# Patient Record
Sex: Female | Born: 1945 | Race: White | Hispanic: No | Marital: Married | Smoking: Never smoker
Health system: Southern US, Community
[De-identification: ages and names within clinical notes are randomized; demographics above are authoritative.]

## PROBLEM LIST (undated history)

## (undated) DIAGNOSIS — I519 Heart disease, unspecified: Secondary | ICD-10-CM

## (undated) DIAGNOSIS — F419 Anxiety disorder, unspecified: Secondary | ICD-10-CM

## (undated) DIAGNOSIS — E079 Disorder of thyroid, unspecified: Secondary | ICD-10-CM

## (undated) DIAGNOSIS — R519 Headache, unspecified: Secondary | ICD-10-CM

## (undated) DIAGNOSIS — Z90710 Acquired absence of both cervix and uterus: Secondary | ICD-10-CM

## (undated) DIAGNOSIS — I517 Cardiomegaly: Secondary | ICD-10-CM

## (undated) DIAGNOSIS — E7800 Pure hypercholesterolemia, unspecified: Secondary | ICD-10-CM

## (undated) DIAGNOSIS — M1990 Unspecified osteoarthritis, unspecified site: Secondary | ICD-10-CM

## (undated) DIAGNOSIS — R51 Headache: Secondary | ICD-10-CM

## (undated) DIAGNOSIS — I10 Essential (primary) hypertension: Secondary | ICD-10-CM

## (undated) DIAGNOSIS — K219 Gastro-esophageal reflux disease without esophagitis: Secondary | ICD-10-CM

## (undated) DIAGNOSIS — E0590 Thyrotoxicosis, unspecified without thyrotoxic crisis or storm: Secondary | ICD-10-CM

## (undated) DIAGNOSIS — E785 Hyperlipidemia, unspecified: Secondary | ICD-10-CM

## (undated) DIAGNOSIS — G43909 Migraine, unspecified, not intractable, without status migrainosus: Secondary | ICD-10-CM

## (undated) HISTORY — DX: Disorder of thyroid, unspecified: E07.9

## (undated) HISTORY — DX: Hyperlipidemia, unspecified: E78.5

## (undated) HISTORY — DX: Essential (primary) hypertension: I10

## (undated) HISTORY — DX: Headache: R51

## (undated) HISTORY — DX: Gastro-esophageal reflux disease without esophagitis: K21.9

## (undated) HISTORY — DX: Migraine, unspecified, not intractable, without status migrainosus: G43.909

## (undated) HISTORY — DX: Heart disease, unspecified: I51.9

## (undated) HISTORY — DX: Cardiomegaly: I51.7

## (undated) HISTORY — DX: Pure hypercholesterolemia, unspecified: E78.00

## (undated) HISTORY — DX: Anxiety disorder, unspecified: F41.9

## (undated) HISTORY — DX: Unspecified osteoarthritis, unspecified site: M19.90

## (undated) HISTORY — DX: Headache, unspecified: R51.9

---
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
## 1977-11-23 HISTORY — PX: ABDOMINAL HYSTERECTOMY: SHX81

## 1977-11-23 HISTORY — PX: VAGINAL HYSTERECTOMY: SUR661

## 2005-03-10 ENCOUNTER — Ambulatory Visit: Payer: Self-pay | Admitting: Internal Medicine

## 2005-04-28 ENCOUNTER — Ambulatory Visit: Payer: Self-pay | Admitting: Internal Medicine

## 2006-02-15 ENCOUNTER — Ambulatory Visit: Payer: Self-pay | Admitting: Surgery

## 2006-06-01 ENCOUNTER — Ambulatory Visit: Payer: Self-pay | Admitting: Internal Medicine

## 2007-06-03 ENCOUNTER — Ambulatory Visit: Payer: Self-pay | Admitting: Internal Medicine

## 2007-08-25 ENCOUNTER — Ambulatory Visit: Payer: Self-pay | Admitting: Internal Medicine

## 2007-12-19 ENCOUNTER — Ambulatory Visit: Payer: Self-pay | Admitting: Surgery

## 2008-04-09 ENCOUNTER — Ambulatory Visit: Payer: Self-pay | Admitting: Pulmonary Disease

## 2008-04-09 DIAGNOSIS — R05 Cough: Secondary | ICD-10-CM

## 2008-04-09 DIAGNOSIS — R059 Cough, unspecified: Secondary | ICD-10-CM | POA: Insufficient documentation

## 2008-04-09 DIAGNOSIS — E785 Hyperlipidemia, unspecified: Secondary | ICD-10-CM | POA: Insufficient documentation

## 2008-06-04 ENCOUNTER — Ambulatory Visit: Payer: Self-pay | Admitting: Internal Medicine

## 2008-06-20 LAB — HM DEXA SCAN: HM Dexa Scan: NORMAL

## 2009-04-08 ENCOUNTER — Ambulatory Visit: Payer: Self-pay | Admitting: Surgery

## 2009-09-04 ENCOUNTER — Ambulatory Visit: Payer: Self-pay | Admitting: Internal Medicine

## 2009-11-04 ENCOUNTER — Ambulatory Visit: Payer: Self-pay | Admitting: Gastroenterology

## 2010-10-20 ENCOUNTER — Ambulatory Visit: Payer: Self-pay | Admitting: Internal Medicine

## 2010-10-29 ENCOUNTER — Ambulatory Visit: Payer: Self-pay | Admitting: Internal Medicine

## 2011-06-21 LAB — HM COLONOSCOPY: HM Colonoscopy: NORMAL

## 2011-11-23 ENCOUNTER — Ambulatory Visit: Payer: Self-pay | Admitting: Obstetrics and Gynecology

## 2012-01-26 ENCOUNTER — Ambulatory Visit: Payer: Self-pay | Admitting: Specialist

## 2012-06-20 DIAGNOSIS — Z79899 Other long term (current) drug therapy: Secondary | ICD-10-CM | POA: Diagnosis not present

## 2012-06-20 DIAGNOSIS — E785 Hyperlipidemia, unspecified: Secondary | ICD-10-CM | POA: Diagnosis not present

## 2012-06-20 DIAGNOSIS — E559 Vitamin D deficiency, unspecified: Secondary | ICD-10-CM | POA: Diagnosis not present

## 2012-06-20 DIAGNOSIS — E039 Hypothyroidism, unspecified: Secondary | ICD-10-CM | POA: Diagnosis not present

## 2012-06-27 DIAGNOSIS — E785 Hyperlipidemia, unspecified: Secondary | ICD-10-CM | POA: Diagnosis not present

## 2012-06-27 DIAGNOSIS — N390 Urinary tract infection, site not specified: Secondary | ICD-10-CM | POA: Diagnosis not present

## 2012-06-27 DIAGNOSIS — I10 Essential (primary) hypertension: Secondary | ICD-10-CM | POA: Diagnosis not present

## 2012-06-27 DIAGNOSIS — E039 Hypothyroidism, unspecified: Secondary | ICD-10-CM | POA: Diagnosis not present

## 2012-08-24 DIAGNOSIS — Z23 Encounter for immunization: Secondary | ICD-10-CM | POA: Diagnosis not present

## 2012-10-17 DIAGNOSIS — D239 Other benign neoplasm of skin, unspecified: Secondary | ICD-10-CM | POA: Diagnosis not present

## 2012-10-17 DIAGNOSIS — B079 Viral wart, unspecified: Secondary | ICD-10-CM | POA: Diagnosis not present

## 2012-10-17 DIAGNOSIS — L821 Other seborrheic keratosis: Secondary | ICD-10-CM | POA: Diagnosis not present

## 2012-12-06 DIAGNOSIS — J209 Acute bronchitis, unspecified: Secondary | ICD-10-CM | POA: Diagnosis not present

## 2012-12-26 DIAGNOSIS — I10 Essential (primary) hypertension: Secondary | ICD-10-CM | POA: Diagnosis not present

## 2012-12-26 DIAGNOSIS — E039 Hypothyroidism, unspecified: Secondary | ICD-10-CM | POA: Diagnosis not present

## 2012-12-26 DIAGNOSIS — E785 Hyperlipidemia, unspecified: Secondary | ICD-10-CM | POA: Diagnosis not present

## 2012-12-26 DIAGNOSIS — R197 Diarrhea, unspecified: Secondary | ICD-10-CM | POA: Diagnosis not present

## 2012-12-28 ENCOUNTER — Ambulatory Visit: Payer: Self-pay

## 2012-12-28 DIAGNOSIS — Z1231 Encounter for screening mammogram for malignant neoplasm of breast: Secondary | ICD-10-CM | POA: Diagnosis not present

## 2013-02-13 DIAGNOSIS — L290 Pruritus ani: Secondary | ICD-10-CM | POA: Diagnosis not present

## 2013-02-13 DIAGNOSIS — Z1211 Encounter for screening for malignant neoplasm of colon: Secondary | ICD-10-CM | POA: Diagnosis not present

## 2013-02-21 ENCOUNTER — Ambulatory Visit: Payer: Self-pay | Admitting: Gastroenterology

## 2013-02-21 DIAGNOSIS — R21 Rash and other nonspecific skin eruption: Secondary | ICD-10-CM | POA: Diagnosis not present

## 2013-02-21 DIAGNOSIS — G4730 Sleep apnea, unspecified: Secondary | ICD-10-CM | POA: Diagnosis not present

## 2013-02-21 DIAGNOSIS — E039 Hypothyroidism, unspecified: Secondary | ICD-10-CM | POA: Diagnosis not present

## 2013-02-21 DIAGNOSIS — D510 Vitamin B12 deficiency anemia due to intrinsic factor deficiency: Secondary | ICD-10-CM | POA: Diagnosis not present

## 2013-02-21 DIAGNOSIS — K5732 Diverticulitis of large intestine without perforation or abscess without bleeding: Secondary | ICD-10-CM | POA: Diagnosis not present

## 2013-02-21 DIAGNOSIS — E785 Hyperlipidemia, unspecified: Secondary | ICD-10-CM | POA: Diagnosis not present

## 2013-02-21 DIAGNOSIS — K5730 Diverticulosis of large intestine without perforation or abscess without bleeding: Secondary | ICD-10-CM | POA: Diagnosis not present

## 2013-02-21 DIAGNOSIS — Z1211 Encounter for screening for malignant neoplasm of colon: Secondary | ICD-10-CM | POA: Diagnosis not present

## 2013-02-21 DIAGNOSIS — Q438 Other specified congenital malformations of intestine: Secondary | ICD-10-CM | POA: Diagnosis not present

## 2013-02-21 DIAGNOSIS — Z79899 Other long term (current) drug therapy: Secondary | ICD-10-CM | POA: Diagnosis not present

## 2013-02-21 DIAGNOSIS — I10 Essential (primary) hypertension: Secondary | ICD-10-CM | POA: Diagnosis not present

## 2013-04-03 DIAGNOSIS — B379 Candidiasis, unspecified: Secondary | ICD-10-CM | POA: Diagnosis not present

## 2013-05-15 DIAGNOSIS — H43819 Vitreous degeneration, unspecified eye: Secondary | ICD-10-CM | POA: Diagnosis not present

## 2013-05-15 DIAGNOSIS — H2510 Age-related nuclear cataract, unspecified eye: Secondary | ICD-10-CM | POA: Diagnosis not present

## 2013-06-20 LAB — HM MAMMOGRAPHY: HM Mammogram: NORMAL

## 2013-07-03 DIAGNOSIS — E785 Hyperlipidemia, unspecified: Secondary | ICD-10-CM | POA: Diagnosis not present

## 2013-07-03 DIAGNOSIS — I10 Essential (primary) hypertension: Secondary | ICD-10-CM | POA: Diagnosis not present

## 2013-07-03 DIAGNOSIS — E039 Hypothyroidism, unspecified: Secondary | ICD-10-CM | POA: Diagnosis not present

## 2013-07-03 DIAGNOSIS — Z0000 Encounter for general adult medical examination without abnormal findings: Secondary | ICD-10-CM | POA: Diagnosis not present

## 2013-07-10 DIAGNOSIS — E039 Hypothyroidism, unspecified: Secondary | ICD-10-CM | POA: Diagnosis not present

## 2013-07-10 DIAGNOSIS — Z0000 Encounter for general adult medical examination without abnormal findings: Secondary | ICD-10-CM | POA: Diagnosis not present

## 2013-07-10 DIAGNOSIS — E785 Hyperlipidemia, unspecified: Secondary | ICD-10-CM | POA: Diagnosis not present

## 2013-07-10 DIAGNOSIS — Z23 Encounter for immunization: Secondary | ICD-10-CM | POA: Diagnosis not present

## 2013-07-17 DIAGNOSIS — E8941 Symptomatic postprocedural ovarian failure: Secondary | ICD-10-CM | POA: Diagnosis not present

## 2013-08-16 DIAGNOSIS — L84 Corns and callosities: Secondary | ICD-10-CM | POA: Diagnosis not present

## 2013-08-16 DIAGNOSIS — L259 Unspecified contact dermatitis, unspecified cause: Secondary | ICD-10-CM | POA: Diagnosis not present

## 2013-08-16 DIAGNOSIS — Z419 Encounter for procedure for purposes other than remedying health state, unspecified: Secondary | ICD-10-CM | POA: Diagnosis not present

## 2013-09-06 DIAGNOSIS — Z23 Encounter for immunization: Secondary | ICD-10-CM | POA: Diagnosis not present

## 2013-10-02 DIAGNOSIS — E039 Hypothyroidism, unspecified: Secondary | ICD-10-CM | POA: Diagnosis not present

## 2013-10-02 DIAGNOSIS — E7800 Pure hypercholesterolemia, unspecified: Secondary | ICD-10-CM | POA: Diagnosis not present

## 2013-10-09 DIAGNOSIS — R059 Cough, unspecified: Secondary | ICD-10-CM | POA: Diagnosis not present

## 2013-10-09 DIAGNOSIS — F329 Major depressive disorder, single episode, unspecified: Secondary | ICD-10-CM | POA: Diagnosis not present

## 2013-10-09 DIAGNOSIS — E039 Hypothyroidism, unspecified: Secondary | ICD-10-CM | POA: Diagnosis not present

## 2013-10-09 DIAGNOSIS — R05 Cough: Secondary | ICD-10-CM | POA: Diagnosis not present

## 2013-10-09 DIAGNOSIS — E785 Hyperlipidemia, unspecified: Secondary | ICD-10-CM | POA: Diagnosis not present

## 2013-12-04 DIAGNOSIS — E039 Hypothyroidism, unspecified: Secondary | ICD-10-CM | POA: Diagnosis not present

## 2013-12-25 DIAGNOSIS — L909 Atrophic disorder of skin, unspecified: Secondary | ICD-10-CM | POA: Diagnosis not present

## 2013-12-25 DIAGNOSIS — L84 Corns and callosities: Secondary | ICD-10-CM | POA: Diagnosis not present

## 2013-12-25 DIAGNOSIS — Z1283 Encounter for screening for malignant neoplasm of skin: Secondary | ICD-10-CM | POA: Diagnosis not present

## 2013-12-25 DIAGNOSIS — L919 Hypertrophic disorder of the skin, unspecified: Secondary | ICD-10-CM | POA: Diagnosis not present

## 2013-12-25 DIAGNOSIS — L819 Disorder of pigmentation, unspecified: Secondary | ICD-10-CM | POA: Diagnosis not present

## 2014-01-17 DIAGNOSIS — E039 Hypothyroidism, unspecified: Secondary | ICD-10-CM | POA: Diagnosis not present

## 2014-02-14 DIAGNOSIS — I10 Essential (primary) hypertension: Secondary | ICD-10-CM | POA: Diagnosis not present

## 2014-02-14 DIAGNOSIS — E213 Hyperparathyroidism, unspecified: Secondary | ICD-10-CM | POA: Diagnosis not present

## 2014-03-08 DIAGNOSIS — E039 Hypothyroidism, unspecified: Secondary | ICD-10-CM | POA: Diagnosis not present

## 2014-03-08 DIAGNOSIS — N390 Urinary tract infection, site not specified: Secondary | ICD-10-CM | POA: Diagnosis not present

## 2014-03-26 DIAGNOSIS — E039 Hypothyroidism, unspecified: Secondary | ICD-10-CM | POA: Diagnosis not present

## 2014-04-02 DIAGNOSIS — M549 Dorsalgia, unspecified: Secondary | ICD-10-CM | POA: Diagnosis not present

## 2014-04-05 DIAGNOSIS — K5900 Constipation, unspecified: Secondary | ICD-10-CM | POA: Diagnosis not present

## 2014-04-05 DIAGNOSIS — M5137 Other intervertebral disc degeneration, lumbosacral region: Secondary | ICD-10-CM | POA: Diagnosis not present

## 2014-04-05 DIAGNOSIS — M5430 Sciatica, unspecified side: Secondary | ICD-10-CM | POA: Diagnosis not present

## 2014-04-10 DIAGNOSIS — M542 Cervicalgia: Secondary | ICD-10-CM | POA: Diagnosis not present

## 2014-04-10 DIAGNOSIS — M546 Pain in thoracic spine: Secondary | ICD-10-CM | POA: Diagnosis not present

## 2014-04-10 DIAGNOSIS — M545 Low back pain, unspecified: Secondary | ICD-10-CM | POA: Diagnosis not present

## 2014-04-10 DIAGNOSIS — M999 Biomechanical lesion, unspecified: Secondary | ICD-10-CM | POA: Diagnosis not present

## 2014-04-10 DIAGNOSIS — M5137 Other intervertebral disc degeneration, lumbosacral region: Secondary | ICD-10-CM | POA: Diagnosis not present

## 2014-04-10 DIAGNOSIS — M9981 Other biomechanical lesions of cervical region: Secondary | ICD-10-CM | POA: Diagnosis not present

## 2014-04-11 DIAGNOSIS — M545 Low back pain, unspecified: Secondary | ICD-10-CM | POA: Diagnosis not present

## 2014-04-11 DIAGNOSIS — M546 Pain in thoracic spine: Secondary | ICD-10-CM | POA: Diagnosis not present

## 2014-04-11 DIAGNOSIS — M542 Cervicalgia: Secondary | ICD-10-CM | POA: Diagnosis not present

## 2014-04-11 DIAGNOSIS — M5137 Other intervertebral disc degeneration, lumbosacral region: Secondary | ICD-10-CM | POA: Diagnosis not present

## 2014-04-11 DIAGNOSIS — M999 Biomechanical lesion, unspecified: Secondary | ICD-10-CM | POA: Diagnosis not present

## 2014-04-11 DIAGNOSIS — M9981 Other biomechanical lesions of cervical region: Secondary | ICD-10-CM | POA: Diagnosis not present

## 2014-04-13 DIAGNOSIS — M9981 Other biomechanical lesions of cervical region: Secondary | ICD-10-CM | POA: Diagnosis not present

## 2014-04-13 DIAGNOSIS — M542 Cervicalgia: Secondary | ICD-10-CM | POA: Diagnosis not present

## 2014-04-13 DIAGNOSIS — M999 Biomechanical lesion, unspecified: Secondary | ICD-10-CM | POA: Diagnosis not present

## 2014-04-13 DIAGNOSIS — M545 Low back pain, unspecified: Secondary | ICD-10-CM | POA: Diagnosis not present

## 2014-04-13 DIAGNOSIS — M546 Pain in thoracic spine: Secondary | ICD-10-CM | POA: Diagnosis not present

## 2014-04-13 DIAGNOSIS — M5137 Other intervertebral disc degeneration, lumbosacral region: Secondary | ICD-10-CM | POA: Diagnosis not present

## 2014-04-18 DIAGNOSIS — M999 Biomechanical lesion, unspecified: Secondary | ICD-10-CM | POA: Diagnosis not present

## 2014-04-18 DIAGNOSIS — M545 Low back pain, unspecified: Secondary | ICD-10-CM | POA: Diagnosis not present

## 2014-04-18 DIAGNOSIS — M546 Pain in thoracic spine: Secondary | ICD-10-CM | POA: Diagnosis not present

## 2014-04-18 DIAGNOSIS — M5137 Other intervertebral disc degeneration, lumbosacral region: Secondary | ICD-10-CM | POA: Diagnosis not present

## 2014-04-18 DIAGNOSIS — M9981 Other biomechanical lesions of cervical region: Secondary | ICD-10-CM | POA: Diagnosis not present

## 2014-04-18 DIAGNOSIS — M542 Cervicalgia: Secondary | ICD-10-CM | POA: Diagnosis not present

## 2014-04-20 DIAGNOSIS — M9981 Other biomechanical lesions of cervical region: Secondary | ICD-10-CM | POA: Diagnosis not present

## 2014-04-20 DIAGNOSIS — M545 Low back pain, unspecified: Secondary | ICD-10-CM | POA: Diagnosis not present

## 2014-04-20 DIAGNOSIS — M999 Biomechanical lesion, unspecified: Secondary | ICD-10-CM | POA: Diagnosis not present

## 2014-04-20 DIAGNOSIS — M5137 Other intervertebral disc degeneration, lumbosacral region: Secondary | ICD-10-CM | POA: Diagnosis not present

## 2014-04-20 DIAGNOSIS — M546 Pain in thoracic spine: Secondary | ICD-10-CM | POA: Diagnosis not present

## 2014-04-20 DIAGNOSIS — M542 Cervicalgia: Secondary | ICD-10-CM | POA: Diagnosis not present

## 2014-04-23 DIAGNOSIS — M545 Low back pain, unspecified: Secondary | ICD-10-CM | POA: Diagnosis not present

## 2014-04-23 DIAGNOSIS — M9981 Other biomechanical lesions of cervical region: Secondary | ICD-10-CM | POA: Diagnosis not present

## 2014-04-23 DIAGNOSIS — M5137 Other intervertebral disc degeneration, lumbosacral region: Secondary | ICD-10-CM | POA: Diagnosis not present

## 2014-04-23 DIAGNOSIS — M546 Pain in thoracic spine: Secondary | ICD-10-CM | POA: Diagnosis not present

## 2014-04-23 DIAGNOSIS — M999 Biomechanical lesion, unspecified: Secondary | ICD-10-CM | POA: Diagnosis not present

## 2014-04-23 DIAGNOSIS — M542 Cervicalgia: Secondary | ICD-10-CM | POA: Diagnosis not present

## 2014-04-25 DIAGNOSIS — M999 Biomechanical lesion, unspecified: Secondary | ICD-10-CM | POA: Diagnosis not present

## 2014-04-25 DIAGNOSIS — M9981 Other biomechanical lesions of cervical region: Secondary | ICD-10-CM | POA: Diagnosis not present

## 2014-04-25 DIAGNOSIS — M5137 Other intervertebral disc degeneration, lumbosacral region: Secondary | ICD-10-CM | POA: Diagnosis not present

## 2014-04-25 DIAGNOSIS — M545 Low back pain, unspecified: Secondary | ICD-10-CM | POA: Diagnosis not present

## 2014-04-25 DIAGNOSIS — M546 Pain in thoracic spine: Secondary | ICD-10-CM | POA: Diagnosis not present

## 2014-04-25 DIAGNOSIS — M542 Cervicalgia: Secondary | ICD-10-CM | POA: Diagnosis not present

## 2014-04-27 DIAGNOSIS — M545 Low back pain, unspecified: Secondary | ICD-10-CM | POA: Diagnosis not present

## 2014-04-27 DIAGNOSIS — M999 Biomechanical lesion, unspecified: Secondary | ICD-10-CM | POA: Diagnosis not present

## 2014-04-27 DIAGNOSIS — M9981 Other biomechanical lesions of cervical region: Secondary | ICD-10-CM | POA: Diagnosis not present

## 2014-04-27 DIAGNOSIS — M5137 Other intervertebral disc degeneration, lumbosacral region: Secondary | ICD-10-CM | POA: Diagnosis not present

## 2014-04-27 DIAGNOSIS — M546 Pain in thoracic spine: Secondary | ICD-10-CM | POA: Diagnosis not present

## 2014-04-27 DIAGNOSIS — M542 Cervicalgia: Secondary | ICD-10-CM | POA: Diagnosis not present

## 2014-04-30 DIAGNOSIS — M9981 Other biomechanical lesions of cervical region: Secondary | ICD-10-CM | POA: Diagnosis not present

## 2014-04-30 DIAGNOSIS — M999 Biomechanical lesion, unspecified: Secondary | ICD-10-CM | POA: Diagnosis not present

## 2014-04-30 DIAGNOSIS — M5137 Other intervertebral disc degeneration, lumbosacral region: Secondary | ICD-10-CM | POA: Diagnosis not present

## 2014-04-30 DIAGNOSIS — M545 Low back pain, unspecified: Secondary | ICD-10-CM | POA: Diagnosis not present

## 2014-04-30 DIAGNOSIS — M542 Cervicalgia: Secondary | ICD-10-CM | POA: Diagnosis not present

## 2014-04-30 DIAGNOSIS — M546 Pain in thoracic spine: Secondary | ICD-10-CM | POA: Diagnosis not present

## 2014-05-02 DIAGNOSIS — M545 Low back pain, unspecified: Secondary | ICD-10-CM | POA: Diagnosis not present

## 2014-05-02 DIAGNOSIS — M999 Biomechanical lesion, unspecified: Secondary | ICD-10-CM | POA: Diagnosis not present

## 2014-05-02 DIAGNOSIS — M546 Pain in thoracic spine: Secondary | ICD-10-CM | POA: Diagnosis not present

## 2014-05-02 DIAGNOSIS — M9981 Other biomechanical lesions of cervical region: Secondary | ICD-10-CM | POA: Diagnosis not present

## 2014-05-02 DIAGNOSIS — M542 Cervicalgia: Secondary | ICD-10-CM | POA: Diagnosis not present

## 2014-05-02 DIAGNOSIS — M5137 Other intervertebral disc degeneration, lumbosacral region: Secondary | ICD-10-CM | POA: Diagnosis not present

## 2014-05-04 DIAGNOSIS — M9981 Other biomechanical lesions of cervical region: Secondary | ICD-10-CM | POA: Diagnosis not present

## 2014-05-04 DIAGNOSIS — M545 Low back pain, unspecified: Secondary | ICD-10-CM | POA: Diagnosis not present

## 2014-05-04 DIAGNOSIS — M999 Biomechanical lesion, unspecified: Secondary | ICD-10-CM | POA: Diagnosis not present

## 2014-05-04 DIAGNOSIS — M5137 Other intervertebral disc degeneration, lumbosacral region: Secondary | ICD-10-CM | POA: Diagnosis not present

## 2014-05-04 DIAGNOSIS — M542 Cervicalgia: Secondary | ICD-10-CM | POA: Diagnosis not present

## 2014-05-04 DIAGNOSIS — M546 Pain in thoracic spine: Secondary | ICD-10-CM | POA: Diagnosis not present

## 2014-05-07 DIAGNOSIS — M546 Pain in thoracic spine: Secondary | ICD-10-CM | POA: Diagnosis not present

## 2014-05-07 DIAGNOSIS — M999 Biomechanical lesion, unspecified: Secondary | ICD-10-CM | POA: Diagnosis not present

## 2014-05-07 DIAGNOSIS — M545 Low back pain, unspecified: Secondary | ICD-10-CM | POA: Diagnosis not present

## 2014-05-07 DIAGNOSIS — M5137 Other intervertebral disc degeneration, lumbosacral region: Secondary | ICD-10-CM | POA: Diagnosis not present

## 2014-05-07 DIAGNOSIS — M542 Cervicalgia: Secondary | ICD-10-CM | POA: Diagnosis not present

## 2014-05-07 DIAGNOSIS — M9981 Other biomechanical lesions of cervical region: Secondary | ICD-10-CM | POA: Diagnosis not present

## 2014-05-09 DIAGNOSIS — M999 Biomechanical lesion, unspecified: Secondary | ICD-10-CM | POA: Diagnosis not present

## 2014-05-09 DIAGNOSIS — M545 Low back pain, unspecified: Secondary | ICD-10-CM | POA: Diagnosis not present

## 2014-05-09 DIAGNOSIS — M5137 Other intervertebral disc degeneration, lumbosacral region: Secondary | ICD-10-CM | POA: Diagnosis not present

## 2014-05-09 DIAGNOSIS — M546 Pain in thoracic spine: Secondary | ICD-10-CM | POA: Diagnosis not present

## 2014-05-09 DIAGNOSIS — M542 Cervicalgia: Secondary | ICD-10-CM | POA: Diagnosis not present

## 2014-05-09 DIAGNOSIS — M9981 Other biomechanical lesions of cervical region: Secondary | ICD-10-CM | POA: Diagnosis not present

## 2014-05-11 DIAGNOSIS — M999 Biomechanical lesion, unspecified: Secondary | ICD-10-CM | POA: Diagnosis not present

## 2014-05-11 DIAGNOSIS — M9981 Other biomechanical lesions of cervical region: Secondary | ICD-10-CM | POA: Diagnosis not present

## 2014-05-11 DIAGNOSIS — M5137 Other intervertebral disc degeneration, lumbosacral region: Secondary | ICD-10-CM | POA: Diagnosis not present

## 2014-05-11 DIAGNOSIS — M545 Low back pain, unspecified: Secondary | ICD-10-CM | POA: Diagnosis not present

## 2014-05-11 DIAGNOSIS — M542 Cervicalgia: Secondary | ICD-10-CM | POA: Diagnosis not present

## 2014-05-11 DIAGNOSIS — M546 Pain in thoracic spine: Secondary | ICD-10-CM | POA: Diagnosis not present

## 2014-05-14 DIAGNOSIS — M9981 Other biomechanical lesions of cervical region: Secondary | ICD-10-CM | POA: Diagnosis not present

## 2014-05-14 DIAGNOSIS — M545 Low back pain, unspecified: Secondary | ICD-10-CM | POA: Diagnosis not present

## 2014-05-14 DIAGNOSIS — M542 Cervicalgia: Secondary | ICD-10-CM | POA: Diagnosis not present

## 2014-05-14 DIAGNOSIS — M5137 Other intervertebral disc degeneration, lumbosacral region: Secondary | ICD-10-CM | POA: Diagnosis not present

## 2014-05-14 DIAGNOSIS — M999 Biomechanical lesion, unspecified: Secondary | ICD-10-CM | POA: Diagnosis not present

## 2014-05-14 DIAGNOSIS — M546 Pain in thoracic spine: Secondary | ICD-10-CM | POA: Diagnosis not present

## 2014-05-15 DIAGNOSIS — H00029 Hordeolum internum unspecified eye, unspecified eyelid: Secondary | ICD-10-CM | POA: Diagnosis not present

## 2014-05-16 DIAGNOSIS — M999 Biomechanical lesion, unspecified: Secondary | ICD-10-CM | POA: Diagnosis not present

## 2014-05-16 DIAGNOSIS — M545 Low back pain, unspecified: Secondary | ICD-10-CM | POA: Diagnosis not present

## 2014-05-16 DIAGNOSIS — M542 Cervicalgia: Secondary | ICD-10-CM | POA: Diagnosis not present

## 2014-05-16 DIAGNOSIS — M546 Pain in thoracic spine: Secondary | ICD-10-CM | POA: Diagnosis not present

## 2014-05-16 DIAGNOSIS — M9981 Other biomechanical lesions of cervical region: Secondary | ICD-10-CM | POA: Diagnosis not present

## 2014-05-16 DIAGNOSIS — M5137 Other intervertebral disc degeneration, lumbosacral region: Secondary | ICD-10-CM | POA: Diagnosis not present

## 2014-05-21 DIAGNOSIS — N390 Urinary tract infection, site not specified: Secondary | ICD-10-CM | POA: Diagnosis not present

## 2014-05-21 DIAGNOSIS — IMO0001 Reserved for inherently not codable concepts without codable children: Secondary | ICD-10-CM | POA: Diagnosis not present

## 2014-05-21 DIAGNOSIS — E039 Hypothyroidism, unspecified: Secondary | ICD-10-CM | POA: Diagnosis not present

## 2014-05-25 DIAGNOSIS — M5137 Other intervertebral disc degeneration, lumbosacral region: Secondary | ICD-10-CM | POA: Diagnosis not present

## 2014-05-25 DIAGNOSIS — M999 Biomechanical lesion, unspecified: Secondary | ICD-10-CM | POA: Diagnosis not present

## 2014-05-25 DIAGNOSIS — M9981 Other biomechanical lesions of cervical region: Secondary | ICD-10-CM | POA: Diagnosis not present

## 2014-05-25 DIAGNOSIS — M545 Low back pain, unspecified: Secondary | ICD-10-CM | POA: Diagnosis not present

## 2014-05-25 DIAGNOSIS — M546 Pain in thoracic spine: Secondary | ICD-10-CM | POA: Diagnosis not present

## 2014-05-25 DIAGNOSIS — M542 Cervicalgia: Secondary | ICD-10-CM | POA: Diagnosis not present

## 2014-05-28 ENCOUNTER — Ambulatory Visit: Payer: Self-pay | Admitting: Internal Medicine

## 2014-06-20 ENCOUNTER — Encounter: Payer: Self-pay | Admitting: Internal Medicine

## 2014-06-20 ENCOUNTER — Encounter (INDEPENDENT_AMBULATORY_CARE_PROVIDER_SITE_OTHER): Payer: Self-pay

## 2014-06-20 ENCOUNTER — Ambulatory Visit (INDEPENDENT_AMBULATORY_CARE_PROVIDER_SITE_OTHER): Payer: Medicare Other | Admitting: Internal Medicine

## 2014-06-20 VITALS — BP 148/76 | HR 66 | Temp 98.0°F | Resp 16 | Ht 66.5 in | Wt 217.0 lb

## 2014-06-20 DIAGNOSIS — E559 Vitamin D deficiency, unspecified: Secondary | ICD-10-CM | POA: Diagnosis not present

## 2014-06-20 DIAGNOSIS — R5383 Other fatigue: Secondary | ICD-10-CM

## 2014-06-20 DIAGNOSIS — E538 Deficiency of other specified B group vitamins: Secondary | ICD-10-CM

## 2014-06-20 DIAGNOSIS — E890 Postprocedural hypothyroidism: Secondary | ICD-10-CM

## 2014-06-20 DIAGNOSIS — E785 Hyperlipidemia, unspecified: Secondary | ICD-10-CM

## 2014-06-20 DIAGNOSIS — G4733 Obstructive sleep apnea (adult) (pediatric): Secondary | ICD-10-CM

## 2014-06-20 DIAGNOSIS — R5381 Other malaise: Secondary | ICD-10-CM

## 2014-06-20 DIAGNOSIS — E669 Obesity, unspecified: Secondary | ICD-10-CM

## 2014-06-20 DIAGNOSIS — Z1239 Encounter for other screening for malignant neoplasm of breast: Secondary | ICD-10-CM

## 2014-06-20 DIAGNOSIS — Z79899 Other long term (current) drug therapy: Secondary | ICD-10-CM | POA: Diagnosis not present

## 2014-06-20 DIAGNOSIS — E039 Hypothyroidism, unspecified: Secondary | ICD-10-CM

## 2014-06-20 LAB — VITAMIN D 25 HYDROXY (VIT D DEFICIENCY, FRACTURES): VITD: 22.29 ng/mL — ABNORMAL LOW (ref 30.00–100.00)

## 2014-06-20 LAB — COMPREHENSIVE METABOLIC PANEL
ALT: 23 U/L (ref 0–35)
AST: 21 U/L (ref 0–37)
Albumin: 4.2 g/dL (ref 3.5–5.2)
Alkaline Phosphatase: 67 U/L (ref 39–117)
BUN: 16 mg/dL (ref 6–23)
CALCIUM: 9.3 mg/dL (ref 8.4–10.5)
CHLORIDE: 101 meq/L (ref 96–112)
CO2: 27 meq/L (ref 19–32)
Creatinine, Ser: 0.7 mg/dL (ref 0.4–1.2)
GFR: 96.62 mL/min (ref >60.00)
Glucose, Bld: 94 mg/dL (ref 70–99)
POTASSIUM: 4.2 meq/L (ref 3.5–5.1)
SODIUM: 139 meq/L (ref 135–145)
TOTAL PROTEIN: 7.1 g/dL (ref 6.0–8.3)
Total Bilirubin: 1.3 mg/dL — ABNORMAL HIGH (ref 0.2–1.2)

## 2014-06-20 LAB — VITAMIN B12: VITAMIN B 12: 152 pg/mL — AB (ref 211–911)

## 2014-06-20 LAB — MAGNESIUM: MAGNESIUM: 1.8 mg/dL (ref 1.5–2.5)

## 2014-06-20 LAB — TSH: TSH: 0.22 u[IU]/mL — ABNORMAL LOW (ref 0.35–4.50)

## 2014-06-20 MED ORDER — ZOSTER VACCINE LIVE 19400 UNT/0.65ML ~~LOC~~ SOLR: 0.6500 mL | Freq: Once | SUBCUTANEOUS | Status: DC

## 2014-06-20 NOTE — Progress Notes (Signed)
Pre-visit discussion using our clinic review tool. No additional management support is needed unless otherwise documented below in the visit note.  

## 2014-06-20 NOTE — Patient Instructions (Signed)
It was such a pleasure meeting your today!!  We will contact you with your labs results in the next 48 hours  We will schedule you annual wellness exam in 3 months  This is  One version of a  "Low GI"  Diet:  It will still lower your blood sugars and allow you to lose 4 to 8  lbs  per month if you follow it carefully.  Your goal with exercise is a minimum of 30 minutes of aerobic exercise 5 days per week (Walking does not count once it becomes easy!)    All of the foods can be found at grocery stores and in bulk at [employer].  The Atkins protein bars and shakes are available in more varieties at [employer], [employer] and [employer].     7 AM Breakfast:  Choose from the following:  Low carbohydrate Protein  Shakes (I recommend the EAS AdvantEdge "Carb Control" shakes  Or the low carb shakes by Atkins.    2.5 carbs   Arnold's "Sandwhich Thin"toasted  w/ peanut butter (no jelly: about 20 net carbs  "Bagel Thin" with cream cheese and salmon: about 20 carbs   a scrambled egg/bacon/cheese burrito made with Mission's "carb balance" whole wheat tortilla  (about 10 net carbs )   Avoid cereal and bananas, oatmeal and cream of wheat and grits. They are loaded with carbohydrates!   10 AM: high protein snack  Protein bar by Atkins (the snack size, under 200 cal, usually < 6 net carbs).    A stick of cheese:  Around 1 carb,  100 cal     Dannon Light n Fit [place] Yogurt  (80 cal, 8 carbs)  Other so called "protein bars" and Greek yogurts tend to be loaded with carbohydrates.  Remember, in food advertising, the word "energy" is synonymous for " carbohydrate."  Lunch:   A Sandwich using the bread choices listed, Can use any  Eggs,  lunchmeat, grilled meat or canned tuna), avocado, regular mayo/mustard  and cheese.  A Salad using blue cheese, ranch,  Goddess or vinagrette,  No croutons or "confetti" and no "candied nuts" but regular nuts OK.   No pretzels or chips.  Pickles and miniature sweet peppers are a  good low carb alternative that provide a "crunch"  The bread is the only source of carbohydrate in a sandwich and  can be decreased by trying some of these alternatives to traditional loaf bread  Joseph's makes a pita bread and a flat bread that are 50 cal and 4 net carbs available at [place] and [place].  This can be toasted to use with hummous as well  Toufayan makes a low carb flatbread that's 100 cal and 9 net carbs available at [employer] and [employer]J's makes 2 sizes of  Low carb whole wheat tortilla  (The large one is 210 cal and 6 net carbs) Avoid "Low fat dressings, as well as Barry Brunner and Wynne dressings They are loaded with sugar!   3 PM/ Mid day  Snack:  Consider  1 ounce of  almonds, walnuts, pistachios, pecans, peanuts,  Macadamia nuts or a nut medley.  Avoid "granola"; the dried cranberries and raisins are loaded with carbohydrates. Mixed nuts as long as there are no raisins,  cranberries or dried fruit.    Try the prosciutto/mozzarella cheese sticks by Fiorruci  In deli /backery section   High protein      6 PM  Dinner:  Meat/fowl/fish with a green salad, and either broccoli, cauliflower, green beans, spinach, brussel sprouts or  Lima beans. DO NOT BREAD THE PROTEIN!!      There is a low carb pasta by Dreamfield's that is acceptable and tastes great: only 5 digestible carbs/serving.( All grocery stores but BJs carry it )  Try Hurley Cisco Angelo's chicken piccata or chicken or eggplant parm over low carb pasta.(Lowes and BJs)   Marjory Lies Sanchez's "Carnitas" (pulled pork, no sauce,  0 carbs) or his beef pot roast to make a dinner burrito (at BJ's)  Pesto over low carb pasta (bj's sells a good quality pesto in the center refrigerated section of the deli   Try satueeing  Cheral Marker with mushroooms  Whole wheat pasta is still full of digestible carbs and  Not as low in glycemic index as Dreamfield's.   Brown rice is still rice,  So skip the rice and noodles if you eat [place] or  [place] (or at least limit to 1/2 cup)  9 PM snack :   Breyer's "low carb" fudgsicle or  ice cream bar (Carb Smart line), or  Weight Watcher's ice cream bar , or another "no sugar added" ice cream;  a serving of fresh berries/cherries with whipped cream   Cheese or DANNON'S LlGHT N FIT G[place] YOGURT  8 ounces of [place] unsweetened almond/cococunut milk    Avoid bananas, pineapple, grapes  and watermelon on a regular basis because they are high in sugar.  THINK OF THEM AS DESSERT  Remember that snack Substitutions should be less than 10 NET carbs per serving and meals < 20 carbs. Remember to subtract fiber grams to get the "net carbs."

## 2014-06-20 NOTE — Progress Notes (Signed)
Patient ID: Julie Perez, female   DOB: 04/15/1946, 68 y.o.   MRN: [id number]   Patient Active Problem List   Diagnosis Date Noted  . B12 deficiency 06/22/2014  . Unspecified vitamin D deficiency 06/22/2014  . Other malaise and fatigue 06/22/2014  . Obesity, unspecified 06/22/2014  . OSA (obstructive sleep apnea) 06/22/2014  . 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 HYPERLIPIDEMIA 04/09/2008  . COUGH 04/09/2008    Subjective:  CC:   Chief Complaint  Patient presents with  . Establish Care    HPI:   Julie K Piersonis a 68 y.o. female who presents  With the cc of   Fatigue: Feels tired and exhausted all of the time.  Denies dyspnea, orthopnea, chest pain with exertion. Multiple contributing factors.    Thyroid problems. For the past  6 months her dose has been changed repeatedly.  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  Has been using 150 mcg daily of generic Synthroid ( Abbott/Abbvie)   For the past 6 weeks.  Has had dose changed 3 times in the last 6 months.     Weight gain:  Has gained 30 to 40 lbs in one year. Joined  [employer] a year ago but didn't start going until yesterday .  Diet is inconsistent ,  Avoids fast food  And sodas.      Sciatica ,  Right sided.  Seeing Dr Dumayne for chiropractic manipulation which is helping.  Has used aleve and ibuprofen prn . No falls, foot drop or trouble rising from chairs.   Occasional insomnia:  worrying about children dtr dxd with melanoma,   Son getting a divorce   Lives in [place]  Dtr in [place].   No history of domestic violence.  Currently remarried after 30 yrs ,  Wishes she hadn't , prefers living alone.  Feels that her current husband very needy despite 12 years of companionship.   Depressive disorder: Has been taking citalopram 40 mg daily for years. No history of hospitalizations or suicidality  Has OSA diagnosed years ago , but has not used CPAP due to intolerance  for face mask .    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  Had surgery for CT on right wrist remotely.    Past Medical History  Diagnosis Date  . Frequent headaches     stress  related  . Hyperlipidemia   . Thyroid disease   . Migraines      No Known Allergies   Past Surgical History  Procedure Laterality Date  . Abdominal hysterectomy  1979    History   Social History  . Marital Status: Married    Spouse Name: N/A    Number of Children: N/A  . Years of Education: N/A   Occupational History  . Not on file.   Social History Main Topics  . Smoking status: Never Smoker   . Smokeless tobacco: Never Used  . Alcohol Use: Yes     Comment: occasionally  . Drug Use: No  . Sexual Activity: No   Other Topics Concern  . Not on file   Social History Narrative  . No narrative on file   Family History  Problem Relation Age of Onset  . Hypertension Mother   . Cancer Father 63    prostate cancer  . Cancer Sister 76    breast cancer  . Diabetes Brother       type 2  . Heart disease Sister     cabg x 4, tobacco abuse  . Diabetes Sister     type 2  . Aneurysm Sister         Review of Systems:   The rest of the review of systems was negative except those addressed in the HPI.      Objective:  BP 148/76  Pulse 66  Temp(Src) 98 F (36.7 C) (Oral)  Resp 16  Ht 5' 6.5" (1.689 m)  Wt 217 lb (98.431 kg)  BMI 34.50 kg/m2  SpO2 96%  General appearance: alert, cooperative and appears stated age Ears: normal TM's and external ear canals both ears Throat: lips, mucosa, and tongue normal; teeth and gums normal Neck: no adenopathy, no carotid bruit, supple, symmetrical, trachea midline and thyroid not enlarged, symmetric, no tenderness/mass/nodules Back: symmetric, no curvature. ROM normal. No CVA tenderness. Lungs: clear to auscultation bilaterally Heart: regular rate and rhythm, S1, S2 normal, no murmur, click, rub or  gallop Abdomen: soft, non-tender; bowel sounds normal; no masses,  no organomegaly Pulses: 2+ and symmetric Skin: Skin color, texture, turgor normal. No rashes or lesions Lymph nodes: Cervical, supraclavicular, and axillary nodes normal.  Assessment and Plan:  Hypothyroidism, postradioiodine therapy Thyroid function is overactive on current dose.  I will send  a lower dose of brand name Synthroid to patient's  pharmacy and would like patient to change immediately. We will need to recheck her thyroid function after a minimum of 6 weeks post medication change,    B12 deficiency Diagnosed today due to complaints of fatigue. Advised her  come in for b12 injections daily for 3 days,  Weekly for 4 weekly , them monthly.  She can learn how to do them herself, .   Unspecified vitamin D deficiency  I am calling in a megadose of Vit D to take once weekly for a total of 3 months,  Followed by OTC  Vit D3 supplement 1000 units daily.    Other malaise and fatigue Multifactorial with B12 deficiency,  Overactive thyroid , untreated OSA and obesity contributing.  All issues addressed,  may also consider chagning lexapro to wellbutrin at next visit.   Obesity, unspecified I have addressed  BMI and recommended wt loss of 10% of body weight over the next 6 months using a low glycemic index diet and regular exercise a minimum of 5 days per week.    HYPERLIPIDEMIA Managed with statin therapy.  LFTs normal,  Will have nonfasting labs done prior to next visit Lab Results  Component Value Date   ALT 23 06/20/2014   AST 21 06/20/2014   ALKPHOS 67 06/20/2014   BILITOT 1.3* 06/20/2014     No results found for this basename: CHOL, HDL, LDLCALC, LDLDIRECT, TRIG, CHOLHDL     OSA (obstructive sleep apnea) diagnosed with prior sleep study but treatment has been deferred d by patient.  Discussed the long term history of OSA , the risks of long term damage to heart and the signs and symptoms attributable to OSA.   Advised patient to consider  significant weight loss and/or use of CPAP.   A total of 60  minutes was spent with patient more than half of which was spent in counseling, reviewing records from other prviders and coordination of care.  Updated Medication List Outpatient Encounter Prescriptions as of 06/20/2014  Medication Sig  . atorvastatin (LIPITOR) 80 MG tablet Take 1 tablet by mouth Nightly.  . citalopram (CELEXA) 40 MG tablet  Take 1 tablet by mouth daily.  . cyanocobalamin (,VITAMIN B-12,) 1000 MCG/ML injection 1 ml daily x 3, then weekly x 4, then monthly  . ergocalciferol (DRISDOL) 50000 UNITS capsule Take 1 capsule (50,000 Units total) by mouth once a week.  . levothyroxine (SYNTHROID) 137 MCG tablet Take 1 tablet (137 mcg total) by mouth daily before breakfast.  . Syringe, Disposable, 1 ML MISC For B12 administration  . zoster vaccine live, PF, (ZOSTAVAX) 19400 UNT/0.65ML injection Inject 19,400 Units into the skin once.  . [DISCONTINUED] levothyroxine (SYNTHROID, LEVOTHROID) 150 MCG tablet Take 1 tablet by mouth daily before breakfast.     Orders Placed This Encounter  Procedures  . MM DIGITAL SCREENING BILATERAL  . HM MAMMOGRAPHY  . HM DEXA SCAN  . TSH  . Vit D  25 hydroxy (rtn osteoporosis monitoring)  . B12  . Magnesium  . Comp Met (CMET)  . TSH  . B12  . Folate RBC  . Lipid panel  . HM COLONOSCOPY    Return in about 3 months (around 09/20/2014).       

## 2014-06-22 DIAGNOSIS — R5383 Other fatigue: Secondary | ICD-10-CM

## 2014-06-22 DIAGNOSIS — E669 Obesity, unspecified: Secondary | ICD-10-CM | POA: Insufficient documentation

## 2014-06-22 DIAGNOSIS — G4733 Obstructive sleep apnea (adult) (pediatric): Secondary | ICD-10-CM | POA: Insufficient documentation

## 2014-06-22 DIAGNOSIS — R5381 Other malaise: Secondary | ICD-10-CM | POA: Insufficient documentation

## 2014-06-22 DIAGNOSIS — E538 Deficiency of other specified B group vitamins: Secondary | ICD-10-CM | POA: Insufficient documentation

## 2014-06-22 DIAGNOSIS — E559 Vitamin D deficiency, unspecified: Secondary | ICD-10-CM | POA: Insufficient documentation

## 2014-06-22 MED ORDER — LEVOTHYROXINE SODIUM 137 MCG PO TABS
137.0000 ug | ORAL_TABLET | Freq: Every day | ORAL | Status: DC
Start: 2014-06-22 — End: 2014-08-01

## 2014-06-22 MED ORDER — ERGOCALCIFEROL 1.25 MG (50000 UT) PO CAPS
50000.0000 [IU] | ORAL_CAPSULE | ORAL | Status: DC
Start: 2014-06-22 — End: 2014-09-19

## 2014-06-22 MED ORDER — SYRINGE (DISPOSABLE) 1 ML MISC: Status: DC

## 2014-06-22 MED ORDER — CYANOCOBALAMIN 1000 MCG/ML IJ SOLN: INTRAMUSCULAR | Status: DC

## 2014-06-22 NOTE — Assessment & Plan Note (Signed)
diagnosed with prior sleep study but treatment has been deferred d by patient.  Discussed the long term history of OSA , the risks of long term damage to heart and the signs and symptoms attributable to OSA.  Advised patient to consider  significant weight loss and/or use of CPAP.  

## 2014-06-22 NOTE — Assessment & Plan Note (Signed)
Multifactorial with B12 deficiency,  Overactive thyroid , untreated OSA and obesity contributing.  All issues addressed,  may also consider chagning lexapro to wellbutrin at next visit.

## 2014-06-22 NOTE — Assessment & Plan Note (Signed)
Diagnosed today due to complaints of fatigue. Advised her  come in for b12 injections daily for 3 days,  Weekly for 4 weekly , them monthly.  She can learn how to do them herself, .

## 2014-06-22 NOTE — Assessment & Plan Note (Signed)
I have addressed  BMI and recommended wt loss of 10% of body weight over the next 6 months using a low glycemic index diet and regular exercise a minimum of 5 days per week.   

## 2014-06-22 NOTE — Assessment & Plan Note (Signed)
I am calling in a megadose of Vit D to take once weekly for a total of 3 months,  Followed by OTC  Vit D3 supplement 1000 units daily.

## 2014-06-22 NOTE — Assessment & Plan Note (Signed)
Managed with statin therapy.  LFTs normal,  Will have nonfasting labs done prior to next visit Lab Results  Component Value Date   ALT 23 06/20/2014   AST 21 06/20/2014   ALKPHOS 67 06/20/2014   BILITOT 1.3* 06/20/2014     No results found for this basename: CHOL, HDL, LDLCALC, LDLDIRECT, TRIG, CHOLHDL

## 2014-06-22 NOTE — Assessment & Plan Note (Addendum)
Thyroid function is overactive on current dose.  I will send  a lower dose of brand name Synthroid to patient's  pharmacy and would like patient to change immediately. We will need to recheck her thyroid function after a minimum of 6 weeks post medication change,

## 2014-06-26 ENCOUNTER — Ambulatory Visit (INDEPENDENT_AMBULATORY_CARE_PROVIDER_SITE_OTHER): Payer: Medicare Other | Admitting: *Deleted

## 2014-06-26 DIAGNOSIS — E538 Deficiency of other specified B group vitamins: Secondary | ICD-10-CM | POA: Diagnosis not present

## 2014-06-26 MED ORDER — CYANOCOBALAMIN 1000 MCG/ML IJ SOLN
1000.0000 ug | Freq: Once | INTRAMUSCULAR | Status: AC
  Administered 2014-06-26: 1000 ug via INTRAMUSCULAR

## 2014-07-31 ENCOUNTER — Ambulatory Visit (INDEPENDENT_AMBULATORY_CARE_PROVIDER_SITE_OTHER): Payer: Medicare Other | Admitting: *Deleted

## 2014-07-31 ENCOUNTER — Other Ambulatory Visit (INDEPENDENT_AMBULATORY_CARE_PROVIDER_SITE_OTHER): Payer: Medicare Other

## 2014-07-31 DIAGNOSIS — E785 Hyperlipidemia, unspecified: Secondary | ICD-10-CM

## 2014-07-31 DIAGNOSIS — E039 Hypothyroidism, unspecified: Secondary | ICD-10-CM

## 2014-07-31 DIAGNOSIS — E890 Postprocedural hypothyroidism: Secondary | ICD-10-CM | POA: Diagnosis not present

## 2014-07-31 DIAGNOSIS — E538 Deficiency of other specified B group vitamins: Secondary | ICD-10-CM

## 2014-07-31 LAB — LDL CHOLESTEROL, DIRECT: Direct LDL: 102.5 mg/dL

## 2014-07-31 LAB — LIPID PANEL
Cholesterol: 163 mg/dL (ref 0–200)
HDL: 42.3 mg/dL (ref >39.00)
NONHDL: 120.7
TRIGLYCERIDES: 206 mg/dL — AB (ref 0.0–149.0)
Total CHOL/HDL Ratio: 4
VLDL: 41.2 mg/dL — ABNORMAL HIGH (ref 0.0–40.0)

## 2014-07-31 LAB — TSH: TSH: 0.09 u[IU]/mL — AB (ref 0.35–4.50)

## 2014-07-31 LAB — VITAMIN B12: Vitamin B-12: >1500 pg/mL — ABNORMAL HIGH (ref 211–911)

## 2014-07-31 MED ORDER — CYANOCOBALAMIN 1000 MCG/ML IJ SOLN
1000.0000 ug | Freq: Once | INTRAMUSCULAR | Status: AC
  Administered 2014-07-31: 1000 ug via INTRAMUSCULAR

## 2014-08-01 ENCOUNTER — Other Ambulatory Visit: Payer: Medicare Other

## 2014-08-01 LAB — FOLATE RBC: RBC Folate: 930 ng/mL (ref >280)

## 2014-08-01 NOTE — Assessment & Plan Note (Signed)
Thyroid function is even more overactive on lesser  dose.  I have stopped all thyroid supplementation,  repeat tsh and free t4 in 6 weeks

## 2014-08-01 NOTE — Addendum Note (Signed)
Addended by: Crecencio Mc on: 08/01/2014 07:16 AM   Modules accepted: Orders, Medications

## 2014-08-02 ENCOUNTER — Other Ambulatory Visit: Payer: Medicare Other

## 2014-08-30 ENCOUNTER — Ambulatory Visit (INDEPENDENT_AMBULATORY_CARE_PROVIDER_SITE_OTHER): Payer: Medicare Other | Admitting: *Deleted

## 2014-08-30 DIAGNOSIS — E538 Deficiency of other specified B group vitamins: Secondary | ICD-10-CM | POA: Diagnosis not present

## 2014-08-30 MED ORDER — CYANOCOBALAMIN 1000 MCG/ML IJ SOLN
1000.0000 ug | Freq: Once | INTRAMUSCULAR | Status: AC
  Administered 2014-08-30: 1000 ug via INTRAMUSCULAR

## 2014-09-03 ENCOUNTER — Other Ambulatory Visit: Payer: Medicare Other

## 2014-09-03 ENCOUNTER — Telehealth: Payer: Self-pay | Admitting: *Deleted

## 2014-09-03 NOTE — Telephone Encounter (Signed)
Spoke with pt, scheduled for 10.13.15 at 2:15pm

## 2014-09-03 NOTE — Telephone Encounter (Signed)
HAVE HER come in for a TSH and free T4 this week instead of waiting 2 more weeks

## 2014-09-03 NOTE — Telephone Encounter (Signed)
Pt called states since being taken off Synthroid 4 weeks ago she has had continual headaches, eye puffiness, and nausea.  Please advised

## 2014-09-04 ENCOUNTER — Other Ambulatory Visit (INDEPENDENT_AMBULATORY_CARE_PROVIDER_SITE_OTHER): Payer: Medicare Other

## 2014-09-04 DIAGNOSIS — E890 Postprocedural hypothyroidism: Secondary | ICD-10-CM | POA: Diagnosis not present

## 2014-09-05 LAB — T4 AND TSH: TSH: 70.63 u[IU]/mL — ABNORMAL HIGH (ref 0.450–4.500)

## 2014-09-06 ENCOUNTER — Telehealth: Payer: Self-pay | Admitting: Internal Medicine

## 2014-09-06 MED ORDER — LEVOTHYROXINE SODIUM 125 MCG PO TABS: 125.0000 ug | ORAL_TABLET | Freq: Every day | ORAL | Status: DC

## 2014-09-06 NOTE — Addendum Note (Signed)
Addended by: Crecencio Mc on: 09/06/2014 02:52 PM   Modules accepted: Orders

## 2014-09-06 NOTE — Telephone Encounter (Signed)
Pt aware.

## 2014-09-06 NOTE — Telephone Encounter (Signed)
See results note,  Needs to resume  Lower dose of synthroid, sent to pharmacy,  Please schedule lab visit 6 weeks for repeat tsh and free t4

## 2014-09-06 NOTE — Telephone Encounter (Signed)
Patient called concerning labs done on Tuesday Please advise. T4 and TSH

## 2014-09-12 ENCOUNTER — Ambulatory Visit (INDEPENDENT_AMBULATORY_CARE_PROVIDER_SITE_OTHER): Payer: Medicare Other | Admitting: Internal Medicine

## 2014-09-12 ENCOUNTER — Encounter: Payer: Self-pay | Admitting: Internal Medicine

## 2014-09-12 VITALS — BP 146/88 | HR 57 | Temp 98.3°F | Resp 16 | Ht 66.5 in | Wt 221.8 lb

## 2014-09-12 DIAGNOSIS — Z0000 Encounter for general adult medical examination without abnormal findings: Secondary | ICD-10-CM | POA: Diagnosis not present

## 2014-09-12 DIAGNOSIS — E538 Deficiency of other specified B group vitamins: Secondary | ICD-10-CM

## 2014-09-12 DIAGNOSIS — E890 Postprocedural hypothyroidism: Secondary | ICD-10-CM | POA: Diagnosis not present

## 2014-09-12 DIAGNOSIS — Z90710 Acquired absence of both cervix and uterus: Secondary | ICD-10-CM

## 2014-09-12 DIAGNOSIS — F411 Generalized anxiety disorder: Secondary | ICD-10-CM

## 2014-09-12 DIAGNOSIS — E032 Hypothyroidism due to medicaments and other exogenous substances: Secondary | ICD-10-CM | POA: Diagnosis not present

## 2014-09-12 DIAGNOSIS — E669 Obesity, unspecified: Secondary | ICD-10-CM

## 2014-09-12 DIAGNOSIS — N949 Unspecified condition associated with female genital organs and menstrual cycle: Secondary | ICD-10-CM | POA: Diagnosis not present

## 2014-09-12 DIAGNOSIS — Z23 Encounter for immunization: Secondary | ICD-10-CM

## 2014-09-12 DIAGNOSIS — N952 Postmenopausal atrophic vaginitis: Secondary | ICD-10-CM

## 2014-09-12 DIAGNOSIS — G4733 Obstructive sleep apnea (adult) (pediatric): Secondary | ICD-10-CM

## 2014-09-12 HISTORY — DX: Acquired absence of both cervix and uterus: Z90.710

## 2014-09-12 MED ORDER — ALPRAZOLAM 0.25 MG PO TABS: 0.2500 mg | ORAL_TABLET | Freq: Every evening | ORAL | Status: DC | PRN

## 2014-09-12 NOTE — Progress Notes (Signed)
Patient ID: Julie Perez, female   DOB: 02-14-1946, 68 y.o.   MRN: [id number]  Subjective:    The patient is here for annual Medicare wellness examination and management of other chronic and acute problems:    Last seen in July as new patient with multiple complaints.  Labs at that time indicated supratherapeutic TSH due to medication, B12 and vitamin D deficiency which were diagnosed and  Addressed.   Her thyroid dose was reduced and eventually stopped due to increasingly overactive levels .  She recently resumed thyroid medication after a 4 weeks suspension due to overt signs of very underactive thyroid including extreme fatigue.  Marland Kitchen  TSH was 70.  Next TSH is  due end of November.   Lipids:  She has  been taking statin for a year without side effects  Having insomnia 3 to4 nights per week.  Using celexa for GAD.   S/p Hysterectomy:  No cervix but has not had a pelvic exam and several years and has been having dyspareunia due to vaginal dryness.    She has had a recurrent blistering lesion perianal ,area with multiple evaluations by prior PCP and GYN with no etiology found.    Her last colonoscopy was in 2012 at [facility] by Loistine Simas follow up is unclear no polyps and no FH       The risk factors are reflected in the social history.  The roster of all physicians providing medical care to patient - is listed in the Snapshot section of the chart.  Activities of daily living:  The patient is 100% independent in all ADLs: dressing, toileting, feeding as well as independent mobility  Home safety : The patient has smoke detectors in the home. They wear seatbelts.  There are no firearms at home. There is no violence in the home.   There is no risks for hepatitis, STDs or HIV. There is no   history of blood transfusion. They have no travel history to infectious disease endemic areas of the world.  The patient has seen their dentist in the last six month. They have seen their eye doctor in  the last year. They admit to slight hearing difficulty with regard to whispered voices and some television programs.  They have deferred audiologic testing in the last year.  They do not  have excessive sun exposure. Discussed the need for sun protection: hats, long sleeves and use of sunscreen if there is significant sun exposure.   Diet: the importance of a healthy diet is discussed. They do have a healthy diet.  The benefits of regular aerobic exercise were discussed. She walks 4 times per week ,  20 minutes.   Depression screen: there are no signs or vegative symptoms of depression- irritability, change in appetite, anhedonia, sadness/tearfullness.  Cognitive assessment: the patient manages all their financial and personal affairs and is actively engaged. They could relate day,date,year and events; recalled 2/3 objects at 3 minutes; performed clock-face test normally.  The following portions of the patient's history were reviewed and updated as appropriate: allergies, current medications, past family history, past medical history,  past surgical history, past social history  and problem list.  Visual acuity was not assessed per patient preference since she has regular follow up with her ophthalmologist. Hearing and body mass index were assessed and reviewed.   During the course of the visit the patient was educated and counseled about appropriate screening and preventive services including : fall prevention , diabetes screening, nutrition  counseling, colorectal cancer screening, and recommended immunizations.   Health Maintenance  Topic Date Due  . Tetanus/tdap  06/06/1966  . Mammogram  06/21/2015  . Influenza Vaccine  06/24/2015  . Colonoscopy  06/20/2021  . Pneumococcal Polysaccharide Vaccine Age 55 And Over  Completed  . Zostavax  Completed    The following portions of the patient's history were reviewed and updated as appropriate: allergies, current medications, past family history,  past medical history, past social history, past surgical history and problem list.  Review of Systems A comprehensive review of systems was negative except for: Genitourinary: positive for sexual problems Endocrine: positive for excessive fatigue   Objective:   BP 146/88  Pulse 57  Temp(Src) 98.3 F (36.8 C) (Oral)  Resp 16  Ht 5' 6.5" (1.689 m)  Wt 221 lb 12 oz (100.585 kg)  BMI 35.26 kg/m2  SpO2 96%  General Appearance:    Alert, cooperative, no distress, appears stated age  Head:    Normocephalic, without obvious abnormality, atraumatic  Eyes:    PERRL, conjunctiva/corneas clear, EOM's intact, fundi    benign, both eyes.  Suborbital edema noted.   Ears:    Normal TM's and external ear canals, both ears  Nose:   Nares normal, septum midline, mucosa normal, no drainage    or sinus tenderness  Throat:   Lips, mucosa, and tongue normal; teeth and gums normal  Neck:   Supple, symmetrical, trachea midline, no adenopathy;    thyroid:  no enlargement/tenderness/nodules; no carotid   bruit or JVD  Back:     Symmetric, no curvature, ROM normal, no CVA tenderness  Lungs:     Clear to auscultation bilaterally, respirations unlabored  Chest Wall:    No tenderness or deformity   Heart:    Regular rate and rhythm, S1 and S2 normal, no murmur, rub   or gallop  Breast Exam:    No tenderness, masses, or nipple abnormality  Abdomen:     Soft, non-tender, bowel sounds active all four quadrants,    no masses, no organomegaly  Genitalia:    Normal female without lesion, discharge or tenderness. vaginal vault noted to be pale, no discharge.   Rectal:    Deferred  Extremities:   Extremities normal, atraumatic, no cyanosis or edema  Pulses:   2+ and symmetric all extremities  Skin:   Skin color, texture, turgor normal, no rashes or lesions  Lymph nodes:   Cervical, supraclavicular, and axillary nodes normal  Neurologic:   CNII-XII intact, normal strength, sensation and reflexes    throughout     Assessment and Plan:    Hypothyroidism, postradioiodine therapy Levothyroxine has been resumed after 4 weeks of suspension for increasingly suppressed TSH despite serial reductions in dose. Resumed supplementation at 125 mcg due to development of severe symptoms.  Will repeat  TSH and Free t4 in late November.  Lab Results  Component Value Date   TSH 70.630* 09/04/2014     B12 deficiency Diagnosed in July,  Now on monthly home injections   Postmenopausal atrophic vaginitis Discussed the cause of atrophic vaginitis  and potential treatments,  Both hormonal and nonhormonal.  She prefers to try OTC lubricants first.   Obesity I have addressed  BMI and recommended wt loss of 10% of body weight, or 22 lbs,  over the next 6 months using a low glycemic index diet and regular exercise a minimum of 5 days per week.    Encounter for Medicare annual wellness exam Annual  Medicare wellness  exam was done as well as a comprehensive physical exam and management of acute and chronic conditions .  During the course of the visit the patient was educated and counseled about appropriate screening and preventive services including : fall prevention , diabetes screening, nutrition counseling, colorectal cancer screening, and recommended immunizations.  Printed recommendations for health maintenance screenings was given.   Genital lesion, female Not presently currently,  Prior workup unclear,  Will screen for herpes with serologies  Generalized anxiety disorder Managed with citalopram.  Adding alprazolam for periodic insomnia with early morning wakening.     Updated Medication List Outpatient Encounter Prescriptions as of 09/12/2014  Medication Sig  . atorvastatin (LIPITOR) 80 MG tablet Take 1 tablet by mouth Nightly.  . citalopram (CELEXA) 40 MG tablet Take 1 tablet by mouth daily.  . cyanocobalamin (,VITAMIN B-12,) 1000 MCG/ML injection 1 ml daily x 3, then weekly x 4, then monthly  .  ergocalciferol (DRISDOL) 50000 UNITS capsule Take 1 capsule (50,000 Units total) by mouth once a week.  . levothyroxine (SYNTHROID, LEVOTHROID) 125 MCG tablet Take 1 tablet (125 mcg total) by mouth daily.  . Syringe, Disposable, 1 ML MISC For B12 administration  . zoster vaccine live, PF, (ZOSTAVAX) 02409 UNT/0.65ML injection Inject 19,400 Units into the skin once.  . ALPRAZolam (XANAX) 0.25 MG tablet Take 1 tablet (0.25 mg total) by mouth at bedtime as needed for sleep.

## 2014-09-12 NOTE — Patient Instructions (Addendum)
Return for thyroid check on or after Nobember 15th.  i WILL ALSO CHECK TO SEE IF YOU HAVE BEEN EXPOSED TO HERPES IN THE PAST   You can use the alprazolam as needed for early morning insomnia   Ok to combine with celexa   I want you to lose 22 lbs over !  Health Maintenance Adopting a healthy lifestyle and getting preventive care can go a long way to promote health and wellness. Talk with your health care provider about what schedule of regular examinations is right for you. This is a good chance for you to check in with your provider about disease prevention and staying healthy. In between checkups, there are plenty of things you can do on your own. Experts have done a lot of research about which lifestyle changes and preventive measures are most likely to keep you healthy. Ask your health care provider for more information. WEIGHT AND DIET  Eat a healthy diet  Be sure to include plenty of vegetables, fruits, low-fat dairy products, and lean protein.  Do not eat a lot of foods high in solid fats, added sugars, or salt.  Get regular exercise. This is one of the most important things you can do for your health.  Most adults should exercise for at least 150 minutes each week. The exercise should increase your heart rate and make you sweat (moderate-intensity exercise).  Most adults should also do strengthening exercises at least twice a week. This is in addition to the moderate-intensity exercise.  Maintain a healthy weight  Body mass index (BMI) is a measurement that can be used to identify possible weight problems. It estimates body fat based on height and weight. Your health care provider can help determine your BMI and help you achieve or maintain a healthy weight.  For females 38 years of age and older:   A BMI below 18.5 is considered underweight.  A BMI of 18.5 to 24.9 is normal.  A BMI of 25 to 29.9 is considered overweight.  A BMI of 30 and above is considered obese.   Watch levels of cholesterol and blood lipids  You should start having your blood tested for lipids and cholesterol at 67 years of age, then have this test every 5 years.  You may need to have your cholesterol levels checked more often if:  Your lipid or cholesterol levels are high.  You are older than 67 years of age.  You are at high risk for heart disease.  CANCER SCREENING   Lung Cancer  Lung cancer screening is recommended for adults 57-38 years old who are at high risk for lung cancer because of a history of smoking.  A yearly low-dose CT scan of the lungs is recommended for people who:  Currently smoke.  Have quit within the past 15 years.  Have at least a 30-pack-year history of smoking. A pack year is smoking an average of one pack of cigarettes a day for 1 year.  Yearly screening should continue until it has been 15 years since you quit.  Yearly screening should stop if you develop a health problem that would prevent you from having lung cancer treatment.  Breast Cancer  Practice breast self-awareness. This means understanding how your breasts normally appear and feel.  It also means doing regular breast self-exams. Let your health care provider know about any changes, no matter how small.  If you are in your 20s or 30s, you should have a clinical breast exam (CBE) by  a health care provider every 1-3 years as part of a regular health exam.  If you are 40 or older, have a CBE every year. Also consider having a breast X-ray (mammogram) every year.  If you have a family history of breast cancer, talk to your health care provider about genetic screening.  If you are at high risk for breast cancer, talk to your health care provider about having an MRI and a mammogram every year.  Breast cancer gene (BRCA) assessment is recommended for women who have family members with BRCA-related cancers. BRCA-related cancers  include:  Breast.  Ovarian.  Tubal.  Peritoneal cancers.  Results of the assessment will determine the need for genetic counseling and BRCA1 and BRCA2 testing. Cervical Cancer Routine pelvic examinations to screen for cervical cancer are no longer recommended for nonpregnant women who are considered low risk for cancer of the pelvic organs (ovaries, uterus, and vagina) and who do not have symptoms. A pelvic examination may be necessary if you have symptoms including those associated with pelvic infections. Ask your health care provider if a screening pelvic exam is right for you.   The Pap test is the screening test for cervical cancer for women who are considered at risk.  If you had a hysterectomy for a problem that was not cancer or a condition that could lead to cancer, then you no longer need Pap tests.  If you are older than 65 years, and you have had normal Pap tests for the past 10 years, you no longer need to have Pap tests.  If you have had past treatment for cervical cancer or a condition that could lead to cancer, you need Pap tests and screening for cancer for at least 20 years after your treatment.  If you no longer get a Pap test, assess your risk factors if they change (such as having a new sexual partner). This can affect whether you should start being screened again.  Some women have medical problems that increase their chance of getting cervical cancer. If this is the case for you, your health care provider may recommend more frequent screening and Pap tests.  The human papillomavirus (HPV) test is another test that may be used for cervical cancer screening. The HPV test looks for the virus that can cause cell changes in the cervix. The cells collected during the Pap test can be tested for HPV.  The HPV test can be used to screen women 30 years of age and older. Getting tested for HPV can extend the interval between normal Pap tests from three to five years.  An HPV  test also should be used to screen women of any age who have unclear Pap test results.  After 67 years of age, women should have HPV testing as often as Pap tests.  Colorectal Cancer  This type of cancer can be detected and often prevented.  Routine colorectal cancer screening usually begins at 67 years of age and continues through 67 years of age.  Your health care provider may recommend screening at an earlier age if you have risk factors for colon cancer.  Your health care provider may also recommend using home test kits to check for hidden blood in the stool.  A small camera at the end of a tube can be used to examine your colon directly (sigmoidoscopy or colonoscopy). This is done to check for the earliest forms of colorectal cancer.  Routine screening usually begins at age 50.  Direct examination   of the colon should be repeated every 5-10 years through 67 years of age. However, you may need to be screened more often if early forms of precancerous polyps or small growths are found. Skin Cancer  Check your skin from head to toe regularly.  Tell your health care provider about any new moles or changes in moles, especially if there is a change in a mole's shape or color.  Also tell your health care provider if you have a mole that is larger than the size of a pencil eraser.  Always use sunscreen. Apply sunscreen liberally and repeatedly throughout the day.  Protect yourself by wearing long sleeves, pants, a wide-brimmed hat, and sunglasses whenever you are outside. HEART DISEASE, DIABETES, AND HIGH BLOOD PRESSURE   Have your blood pressure checked at least every 1-2 years. High blood pressure causes heart disease and increases the risk of stroke.  If you are between 55 years and 79 years old, ask your health care provider if you should take aspirin to prevent strokes.  Have regular diabetes screenings. This involves taking a blood sample to check your fasting blood sugar  level.  If you are at a normal weight and have a low risk for diabetes, have this test once every three years after 67 years of age.  If you are overweight and have a high risk for diabetes, consider being tested at a younger age or more often. PREVENTING INFECTION  Hepatitis B  If you have a higher risk for hepatitis B, you should be screened for this virus. You are considered at high risk for hepatitis B if:  You were born in a country where hepatitis B is common. Ask your health care provider which countries are considered high risk.  Your parents were born in a high-risk country, and you have not been immunized against hepatitis B (hepatitis B vaccine).  You have HIV or AIDS.  You use needles to inject street drugs.  You live with someone who has hepatitis B.  You have had sex with someone who has hepatitis B.  You get hemodialysis treatment.  You take certain medicines for conditions, including cancer, organ transplantation, and autoimmune conditions. Hepatitis C  Blood testing is recommended for:  Everyone born from 1945 through 1965.  Anyone with known risk factors for hepatitis C. Sexually transmitted infections (STIs)  You should be screened for sexually transmitted infections (STIs) including gonorrhea and chlamydia if:  You are sexually active and are younger than 67 years of age.  You are older than 67 years of age and your health care provider tells you that you are at risk for this type of infection.  Your sexual activity has changed since you were last screened and you are at an increased risk for chlamydia or gonorrhea. Ask your health care provider if you are at risk.  If you do not have HIV, but are at risk, it may be recommended that you take a prescription medicine daily to prevent HIV infection. This is called pre-exposure prophylaxis (PrEP). You are considered at risk if:  You are sexually active and do not regularly use condoms or know the HIV status  of your partner(s).  You take drugs by injection.  You are sexually active with a partner who has HIV. Talk with your health care provider about whether you are at high risk of being infected with HIV. If you choose to begin PrEP, you should first be tested for HIV. You should then be tested every   3 months for as long as you are taking PrEP.  PREGNANCY   If you are premenopausal and you may become pregnant, ask your health care provider about preconception counseling.  If you may become pregnant, take 400 to 800 micrograms (mcg) of folic acid every day.  If you want to prevent pregnancy, talk to your health care provider about birth control (contraception). OSTEOPOROSIS AND MENOPAUSE   Osteoporosis is a disease in which the bones lose minerals and strength with aging. This can result in serious bone fractures. Your risk for osteoporosis can be identified using a bone density scan.  If you are 70 years of age or older, or if you are at risk for osteoporosis and fractures, ask your health care provider if you should be screened.  Ask your health care provider whether you should take a calcium or vitamin D supplement to lower your risk for osteoporosis.  Menopause may have certain physical symptoms and risks.  Hormone replacement therapy may reduce some of these symptoms and risks. Talk to your health care provider about whether hormone replacement therapy is right for you.  HOME CARE INSTRUCTIONS   Schedule regular health, dental, and eye exams.  Stay current with your immunizations.   Do not use any tobacco products including cigarettes, chewing tobacco, or electronic cigarettes.  If you are pregnant, do not drink alcohol.  If you are breastfeeding, limit how much and how often you drink alcohol.  Limit alcohol intake to no more than 1 drink per day for nonpregnant women. One drink equals 12 ounces of beer, 5 ounces of wine, or 1 ounces of hard liquor.  Do not use street  drugs.  Do not share needles.  Ask your health care provider for help if you need support or information about quitting drugs.  Tell your health care provider if you often feel depressed.  Tell your health care provider if you have ever been abused or do not feel safe at home. Document Released: 05/25/2011 Document Revised: 03/26/2014 Document Reviewed: 10/11/2013 [facility] Patient Information 2015 [place], [place]. This information is not intended to replace advice given to you by your health care provider. Make sure you discuss any questions you have with your health care provider.  the next 6 months through diet and exercise

## 2014-09-15 ENCOUNTER — Encounter: Payer: Self-pay | Admitting: Internal Medicine

## 2014-09-15 DIAGNOSIS — Z8619 Personal history of other infectious and parasitic diseases: Secondary | ICD-10-CM | POA: Insufficient documentation

## 2014-09-15 DIAGNOSIS — N952 Postmenopausal atrophic vaginitis: Secondary | ICD-10-CM | POA: Insufficient documentation

## 2014-09-15 DIAGNOSIS — F411 Generalized anxiety disorder: Secondary | ICD-10-CM | POA: Insufficient documentation

## 2014-09-15 DIAGNOSIS — Z0000 Encounter for general adult medical examination without abnormal findings: Secondary | ICD-10-CM | POA: Insufficient documentation

## 2014-09-15 NOTE — Assessment & Plan Note (Signed)
Levothyroxine has been resumed after 4 weeks of suspension for increasingly suppressed TSH despite serial reductions in dose. Resumed supplementation at 125 mcg due to development of severe symptoms.  Will repeat  TSH and Free t4 in late November.  Lab Results  Component Value Date   TSH 70.630* 09/04/2014

## 2014-09-15 NOTE — Assessment & Plan Note (Signed)
Discussed the cause of atrophic vaginitis  and potential treatments,  Both hormonal and nonhormonal.  She prefers to try OTC lubricants first.

## 2014-09-15 NOTE — Assessment & Plan Note (Signed)
Diagnosed in July,  Now on monthly home injections

## 2014-09-15 NOTE — Assessment & Plan Note (Signed)
Managed with citalopram.  Adding alprazolam for periodic insomnia with early morning wakening.

## 2014-09-15 NOTE — Assessment & Plan Note (Signed)
Not presently currently,  Prior workup unclear,  Will screen for herpes with serologies

## 2014-09-15 NOTE — Assessment & Plan Note (Signed)

## 2014-09-15 NOTE — Assessment & Plan Note (Signed)
I have addressed  BMI and recommended wt loss of 10% of body weight, or 22 lbs,  over the next 6 months using a low glycemic index diet and regular exercise a minimum of 5 days per week.

## 2014-09-19 ENCOUNTER — Other Ambulatory Visit: Payer: Self-pay | Admitting: Internal Medicine

## 2014-09-28 ENCOUNTER — Telehealth: Payer: Self-pay | Admitting: Internal Medicine

## 2014-09-28 MED ORDER — AMLODIPINE BESYLATE 2.5 MG PO TABS: 2.5000 mg | ORAL_TABLET | Freq: Every day | ORAL | Status: DC

## 2014-09-28 NOTE — Telephone Encounter (Signed)
Spoke with pt, attempted to schedule for Saturday clinic however schedule full.  Pt states she spoke with Dr Derrel Nip about this at her last appoint, she is wondering if an Rx can be called in.  Please advise

## 2014-09-28 NOTE — Telephone Encounter (Signed)
Ms. Girdner called saying she's not gotten a phone call back from a nurse here after having called on Wed. She still has a very bad headache that she's been dealing with for weeks now. She said some days it's worse than others but it is constant and not going away. She's wondering if Dr. Derrel Nip can prescribe something else for her or if she needs to be seen. Per Ms. Pavlovic, she's been back on Synthroid for weeks now and nothing has helped. Please call the patient. Pt cell ph# [phone] Thank you.

## 2014-09-28 NOTE — Telephone Encounter (Signed)
Spoke with pt, she states she spoke with the "operator" who said she would send Triage and [name] a message.  I advised her of the Rx and directions.  Pt verbalized understanding

## 2014-09-28 NOTE — Telephone Encounter (Signed)
There is not evidence that she spoke to anyone on Wednesday.  nothing in the chart.  Please ask her who she spoke with .  If she is having a daily headache  ,  i can call in a medication to take daily at bedtime. It's called amlodipipine,  A calcium channel blocker,  Which will also treat her elevated blood pressure.  She needs to be given an appt next week if not better over the weekend.

## 2014-10-01 ENCOUNTER — Telehealth: Payer: Self-pay

## 2014-10-01 NOTE — Telephone Encounter (Signed)
Per 09/28/14 telephone note, advised appt this week if headaches not improved with amlodipine. No available appointments with Dr. Derrel Nip. Appt scheduled with Dr. Gilford Rile for tomorrow 1:30.  Spoke with patient, has had constant headache for 2-3 weeks. Has tried Ibuprofen 800 mg, Aleve, nothing is helping. She has noticed slight improvement since starting Amlodipine but unresolved. Denies any other symptoms

## 2014-10-01 NOTE — Telephone Encounter (Signed)
The patient called and stated she is still having headaches.  She had a medication called in for her blood pressure in hopes it would help the headaches, however, she stated she is not feeling better. She is hoping for advice on what steps to take next.

## 2014-10-02 ENCOUNTER — Telehealth: Payer: Self-pay | Admitting: Internal Medicine

## 2014-10-02 ENCOUNTER — Encounter: Payer: Self-pay | Admitting: Internal Medicine

## 2014-10-02 ENCOUNTER — Ambulatory Visit (INDEPENDENT_AMBULATORY_CARE_PROVIDER_SITE_OTHER): Payer: Medicare Other | Admitting: Internal Medicine

## 2014-10-02 ENCOUNTER — Ambulatory Visit: Payer: Self-pay | Admitting: Internal Medicine

## 2014-10-02 VITALS — BP 120/70 | HR 65 | Temp 98.1°F | Ht 66.5 in | Wt 218.5 lb

## 2014-10-02 DIAGNOSIS — R51 Headache: Secondary | ICD-10-CM | POA: Diagnosis not present

## 2014-10-02 DIAGNOSIS — E890 Postprocedural hypothyroidism: Secondary | ICD-10-CM

## 2014-10-02 DIAGNOSIS — G44201 Tension-type headache, unspecified, intractable: Secondary | ICD-10-CM | POA: Diagnosis not present

## 2014-10-02 DIAGNOSIS — R519 Headache, unspecified: Secondary | ICD-10-CM | POA: Insufficient documentation

## 2014-10-02 LAB — COMPREHENSIVE METABOLIC PANEL
ALBUMIN: 4 g/dL (ref 3.5–5.2)
ALT: 22 U/L (ref 0–35)
AST: 23 U/L (ref 0–37)
Alkaline Phosphatase: 66 U/L (ref 39–117)
BILIRUBIN TOTAL: 1.2 mg/dL (ref 0.2–1.2)
BUN: 17 mg/dL (ref 6–23)
CO2: 26 meq/L (ref 19–32)
Calcium: 9.4 mg/dL (ref 8.4–10.5)
Chloride: 103 mEq/L (ref 96–112)
Creatinine, Ser: 0.8 mg/dL (ref 0.4–1.2)
GFR: 75.97 mL/min (ref >60.00)
Glucose, Bld: 94 mg/dL (ref 70–99)
Potassium: 4.4 mEq/L (ref 3.5–5.1)
SODIUM: 138 meq/L (ref 135–145)
TOTAL PROTEIN: 7.9 g/dL (ref 6.0–8.3)

## 2014-10-02 LAB — TSH: TSH: 7.89 u[IU]/mL — ABNORMAL HIGH (ref 0.35–4.50)

## 2014-10-02 LAB — CBC WITH DIFFERENTIAL/PLATELET
BASOS PCT: 0.4 % (ref 0.0–3.0)
Basophils Absolute: 0 10*3/uL (ref 0.0–0.1)
EOS PCT: 0.8 % (ref 0.0–5.0)
Eosinophils Absolute: 0.1 10*3/uL (ref 0.0–0.7)
HCT: 41.9 % (ref 36.0–46.0)
Hemoglobin: 13.9 g/dL (ref 12.0–15.0)
Lymphocytes Relative: 23.7 % (ref 12.0–46.0)
Lymphs Abs: 2.3 10*3/uL (ref 0.7–4.0)
MCHC: 33.1 g/dL (ref 30.0–36.0)
MCV: 90.1 fl (ref 78.0–100.0)
MONO ABS: 0.7 10*3/uL (ref 0.1–1.0)
MONOS PCT: 7.5 % (ref 3.0–12.0)
NEUTROS PCT: 67.6 % (ref 43.0–77.0)
Neutro Abs: 6.7 10*3/uL (ref 1.4–7.7)
Platelets: 232 10*3/uL (ref 150.0–400.0)
RBC: 4.65 Mil/uL (ref 3.87–5.11)
RDW: 15.1 % (ref 11.5–15.5)
WBC: 9.9 10*3/uL (ref 4.0–10.5)

## 2014-10-02 MED ORDER — CYCLOBENZAPRINE HCL 5 MG PO TABS: 5.0000 mg | ORAL_TABLET | Freq: Three times a day (TID) | ORAL | Status: DC | PRN

## 2014-10-02 NOTE — Telephone Encounter (Signed)
CT head was normal. Please have pt start Flexeril as instructed and follow up in 1 week.

## 2014-10-02 NOTE — Patient Instructions (Signed)
Labs today.  We will set up at CT of your head.  Start Flexeril 5mg  up to three times daily for headache.  Follow up in 1 week.

## 2014-10-02 NOTE — Telephone Encounter (Signed)
Spoke patient to notify her of Dr. Thomes Dinning comments. Patient verbalized understanding.

## 2014-10-02 NOTE — Progress Notes (Signed)
Pre visit review using our clinic review tool, if applicable. No additional management support is needed unless otherwise documented below in the visit note. 

## 2014-10-02 NOTE — Assessment & Plan Note (Signed)
Six week history of daily headache and nausea, with one episode of vomiting. Exam is normal. Description most consistent with tension type headache, however nausea is concerning. Will recheck TSH, CMP, CBC. Will get stat CT head without contrast. Start Flexeril 5mg  po tid prn. Follow up in 1 week.

## 2014-10-02 NOTE — Progress Notes (Signed)
Subjective:    Patient ID: Julie Perez, female    DOB: 04-Feb-1946, 68 y.o.   MRN: [id number]  HPI 68YO female presents for acute visit.  Headache - Developed headache about 6 weeks ago. Described as 4/10. Dull and aching. Located in back of head. Worse with movement. Feels nauseous. Last night had some diarrhea. Has some blurred vision, but this is chronic for her given cataracts. Had migraines in the distant past, but nothing like this. Has tried Aleve, Advil with no improvement. No trauma to head. Notes increased stressors. Daughter diagnosed with melanoma. Son going through divorce.    Review of Systems  Constitutional: Negative for fever, chills, appetite change, fatigue and unexpected weight change.  Eyes: Negative for visual disturbance.  Respiratory: Negative for shortness of breath.   Cardiovascular: Negative for chest pain and leg swelling.  Gastrointestinal: Positive for nausea, vomiting and diarrhea (only last night). Negative for abdominal pain.  Skin: Negative for color change and rash.  Neurological: Positive for headaches. Negative for dizziness, tremors, seizures, syncope, facial asymmetry, speech difficulty, weakness, light-headedness and numbness.  Hematological: Negative for adenopathy. Does not bruise/bleed easily.  Psychiatric/Behavioral: Negative for dysphoric mood. The patient is nervous/anxious.        Objective:    BP 120/70 mmHg  Pulse 65  Temp(Src) 98.1 F (36.7 C) (Oral)  Ht 5' 6.5" (1.689 m)  Wt 218 lb 8 oz (99.111 kg)  BMI 34.74 kg/m2  SpO2 96% Physical Exam  Constitutional: She is oriented to person, place, and time. She appears well-developed and well-nourished. No distress.  HENT:  Head: Normocephalic and atraumatic.  Right Ear: External ear normal.  Left Ear: External ear normal.  Nose: Nose normal.  Mouth/Throat: Oropharynx is clear and moist. No oropharyngeal exudate.  Eyes: Conjunctivae are normal. Pupils are equal, round, and  reactive to light. Right eye exhibits no discharge. Left eye exhibits no discharge. No scleral icterus.  Neck: Normal range of motion. Neck supple. No tracheal deviation present. No thyromegaly present.  Cardiovascular: Normal rate, regular rhythm, normal heart sounds and intact distal pulses.  Exam reveals no gallop and no friction rub.   No murmur heard. Pulmonary/Chest: Effort normal and breath sounds normal. No accessory muscle usage. No tachypnea. No respiratory distress. She has no decreased breath sounds. She has no wheezes. She has no rhonchi. She has no rales. She exhibits no tenderness.  Musculoskeletal: Normal range of motion. She exhibits no edema or tenderness.  Lymphadenopathy:    She has no cervical adenopathy.  Neurological: She is alert and oriented to person, place, and time. No cranial nerve deficit. She exhibits normal muscle tone. Coordination normal.  Skin: Skin is warm and dry. No rash noted. She is not diaphoretic. No erythema. No pallor.  Psychiatric: She has a normal mood and affect. Her behavior is normal. Judgment and thought content normal.          Assessment & Plan:   Problem List Items Addressed This Visit      Unprioritized   Cephalalgia - Primary    Six week history of daily headache and nausea, with one episode of vomiting. Exam is normal. Description most consistent with tension type headache, however nausea is concerning. Will recheck TSH, CMP, CBC. Will get stat CT head without contrast. Start Flexeril 13m po tid prn. Follow up in 1 week.    Relevant Medications      cyclobenzaprine (FLEXERIL) tablet   Other Relevant Orders      CBC  w/Diff      Comp Met (CMET)      CT Head Wo Contrast   Hypothyroidism, postradioiodine therapy   Relevant Orders      TSH       Return in about 1 week (around 10/09/2014).

## 2014-10-08 ENCOUNTER — Other Ambulatory Visit: Payer: Medicare Other

## 2014-10-09 ENCOUNTER — Encounter: Payer: Self-pay | Admitting: Internal Medicine

## 2014-10-09 ENCOUNTER — Ambulatory Visit (INDEPENDENT_AMBULATORY_CARE_PROVIDER_SITE_OTHER): Payer: Medicare Other | Admitting: Internal Medicine

## 2014-10-09 ENCOUNTER — Other Ambulatory Visit: Payer: Medicare Other

## 2014-10-09 ENCOUNTER — Telehealth: Payer: Self-pay | Admitting: *Deleted

## 2014-10-09 VITALS — BP 120/78 | HR 66 | Temp 98.2°F | Ht 66.5 in | Wt 219.2 lb

## 2014-10-09 DIAGNOSIS — G44201 Tension-type headache, unspecified, intractable: Secondary | ICD-10-CM | POA: Diagnosis not present

## 2014-10-09 DIAGNOSIS — E032 Hypothyroidism due to medicaments and other exogenous substances: Secondary | ICD-10-CM

## 2014-10-09 DIAGNOSIS — N949 Unspecified condition associated with female genital organs and menstrual cycle: Secondary | ICD-10-CM | POA: Diagnosis not present

## 2014-10-09 MED ORDER — BUTALBITAL-ASA-CAFFEINE 50-325-40 MG PO CAPS: 1.0000 | ORAL_CAPSULE | Freq: Two times a day (BID) | ORAL | Status: DC | PRN

## 2014-10-09 NOTE — Assessment & Plan Note (Signed)
Tension-type headache improved with Flexeril, however this is making her drowsy. Will start Fiorcet bid prn. If no improvement, plan for referral to [facility].

## 2014-10-09 NOTE — Progress Notes (Signed)
   Subjective:    Patient ID: Julie Perez, female    DOB: 1946-11-07, 68 y.o.   MRN: [id number]  HPI 68YO female presents for follow up.   Headache - Seen on 10/02/2014. CT head normal. Started on Flexeril. Nausea has resolved. Continues to have headaches almost every day. Some improvement with Flexeril.  Notes pain and muscle spasm in muscles of neck with occasional radiating pain up to head. No visual changes. No focal neurologic symptoms.  Review of Systems  Constitutional: Negative for fever, chills, appetite change, fatigue and unexpected weight change.  Eyes: Negative for visual disturbance.  Respiratory: Negative for shortness of breath.   Cardiovascular: Negative for chest pain and leg swelling.  Gastrointestinal: Negative for abdominal pain.  Musculoskeletal: Positive for myalgias, arthralgias and neck pain. Negative for neck stiffness.  Skin: Negative for color change and rash.  Neurological: Positive for headaches.  Hematological: Negative for adenopathy. Does not bruise/bleed easily.  Psychiatric/Behavioral: Negative for dysphoric mood. The patient is nervous/anxious.        Objective:    BP 120/78 mmHg  Pulse 66  Temp(Src) 98.2 F (36.8 C) (Oral)  Ht 5' 6.5" (1.689 m)  Wt 219 lb 4 oz (99.451 kg)  BMI 34.86 kg/m2  SpO2 97% Physical Exam  Constitutional: She is oriented to person, place, and time. She appears well-developed and well-nourished. No distress.  HENT:  Head: Normocephalic and atraumatic.  Right Ear: External ear normal.  Left Ear: External ear normal.  Nose: Nose normal.  Mouth/Throat: Oropharynx is clear and moist.  Eyes: Conjunctivae are normal. Pupils are equal, round, and reactive to light. Right eye exhibits no discharge. Left eye exhibits no discharge. No scleral icterus.  Neck: Normal range of motion. Neck supple. Muscular tenderness present. No spinous process tenderness present. No rigidity. No tracheal deviation, no edema, no erythema  and normal range of motion present. No thyroid mass and no thyromegaly present.    Cardiovascular: Normal rate, regular rhythm, normal heart sounds and intact distal pulses.  Exam reveals no gallop and no friction rub.   No murmur heard. Pulmonary/Chest: Effort normal and breath sounds normal. No accessory muscle usage. No tachypnea. No respiratory distress. She has no decreased breath sounds. She has no wheezes. She has no rhonchi. She has no rales. She exhibits no tenderness.  Musculoskeletal: Normal range of motion. She exhibits no edema or tenderness.  Lymphadenopathy:    She has no cervical adenopathy.  Neurological: She is alert and oriented to person, place, and time. No cranial nerve deficit. She exhibits normal muscle tone. Coordination normal.  Skin: Skin is warm and dry. No rash noted. She is not diaphoretic. No erythema. No pallor.  Psychiatric: She has a normal mood and affect. Her behavior is normal. Judgment and thought content normal.          Assessment & Plan:   Problem List Items Addressed This Visit      Unprioritized   Cephalalgia - Primary    Tension-type headache improved with Flexeril, however this is making her drowsy. Will start Fiorcet bid prn. If no improvement, plan for referral to [facility].    Relevant Medications      butalbital-aspirin-caffeine (FIORINAL) 50-325-40 MG per capsule    Other Visit Diagnoses    Iatrogenic hypothyroidism        Female genital lesion            Return in about 2 weeks (around 10/23/2014).

## 2014-10-09 NOTE — Telephone Encounter (Signed)
Spoke with the pharmacy tech.  She states pt paid out of pocket for the medication.  She further states they were able to put medication on a discount card to reduce the price.

## 2014-10-09 NOTE — Telephone Encounter (Signed)
Pt called states the Fiorinal is not covered by insurance.  Pt is requesting a medication that is covered.  Please advise

## 2014-10-09 NOTE — Telephone Encounter (Signed)
Can you ask the pharmacist the closest alternative version of Fiorcet that is covered?

## 2014-10-09 NOTE — Patient Instructions (Signed)
Start Butalbital-ASA-Caffeine 1 tablet up to twice daily to help with headaches.  Email or call with update.  If no improvement, we will make referral to headache center in [place].

## 2014-10-09 NOTE — Progress Notes (Signed)
Pre visit review using our clinic review tool, if applicable. No additional management support is needed unless otherwise documented below in the visit note. 

## 2014-10-10 LAB — T4 AND TSH
T4, Total: 8 ug/dL (ref 4.5–12.0)
TSH: 7.65 u[IU]/mL — ABNORMAL HIGH (ref 0.450–4.500)

## 2014-10-12 LAB — HSV(HERPES SMPLX)ABS-I+II(IGG+IGM)-BLD
HSV 1 Glycoprotein G Ab, IgG: <0.1 IV
HSV 2 Glycoprotein G Ab, IgG: 4.97 IV — ABNORMAL HIGH
Herpes Simplex Vrs I&II-IgM Ab (EIA): 2.29 INDEX — ABNORMAL HIGH

## 2014-10-13 ENCOUNTER — Other Ambulatory Visit: Payer: Self-pay | Admitting: Internal Medicine

## 2014-10-13 MED ORDER — LEVOTHYROXINE SODIUM 137 MCG PO TABS: 137.0000 ug | ORAL_TABLET | Freq: Every day | ORAL | Status: DC

## 2014-10-13 MED ORDER — ACYCLOVIR 400 MG PO TABS: 400.0000 mg | ORAL_TABLET | Freq: Every day | ORAL | Status: DC

## 2014-10-13 NOTE — Assessment & Plan Note (Signed)
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  acyclovir  rx called in for future use.

## 2014-10-13 NOTE — Addendum Note (Signed)
Addended by: Crecencio Mc on: 10/13/2014 03:22 PM   Modules accepted: Orders

## 2014-10-16 ENCOUNTER — Telehealth: Payer: Self-pay

## 2014-10-16 NOTE — Telephone Encounter (Signed)
The patient called and is hoping you can return her call on her cell-  [phone]

## 2014-10-16 NOTE — Telephone Encounter (Signed)
Return ed call to patient and she voiced understanding.

## 2014-10-23 ENCOUNTER — Encounter: Payer: Self-pay | Admitting: Internal Medicine

## 2014-11-06 ENCOUNTER — Ambulatory Visit: Payer: Self-pay | Admitting: Internal Medicine

## 2014-11-06 DIAGNOSIS — Z1231 Encounter for screening mammogram for malignant neoplasm of breast: Secondary | ICD-10-CM | POA: Diagnosis not present

## 2014-11-19 ENCOUNTER — Telehealth: Payer: Self-pay | Admitting: *Deleted

## 2014-11-19 ENCOUNTER — Encounter: Payer: Self-pay | Admitting: *Deleted

## 2014-11-19 ENCOUNTER — Other Ambulatory Visit (INDEPENDENT_AMBULATORY_CARE_PROVIDER_SITE_OTHER): Payer: Medicare Other

## 2014-11-19 DIAGNOSIS — E538 Deficiency of other specified B group vitamins: Secondary | ICD-10-CM

## 2014-11-19 DIAGNOSIS — Z79899 Other long term (current) drug therapy: Secondary | ICD-10-CM

## 2014-11-19 DIAGNOSIS — E890 Postprocedural hypothyroidism: Secondary | ICD-10-CM

## 2014-11-19 DIAGNOSIS — E785 Hyperlipidemia, unspecified: Secondary | ICD-10-CM

## 2014-11-19 DIAGNOSIS — E559 Vitamin D deficiency, unspecified: Secondary | ICD-10-CM

## 2014-11-19 LAB — CBC WITH DIFFERENTIAL/PLATELET
BASOS ABS: 0 10*3/uL (ref 0.0–0.1)
Basophils Relative: 0.4 % (ref 0.0–3.0)
Eosinophils Absolute: 0.1 10*3/uL (ref 0.0–0.7)
Eosinophils Relative: 1.9 % (ref 0.0–5.0)
HCT: 40 % (ref 36.0–46.0)
Hemoglobin: 13.1 g/dL (ref 12.0–15.0)
Lymphocytes Relative: 26.3 % (ref 12.0–46.0)
Lymphs Abs: 1.9 10*3/uL (ref 0.7–4.0)
MCHC: 32.8 g/dL (ref 30.0–36.0)
MCV: 91.4 fl (ref 78.0–100.0)
MONO ABS: 0.4 10*3/uL (ref 0.1–1.0)
Monocytes Relative: 5.7 % (ref 3.0–12.0)
NEUTROS PCT: 65.7 % (ref 43.0–77.0)
Neutro Abs: 4.7 10*3/uL (ref 1.4–7.7)
PLATELETS: 210 10*3/uL (ref 150.0–400.0)
RBC: 4.38 Mil/uL (ref 3.87–5.11)
RDW: 14.3 % (ref 11.5–15.5)
WBC: 7.2 10*3/uL (ref 4.0–10.5)

## 2014-11-19 LAB — VITAMIN D 25 HYDROXY (VIT D DEFICIENCY, FRACTURES): VITD: 26.77 ng/mL — AB (ref 30.00–100.00)

## 2014-11-19 LAB — COMPREHENSIVE METABOLIC PANEL
ALBUMIN: 3.9 g/dL (ref 3.5–5.2)
ALT: 20 U/L (ref 0–35)
AST: 18 U/L (ref 0–37)
Alkaline Phosphatase: 62 U/L (ref 39–117)
BUN: 19 mg/dL (ref 6–23)
CALCIUM: 8.8 mg/dL (ref 8.4–10.5)
CO2: 30 meq/L (ref 19–32)
Chloride: 105 mEq/L (ref 96–112)
Creatinine, Ser: 0.7 mg/dL (ref 0.4–1.2)
GFR: 90.07 mL/min (ref >60.00)
Glucose, Bld: 97 mg/dL (ref 70–99)
POTASSIUM: 4.4 meq/L (ref 3.5–5.1)
SODIUM: 141 meq/L (ref 135–145)
TOTAL PROTEIN: 6.9 g/dL (ref 6.0–8.3)
Total Bilirubin: 0.7 mg/dL (ref 0.2–1.2)

## 2014-11-19 LAB — VITAMIN B12: VITAMIN B 12: 188 pg/mL — AB (ref 211–911)

## 2014-11-19 LAB — LIPID PANEL
CHOL/HDL RATIO: 4
Cholesterol: 176 mg/dL (ref 0–200)
HDL: 50.1 mg/dL (ref >39.00)
LDL Cholesterol: 94 mg/dL (ref 0–99)
NONHDL: 125.9
Triglycerides: 162 mg/dL — ABNORMAL HIGH (ref 0.0–149.0)
VLDL: 32.4 mg/dL (ref 0.0–40.0)

## 2014-11-19 LAB — TSH: TSH: 1.09 u[IU]/mL (ref 0.35–4.50)

## 2014-11-19 NOTE — Telephone Encounter (Signed)
What labs and dx?  

## 2014-11-20 MED ORDER — ERGOCALCIFEROL 1.25 MG (50000 UT) PO CAPS: 50000.0000 [IU] | ORAL_CAPSULE | ORAL | Status: DC

## 2014-11-20 NOTE — Addendum Note (Signed)
Addended by: Crecencio Mc on: 11/20/2014 09:01 PM   Modules accepted: Orders

## 2014-11-21 ENCOUNTER — Encounter: Payer: Self-pay | Admitting: *Deleted

## 2014-11-26 ENCOUNTER — Other Ambulatory Visit: Payer: Self-pay | Admitting: *Deleted

## 2014-11-26 MED ORDER — VITAMIN D (ERGOCALCIFEROL) 1.25 MG (50000 UNIT) PO CAPS: ORAL_CAPSULE | ORAL | Status: DC

## 2014-11-26 NOTE — Telephone Encounter (Signed)
error 

## 2014-11-27 ENCOUNTER — Ambulatory Visit: Payer: Medicare Other

## 2014-11-28 ENCOUNTER — Ambulatory Visit (INDEPENDENT_AMBULATORY_CARE_PROVIDER_SITE_OTHER): Payer: Medicare Other | Admitting: Nurse Practitioner

## 2014-11-28 ENCOUNTER — Encounter: Payer: Self-pay | Admitting: Nurse Practitioner

## 2014-11-28 VITALS — BP 120/70 | HR 60 | Temp 98.4°F | Resp 12 | Ht 66.0 in | Wt 216.8 lb

## 2014-11-28 DIAGNOSIS — R05 Cough: Secondary | ICD-10-CM | POA: Diagnosis not present

## 2014-11-28 DIAGNOSIS — E538 Deficiency of other specified B group vitamins: Secondary | ICD-10-CM | POA: Diagnosis not present

## 2014-11-28 DIAGNOSIS — R059 Cough, unspecified: Secondary | ICD-10-CM

## 2014-11-28 MED ORDER — BENZONATATE 100 MG PO CAPS: 100.0000 mg | ORAL_CAPSULE | Freq: Two times a day (BID) | ORAL | Status: DC | PRN

## 2014-11-28 MED ORDER — GUAIFENESIN-CODEINE 100-10 MG/5ML PO SYRP: 5.0000 mL | ORAL_SOLUTION | Freq: Every evening | ORAL | Status: DC | PRN

## 2014-11-28 MED ORDER — CYANOCOBALAMIN 1000 MCG/ML IJ SOLN
1000.0000 ug | Freq: Once | INTRAMUSCULAR | Status: AC
  Administered 2014-11-28: 1000 ug via INTRAMUSCULAR

## 2014-11-28 NOTE — Patient Instructions (Signed)
You have had your B12 shot today, if you have not already please make an appointment for next week (Per Dr. Lupita Dawn instructions).   Your cough may be coming from reflux, allergies, or post nasal drip (PND).  PND and allergies can be treated with benadryl,  Allegra, Zyrtec and claritin. Benadryl is the most effective for drying you up but it is also the most sedating,  So try taking it at night and use one of the others in the daytime  If your cough is not improving with these measures please give [place] a call.

## 2014-11-28 NOTE — Progress Notes (Signed)
   Subjective:    Patient ID: Julie Perez, female    DOB: November 24, 1945, 69 y.o.   MRN: [id number]  HPI  Julie Perez is a 69 yo female with a CC of cough and leaking bladder. Onset 14 days without fever.   1) Dry hacking cough, headache, worse at night time and waking up  Mucinex- not helpful  Green tea lemon/honey- not helpful  No cough syrups  Advil- twice a week  Denies heart burn/gastric reflux OSA- does not wear CPAP   Review of Systems  HENT: Positive for sneezing. Negative for congestion, ear discharge, ear pain, postnasal drip, rhinorrhea, sinus pressure and sore throat.   Respiratory: Positive for cough. Negative for choking, chest tightness, shortness of breath and wheezing.   Cardiovascular: Negative for chest pain, palpitations and leg swelling.  Gastrointestinal: Negative for nausea, vomiting, abdominal pain and diarrhea.  Skin: Negative for rash.  Neurological: Positive for headaches. Negative for dizziness.      Objective:   Physical Exam  Constitutional: She is oriented to person, place, and time. She appears well-developed and well-nourished. No distress.  HENT:  Head: Normocephalic and atraumatic.  Mouth/Throat: Oropharynx is clear and moist.  Eyes: Conjunctivae and EOM are normal. Pupils are equal, round, and reactive to light.  Neck: Normal range of motion. Neck supple.  Cardiovascular: Normal rate and regular rhythm.   Pulmonary/Chest: Effort normal and breath sounds normal. No respiratory distress. She has no wheezes. She has no rales. She exhibits no tenderness.  Lymphadenopathy:    She has no cervical adenopathy.  Neurological: She is alert and oriented to person, place, and time.  Skin: Skin is warm and dry. No rash noted. She is not diaphoretic.  Psychiatric: She has a normal mood and affect. Her behavior is normal. Judgment and thought content normal.    BP 120/70 mmHg  Pulse 60  Temp(Src) 98.4 F (36.9 C) (Oral)  Resp 12  Ht 5\' 6"  (1.676 m)   Wt 216 lb 12.8 oz (98.34 kg)  BMI 35.01 kg/m2  SpO2 96%    Assessment & Plan:

## 2014-11-28 NOTE — Progress Notes (Signed)
Pre visit review using our clinic review tool, if applicable. No additional management support is needed unless otherwise documented below in the visit note. 

## 2014-11-29 DIAGNOSIS — R053 Chronic cough: Secondary | ICD-10-CM | POA: Insufficient documentation

## 2014-11-29 DIAGNOSIS — R05 Cough: Secondary | ICD-10-CM | POA: Insufficient documentation

## 2014-11-29 NOTE — Assessment & Plan Note (Signed)
Worsening. Rx for Cheratussin AC and [employer]. Pt instructed to stay hydrated, and to call us if worse, not improving, or fever of 101 or greater.

## 2014-11-29 NOTE — Assessment & Plan Note (Addendum)
Pt was due for Injection (B12) on 11/27/13, but came in today so she waited to get it during the visit today. It was given after visit by LPN. Instructed by Dr. Derrel Nip to come back each month for three months. This was re-iterated to pt.

## 2014-12-05 ENCOUNTER — Other Ambulatory Visit: Payer: Self-pay | Admitting: *Deleted

## 2014-12-05 ENCOUNTER — Ambulatory Visit (INDEPENDENT_AMBULATORY_CARE_PROVIDER_SITE_OTHER): Payer: Medicare Other | Admitting: *Deleted

## 2014-12-05 DIAGNOSIS — E538 Deficiency of other specified B group vitamins: Secondary | ICD-10-CM

## 2014-12-05 MED ORDER — LEVOTHYROXINE SODIUM 137 MCG PO TABS: 137.0000 ug | ORAL_TABLET | Freq: Every day | ORAL | Status: DC

## 2014-12-05 MED ORDER — CYANOCOBALAMIN 1000 MCG/ML IJ SOLN
1000.0000 ug | Freq: Once | INTRAMUSCULAR | Status: AC
  Administered 2014-12-05: 1000 ug via INTRAMUSCULAR

## 2014-12-12 ENCOUNTER — Ambulatory Visit (INDEPENDENT_AMBULATORY_CARE_PROVIDER_SITE_OTHER): Payer: Medicare Other | Admitting: *Deleted

## 2014-12-12 DIAGNOSIS — E538 Deficiency of other specified B group vitamins: Secondary | ICD-10-CM | POA: Diagnosis not present

## 2014-12-12 MED ORDER — CYANOCOBALAMIN 1000 MCG/ML IJ SOLN
1000.0000 ug | Freq: Once | INTRAMUSCULAR | Status: AC
  Administered 2014-12-12: 1000 ug via INTRAMUSCULAR

## 2014-12-18 ENCOUNTER — Encounter: Payer: Self-pay | Admitting: Internal Medicine

## 2014-12-26 ENCOUNTER — Telehealth: Payer: Self-pay | Admitting: *Deleted

## 2014-12-26 NOTE — Telephone Encounter (Signed)
Pt called requesting Guaifenesin-Codeine refill.  Last OV and refill 1.6.16.  Please advise refill

## 2014-12-27 MED ORDER — GUAIFENESIN-CODEINE 100-10 MG/5ML PO SYRP: 5.0000 mL | ORAL_SOLUTION | Freq: Every evening | ORAL | Status: DC | PRN

## 2014-12-27 NOTE — Telephone Encounter (Signed)
rx faxed

## 2014-12-27 NOTE — Telephone Encounter (Signed)
Ok to refill,  printed rx  

## 2014-12-28 ENCOUNTER — Other Ambulatory Visit: Payer: Self-pay

## 2014-12-28 MED ORDER — CITALOPRAM HYDROBROMIDE 40 MG PO TABS: 40.0000 mg | ORAL_TABLET | Freq: Every day | ORAL | Status: DC

## 2014-12-28 NOTE — Telephone Encounter (Signed)
Resent to [facility] 

## 2014-12-28 NOTE — Telephone Encounter (Signed)
OK to fill

## 2014-12-28 NOTE — Telephone Encounter (Signed)
The patient called and is hoping for a refill on citalopram tabs 40mg   Pharmacy - [facility],  Pt callback - [phone]

## 2014-12-28 NOTE — Telephone Encounter (Signed)
Ok to refill,  Authorized in epic 

## 2014-12-30 ENCOUNTER — Ambulatory Visit: Payer: Self-pay | Admitting: Internal Medicine

## 2014-12-30 DIAGNOSIS — F329 Major depressive disorder, single episode, unspecified: Secondary | ICD-10-CM | POA: Diagnosis not present

## 2014-12-30 DIAGNOSIS — I10 Essential (primary) hypertension: Secondary | ICD-10-CM | POA: Diagnosis not present

## 2014-12-30 DIAGNOSIS — E780 Pure hypercholesterolemia: Secondary | ICD-10-CM | POA: Diagnosis not present

## 2014-12-30 DIAGNOSIS — J329 Chronic sinusitis, unspecified: Secondary | ICD-10-CM | POA: Diagnosis not present

## 2014-12-30 DIAGNOSIS — E039 Hypothyroidism, unspecified: Secondary | ICD-10-CM | POA: Diagnosis not present

## 2014-12-30 DIAGNOSIS — Z79899 Other long term (current) drug therapy: Secondary | ICD-10-CM | POA: Diagnosis not present

## 2015-01-02 ENCOUNTER — Ambulatory Visit (INDEPENDENT_AMBULATORY_CARE_PROVIDER_SITE_OTHER): Payer: Medicare Other | Admitting: Nurse Practitioner

## 2015-01-02 ENCOUNTER — Encounter: Payer: Self-pay | Admitting: Nurse Practitioner

## 2015-01-02 VITALS — BP 118/70 | HR 50 | Temp 98.1°F | Resp 12 | Ht 66.5 in | Wt 216.8 lb

## 2015-01-02 DIAGNOSIS — R05 Cough: Secondary | ICD-10-CM

## 2015-01-02 DIAGNOSIS — R059 Cough, unspecified: Secondary | ICD-10-CM

## 2015-01-02 NOTE — Progress Notes (Signed)
Pre visit review using our clinic review tool, if applicable. No additional management support is needed unless otherwise documented below in the visit note. 

## 2015-01-02 NOTE — Progress Notes (Signed)
   Subjective:    Patient ID: Julie Perez, female    DOB: 24-Feb-1946, 69 y.o.   MRN: [id number]  HPI  Ms. Julie Perez is a 68 female with a CC of cough x 3 weeks.  1) Seen by urgent care 3 days ago. Has tried Prednisone, Tessalon, and currently on Cefdinir and Cheratussin.   Going to [place] Friday wants to be over the cough Coughing and sore throat still  Started Sunday 3 days into Cefdinir and taking twice daily  Pt is requesting refill on Cheratussin  Advil- Helps   Review of Systems  HENT: Positive for congestion, postnasal drip and sinus pressure. Negative for sneezing.   Respiratory: Positive for cough. Negative for chest tightness and wheezing.   Cardiovascular: Negative for chest pain, palpitations and leg swelling.  Gastrointestinal: Negative for nausea, vomiting and diarrhea.  Skin: Negative for rash.  Neurological: Negative for dizziness, weakness and headaches.  Hematological: Does not bruise/bleed easily.      Objective:   Physical Exam  Constitutional: She is oriented to person, place, and time. She appears well-developed and well-nourished. No distress.  BP 118/70 mmHg  Pulse 50  Temp(Src) 98.1 F (36.7 C) (Oral)  Resp 12  Ht 5' 6.5" (1.689 m)  Wt 216 lb 12.8 oz (98.34 kg)  BMI 34.47 kg/m2  SpO2 97%   HENT:  Head: Normocephalic and atraumatic.  Right Ear: External ear normal.  Left Ear: External ear normal.  Cardiovascular: Normal rate, regular rhythm, normal heart sounds and intact distal pulses.  Exam reveals no gallop and no friction rub.   No murmur heard. Pulmonary/Chest: Effort normal and breath sounds normal. No respiratory distress. She has no wheezes. She has no rales. She exhibits no tenderness.  Neurological: She is alert and oriented to person, place, and time. No cranial nerve deficit. She exhibits normal muscle tone. Coordination normal.  Skin: Skin is warm and dry. No rash noted. She is not diaphoretic.  Psychiatric: She has a normal mood  and affect. Her behavior is normal. Judgment and thought content normal.       Assessment & Plan:

## 2015-01-02 NOTE — Patient Instructions (Addendum)
Sudafed PE congestion  Benadryl to dry up Post Nasal Drip  Keep taking the Cefdinir   If you return and still don't feel better then we will do a Chest X-ray

## 2015-01-03 NOTE — Assessment & Plan Note (Signed)
Pt was not improved, saw Urgent care and is somewhat improving currently. She is requesting more Cheratussin and I asked her how she was taking it. I stated it is 5 ml at bedtime and last script was 12/27/14 by Dr. Derrel Nip. She reports taking the medicine cup off of another syrup full (30 ml). She was instructed that it is improper use and only a teaspoon full is required at night. Pt verbalized understanding. Continue current treatment.

## 2015-01-15 ENCOUNTER — Ambulatory Visit: Payer: Medicare Other

## 2015-01-17 ENCOUNTER — Ambulatory Visit (INDEPENDENT_AMBULATORY_CARE_PROVIDER_SITE_OTHER): Payer: Medicare Other

## 2015-01-17 DIAGNOSIS — E538 Deficiency of other specified B group vitamins: Secondary | ICD-10-CM | POA: Diagnosis not present

## 2015-01-17 MED ORDER — CYANOCOBALAMIN 1000 MCG/ML IJ SOLN
1000.0000 ug | Freq: Once | INTRAMUSCULAR | Status: AC
  Administered 2015-01-17: 1000 ug via INTRAMUSCULAR

## 2015-01-22 ENCOUNTER — Other Ambulatory Visit: Payer: Self-pay | Admitting: Internal Medicine

## 2015-02-14 ENCOUNTER — Ambulatory Visit (INDEPENDENT_AMBULATORY_CARE_PROVIDER_SITE_OTHER): Payer: Medicare Other | Admitting: *Deleted

## 2015-02-14 DIAGNOSIS — E538 Deficiency of other specified B group vitamins: Secondary | ICD-10-CM | POA: Diagnosis not present

## 2015-02-14 MED ORDER — CYANOCOBALAMIN 1000 MCG/ML IJ SOLN
1000.0000 ug | Freq: Once | INTRAMUSCULAR | Status: AC
  Administered 2015-02-14: 1000 ug via INTRAMUSCULAR

## 2015-02-14 NOTE — Progress Notes (Signed)
Patient presented in clinic today for regular monthly injectin of B 12 . Injection given in right detoid and patient tolerated well.

## 2015-03-18 ENCOUNTER — Ambulatory Visit (INDEPENDENT_AMBULATORY_CARE_PROVIDER_SITE_OTHER): Payer: Medicare Other | Admitting: Internal Medicine

## 2015-03-18 ENCOUNTER — Ambulatory Visit (INDEPENDENT_AMBULATORY_CARE_PROVIDER_SITE_OTHER)
Admission: RE | Admit: 2015-03-18 | Discharge: 2015-03-18 | Disposition: A | Discharge status: Home / Self Care | Payer: Medicare Other | Source: Ambulatory Visit | Attending: Internal Medicine | Admitting: Internal Medicine

## 2015-03-18 ENCOUNTER — Encounter: Payer: Self-pay | Admitting: Internal Medicine

## 2015-03-18 VITALS — BP 118/72 | HR 58 | Temp 98.5°F | Resp 14 | Ht 67.0 in | Wt 213.0 lb

## 2015-03-18 DIAGNOSIS — E669 Obesity, unspecified: Secondary | ICD-10-CM

## 2015-03-18 DIAGNOSIS — E785 Hyperlipidemia, unspecified: Secondary | ICD-10-CM

## 2015-03-18 DIAGNOSIS — R059 Cough, unspecified: Secondary | ICD-10-CM

## 2015-03-18 DIAGNOSIS — G4733 Obstructive sleep apnea (adult) (pediatric): Secondary | ICD-10-CM

## 2015-03-18 DIAGNOSIS — R05 Cough: Secondary | ICD-10-CM | POA: Diagnosis not present

## 2015-03-18 DIAGNOSIS — Z79899 Other long term (current) drug therapy: Secondary | ICD-10-CM

## 2015-03-18 DIAGNOSIS — E538 Deficiency of other specified B group vitamins: Secondary | ICD-10-CM

## 2015-03-18 DIAGNOSIS — E559 Vitamin D deficiency, unspecified: Secondary | ICD-10-CM

## 2015-03-18 DIAGNOSIS — E890 Postprocedural hypothyroidism: Secondary | ICD-10-CM | POA: Diagnosis not present

## 2015-03-18 DIAGNOSIS — I10 Essential (primary) hypertension: Secondary | ICD-10-CM

## 2015-03-18 LAB — COMPREHENSIVE METABOLIC PANEL
ALT: 14 U/L (ref 0–35)
AST: 15 U/L (ref 0–37)
Albumin: 4.3 g/dL (ref 3.5–5.2)
Alkaline Phosphatase: 64 U/L (ref 39–117)
BILIRUBIN TOTAL: 0.9 mg/dL (ref 0.2–1.2)
BUN: 17 mg/dL (ref 6–23)
CO2: 29 mEq/L (ref 19–32)
CREATININE: 0.64 mg/dL (ref 0.40–1.20)
Calcium: 9.3 mg/dL (ref 8.4–10.5)
Chloride: 103 mEq/L (ref 96–112)
GFR: 98.15 mL/min (ref >60.00)
Glucose, Bld: 96 mg/dL (ref 70–99)
POTASSIUM: 4.8 meq/L (ref 3.5–5.1)
SODIUM: 139 meq/L (ref 135–145)
TOTAL PROTEIN: 7 g/dL (ref 6.0–8.3)

## 2015-03-18 LAB — CBC WITH DIFFERENTIAL/PLATELET
Basophils Absolute: 0 10*3/uL (ref 0.0–0.1)
Basophils Relative: 0.3 % (ref 0.0–3.0)
EOS ABS: 0.1 10*3/uL (ref 0.0–0.7)
EOS PCT: 2.1 % (ref 0.0–5.0)
HEMATOCRIT: 40.6 % (ref 36.0–46.0)
HEMOGLOBIN: 13.8 g/dL (ref 12.0–15.0)
LYMPHS ABS: 2.2 10*3/uL (ref 0.7–4.0)
Lymphocytes Relative: 31.7 % (ref 12.0–46.0)
MCHC: 34 g/dL (ref 30.0–36.0)
MCV: 86.6 fl (ref 78.0–100.0)
Monocytes Absolute: 0.5 10*3/uL (ref 0.1–1.0)
Monocytes Relative: 7.8 % (ref 3.0–12.0)
NEUTROS PCT: 58.1 % (ref 43.0–77.0)
Neutro Abs: 3.9 10*3/uL (ref 1.4–7.7)
Platelets: 230 10*3/uL (ref 150.0–400.0)
RBC: 4.69 Mil/uL (ref 3.87–5.11)
RDW: 14.1 % (ref 11.5–15.5)
WBC: 6.8 10*3/uL (ref 4.0–10.5)

## 2015-03-18 LAB — LIPID PANEL
Cholesterol: 171 mg/dL (ref 0–200)
HDL: 48.5 mg/dL (ref >39.00)
LDL Cholesterol: 91 mg/dL (ref 0–99)
NonHDL: 122.5
Total CHOL/HDL Ratio: 4
Triglycerides: 157 mg/dL — ABNORMAL HIGH (ref 0.0–149.0)
VLDL: 31.4 mg/dL (ref 0.0–40.0)

## 2015-03-18 LAB — TSH: TSH: 0.09 u[IU]/mL — AB (ref 0.35–4.50)

## 2015-03-18 LAB — VITAMIN D 25 HYDROXY (VIT D DEFICIENCY, FRACTURES): VITD: 30.58 ng/mL (ref 30.00–100.00)

## 2015-03-18 LAB — VITAMIN B12: Vitamin B-12: >1500 pg/mL — ABNORMAL HIGH (ref 211–911)

## 2015-03-18 MED ORDER — ATORVASTATIN CALCIUM 80 MG PO TABS: 80.0000 mg | ORAL_TABLET | Freq: Every day | ORAL | Status: DC

## 2015-03-18 MED ORDER — BENZONATATE 200 MG PO CAPS: 200.0000 mg | ORAL_CAPSULE | Freq: Three times a day (TID) | ORAL | Status: DC | PRN

## 2015-03-18 MED ORDER — ALPRAZOLAM 0.25 MG PO TABS: 0.2500 mg | ORAL_TABLET | Freq: Every evening | ORAL | Status: DC | PRN

## 2015-03-18 MED ORDER — CYANOCOBALAMIN 1000 MCG/ML IJ SOLN
1000.0000 ug | Freq: Once | INTRAMUSCULAR | Status: AC
  Administered 2015-03-18: 1000 ug via INTRAMUSCULAR

## 2015-03-18 MED ORDER — AMLODIPINE BESYLATE 2.5 MG PO TABS: ORAL_TABLET | ORAL | Status: DC

## 2015-03-18 MED ORDER — CITALOPRAM HYDROBROMIDE 40 MG PO TABS: 40.0000 mg | ORAL_TABLET | Freq: Every day | ORAL | Status: DC

## 2015-03-18 NOTE — Progress Notes (Signed)
Pre-visit discussion using our clinic review tool. No additional management support is needed unless otherwise documented below in the visit note.  

## 2015-03-18 NOTE — Patient Instructions (Addendum)
Chest xray today   Starting omeprazole OTC 20 mg ,  Take 2 hours after lunch   Add allegra 180 mg one tablet daily with omeprazole  I want you to get your BMI < 30 this year,  This means a goal weight of 190 lbs.   .This is Dr. Lupita Dawn version of a  "Low GI"  Weight loss Diet.  It is appropriate for all patients with normal renal function , gluten tolerance, and advised for patients who have prediabetes or diabetes:   All of the foods can be found at grocery stores and in bulk at [employer].  The Atkins protein bars and shakes are available in more varieties at Target, WalMart and [employer].     7 AM Breakfast:  Choose from the following:  < 5 carbs  Weekdays: Low carbohydrate Protein  Shakes (EAS AdvantEdge "Carb  Control" shakes, Atkins,  Muscle Milk or Premier Protein shakes)     Weekends:  a scrambled egg/bacon/cheese burrito made with Mission's "carb  balance" whole wheat tortilla  (about 10 net carbs )  Eggs,  bacon /sausage , Joseph's pita /lavash bread or  (5 carbs)  A slice of fritatta ( egg based baked dish, no  crust:  google it) (< 10 carbs)   Avoid cereal and bananas, oatmeal and cream of wheat and grits. They are loaded with carbohydrates!   10 AM: high protein snack  (< 5 carbs)   Protein bar by Atkins  Or KIND  (the snack size, < 200 cal, usually < 6 carbs    A stick of cheese:  Around 1 carb,  100 cal      Other so called "protein bars" tend to be loaded with carbohydrates.  Remember, in food advertising, the word "energy" is synonymous for " carbohydrate."  Lunch:   A Sandwich using the bread choices listed, Can use any  Eggs,  lunchmeat, grilled meat or canned tuna).  Can add avocado, regular mayo/mustard  and cheese.  A Salad using blue cheese, ranch,  Goddess dressing  or vinagrette,  No croutons or "confetti" and no "candied nuts" but regular nuts OK.   2 HARD BOILED EGG WHITES AND A CUP OF one of these greek yogurts:    dannon lt n fit greek yogurt          chobani 100 greek yogurt,    Oikos triple zero greek yogurt       No pretzels or chips.  Pickles and miniature sweet peppers are a good low  carb alternative that provide a "crunch"  The bread is the only source of carbohydrate in a sandwich and  can be decreased by trying some of these alternatives to traditional loaf bread:   Joseph's pita bread and Lavash (flat) bread :  50 cal and 4 net carbs  available at BJs and WalMart.  Taste better when toasted, use as pita chips  [employer] makes a variety of  flatbreads and  A PITA POCKET.    LOOK FOR  THE ONES THAT ARE 17 NET CARBS OR LESS    M[employer] makes 2 sizes of  Low carb whole wheat tortillas  (The large one is  210 cal and 6 net carbs)   Avoid "Low fat dressings, as well as Barry Brunner and [place] dressings    3 PM/ Mid day  Snack:  Consider  1 ounce of  almonds, walnuts, pistachios, pecans, peanuts,  Macadamia nuts or a nut medley that does  not contain raisins or cranberries.  No "granola"; the dried cranberries and raisins are loaded with carbohydrates. Mixed nuts as long as there are no raisins,  cranberries or dried fruit.    Try the prosciutto/mozzarella cheese sticks by [employer]  In deli /backery section   High protein   To avoid overindulging in snacks: Try drinking a glass of unsweeted almond/coconut milk  Or a cup of coffee with your Atkins chocolate bar to keep you from having 3!!!   Pork rinds!  Yes Pork Rinds are low carb potato chip substitute!   Toasted Joseph's flatbread with hummous dip (chickpeas)       6 PM  Dinner:     Meat/fowl/fish with a green salad, and either broccoli, cauliflower, green beans, spinach, brussel sprouts, bok choy or  Lima beans. Fried in canola oil /olive oil BUT DO NOT BREAD THE PROTEIN!!      There is a low carb pasta by Dreamfield's that is acceptable and tastes great: only 5 digestible carbs/serving.( All grocery stores but BJs carry it )  Prepared Meals:  Try Hurley Cisco Angelo's chicken piccata  or chicken or eggplant parm over low carb pasta.(Lowes and BJs)   Marjory Lies Sanchez's "Carnitas" (pulled pork, no sauce,  0 carbs) or his beef pot roast to make a dinner burrito (at [employer])  Barbecue with cole slaw is low carb BUT NO BUN!  SAME WITH HAMBURGERS     Whole wheat pasta is still full of digestible carbs and  Not as low in glycemic index as Dreamfield's.   Brown rice is still rice,  So skip the rice and noodles if you eat [place] or [place] (or at least limit to 1/2 cup)  9 PM snack :   Breyer's "low carb" fudgsicle or  ice cream bar (Carb Smart line), or  Weight  Watcher's ice cream bar , or another "no sugar added" ice cream;  a serving of fresh berries/cherries with whipped cream   Cheese or greek yogurt   8 ounces of [place] unsweetened almond/cococunut milk  Cheese and crackers (using WASA crackers,  They are low carb) or peanut butter on low carb crackers or pita bread     Avoid bananas, pineapple, grapes  and watermelon on a regular basis because they are high in sugar.  THINK OF THEM AS DESSERT and do not have daily   Remember that snack Substitutions should be less than 10 NET carbs per serving and meals should be < 20 net carbs. Remember that carbohydrates from fiber do not affect blood sugar, so you can  subtract fiber grams to get the "net carbs " of any particular food item.

## 2015-03-18 NOTE — Progress Notes (Addendum)
Patient ID: Julie Perez, female   DOB: 1946/05/06, 69 y.o.   MRN: [id number]  Patient Active Problem List   Diagnosis Date Noted  . Cough 11/29/2014  . Long-term use of high-risk medication 11/19/2014  . Cephalalgia 10/02/2014  . Postmenopausal atrophic vaginitis 09/15/2014  . Encounter for Medicare annual wellness exam 09/15/2014  . Genital lesion, female 09/15/2014  . Generalized anxiety disorder 09/15/2014  . S/P hysterectomy 09/12/2014  . B12 deficiency 06/22/2014  . Vitamin D deficiency 06/22/2014  . Obesity 06/22/2014  . OSA (obstructive sleep apnea) 06/22/2014  . 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 Hyperlipidemia LDL goal <130 04/09/2008    Subjective:  CC:   Chief Complaint  Patient presents with  . Follow-up    Fatigue,  . Hypothyroidism  . Cough    spells of coughing, dry non-productive    HPI:   Julie Perez is a 69 y.o. female who presents for follow up on multipel issues including obesity with untreated OSA, hyperlipidemia, treaged, hypthyroidism, and subacute development of cough.  Persistent cough which has been unresolved since December .  Patient has been treated twice before by other providers and states that although her cough as improved,  It never completely resolved despite treatment with antibiotics twice and prednisone.  She denies wheezing, chest pain,  Fevers,  And dyspnea except  exertion.   Taper Once.   Currently taking nothing,  No reflux medications .  NO history of travel, no sick contacts,  No history of smoking,  Environmental triggers, or known history of GERD.   OSA: still deferring treatment,  Wt loss has been achieved of only 3 lbs  Body mass index is 33.35 kg/(m^2).  Lipids/thyroid:  Taking meds as directed. Due for fasting labs today.    Past Medical History  Diagnosis Date  . Frequent headaches     stress  related  . Hyperlipidemia   . Thyroid disease   . Migraines     Past Surgical History   Procedure Laterality Date  . Abdominal hysterectomy  1979       The following portions of the patient's history were reviewed and updated as appropriate: Allergies, current medications, and problem list.    Review of Systems:   Patient denies headache, fevers, malaise, unintentional weight loss, skin rash, eye pain, sinus congestion and sinus pain, sore throat, dysphagia,  hemoptysis , cough, dyspnea, wheezing, chest pain, palpitations, orthopnea, edema, abdominal pain, nausea, melena, diarrhea, constipation, flank pain, dysuria, hematuria, urinary  Frequency, nocturia, numbness, tingling, seizures,  Focal weakness, Loss of consciousness,  Tremor, insomnia, depression, anxiety, and suicidal ideation.     History   Social History  . Marital Status: Married    Spouse Name: N/A  . Number of Children: N/A  . Years of Education: N/A   Occupational History  . Not on file.   Social History Main Topics  . Smoking status: Never Smoker   . Smokeless tobacco: Never Used  . Alcohol Use: 4.2 oz/week    7 Glasses of wine per week     Comment: occasionally  . Drug Use: No  . Sexual Activity: No   Other Topics Concern  . Not on file   Social History Narrative    Objective:  Filed Vitals:   03/18/15 0842  BP: 118/72  Pulse: 58  Temp: 98.5 F (36.9 C)  Resp: 14     General appearance: alert, cooperative and appears stated age Ears: normal TM's and external ear canals both  ears Throat: lips, mucosa, and tongue normal; teeth and gums normal Neck: no adenopathy, no carotid bruit, supple, symmetrical, trachea midline and thyroid not enlarged, symmetric, no tenderness/mass/nodules Back: symmetric, no curvature. ROM normal. No CVA tenderness. Lungs: clear to auscultation bilaterally Heart: regular rate and rhythm, S1, S2 normal, no murmur, click, rub or gallop Abdomen: soft, non-tender; bowel sounds normal; no masses,  no organomegaly Pulses: 2+ and symmetric Skin: Skin  color, texture, turgor normal. No rashes or lesions Lymph nodes: Cervical, supraclavicular, and axillary nodes normal.  Assessment and Plan:  Cough Now chronic.  Chest x ray showed low lung volumes,  Cardiomegaly without pulmonary edema, and no acute changes.  Will start   Suppressive therapy and empiric tx for GERD, PND due to allergic rhinitis.  Return one month    Obesity With complications including OSA .  I have addressed  BMI and recommended wt loss of 10% of body weight over the next 6 months using a low glycemic index diet and regular exercise a minimum of 5 days per week.     Hypothyroidism, postradioiodine therapy Thyroid function is overactive on current dose of levothyroxine 2137 mcg.   I have sent a lower dose of levothyroxine to her pharmacy and would like patient to change immediately.    Hyperlipidemia LDL goal <130 Well controlled on current statin therapy.   Liver enzymes are normal , no changes today.  Lab Results  Component Value Date   CHOL 171 03/18/2015   HDL 48.50 03/18/2015   LDLCALC 91 03/18/2015   LDLDIRECT 102.5 07/31/2014   TRIG 157.0* 03/18/2015   CHOLHDL 4 03/18/2015   Lab Results  Component Value Date   ALT 14 03/18/2015   AST 15 03/18/2015   ALKPHOS 64 03/18/2015   BILITOT 0.9 03/18/2015       OSA (obstructive sleep apnea) diagnosed with prior sleep study but treatment has been deferred d by patient.  Discussed the long term history of OSA , the risks of long term damage to heart and the signs and symptoms attributable to OSA.  Given her cardiomegaly on  Chest x ray I have again advised patient to consider  significant weight loss and/or use of CPAP.      A total of 40 minutes was spent with patient more than half of which was spent in counseling patient on the above mentioned issues , reviewing and explaining recent labs and imaging studies done, and coordination of care.  Updated Medication List Outpatient Encounter Prescriptions as  of 03/18/2015  Medication Sig  . Cholecalciferol (VITAMIN D3) 1000 UNITS CAPS Take 1 capsule by mouth daily.  Marland Kitchen Specialty Vitamins Products (MAGNESIUM, AMINO ACID CHELATE,) 133 MG tablet Take 1 tablet by mouth 2 (two) times daily.  . [DISCONTINUED] ALPRAZolam (XANAX) 0.25 MG tablet Take 1 tablet (0.25 mg total) by mouth at bedtime as needed for sleep.  . [DISCONTINUED] amLODipine (NORVASC) 2.5 MG tablet TAKE 1 TABLET (2.5 MG TOTAL) BY MOUTH AT BEDTIME.  . [DISCONTINUED] atorvastatin (LIPITOR) 80 MG tablet Take by mouth. Half tablet (40 mg) at night  . [DISCONTINUED] citalopram (CELEXA) 40 MG tablet Take 1 tablet (40 mg total) by mouth daily.  . [DISCONTINUED] levothyroxine (SYNTHROID, LEVOTHROID) 137 MCG tablet Take 1 tablet (137 mcg total) by mouth daily.  Marland Kitchen ALPRAZolam (XANAX) 0.25 MG tablet Take 1 tablet (0.25 mg total) by mouth at bedtime as needed for sleep.  Marland Kitchen amLODipine (NORVASC) 2.5 MG tablet TAKE 1 TABLET (2.5 MG TOTAL) BY MOUTH AT  BEDTIME.  Marland Kitchen atorvastatin (LIPITOR) 80 MG tablet Take 1 tablet (80 mg total) by mouth daily at 6 PM. Half tablet (40 mg) at night  . benzonatate (TESSALON) 200 MG capsule Take 1 capsule (200 mg total) by mouth 3 (three) times daily as needed for cough.  . citalopram (CELEXA) 40 MG tablet Take 1 tablet (40 mg total) by mouth daily.  Marland Kitchen levothyroxine (SYNTHROID, LEVOTHROID) 100 MCG tablet Take 1 tablet (100 mcg total) by mouth daily.  . [DISCONTINUED] acyclovir (ZOVIRAX) 400 MG tablet Take 1 tablet (400 mg total) by mouth 5 (five) times daily. (Patient not taking: Reported on 03/18/2015)  . [DISCONTINUED] butalbital-aspirin-caffeine (FIORINAL) 50-325-40 MG per capsule Take 1 capsule by mouth 2 (two) times daily as needed for headache. (Patient not taking: Reported on 03/18/2015)  . [DISCONTINUED] cyclobenzaprine (FLEXERIL) 5 MG tablet Take 1 tablet (5 mg total) by mouth 3 (three) times daily as needed (headache). (Patient not taking: Reported on 03/18/2015)  .  [DISCONTINUED] guaiFENesin-codeine (CHERATUSSIN AC) 100-10 MG/5ML syrup Take 5 mLs by mouth at bedtime as needed for cough. (Patient not taking: Reported on 03/18/2015)  . [EXPIRED] cyanocobalamin ((VITAMIN B-12)) injection 1,000 mcg      Orders Placed This Encounter  Procedures  . DG Chest 2 View  . CBC with Differential/Platelet  . Comprehensive metabolic panel  . TSH  . Vit D  25 hydroxy (rtn osteoporosis monitoring)  . Vitamin B12  . Lipid panel    Return in about 4 weeks (around 04/15/2015).

## 2015-03-19 ENCOUNTER — Encounter: Payer: Self-pay | Admitting: Internal Medicine

## 2015-03-19 ENCOUNTER — Telehealth: Payer: Self-pay | Admitting: Internal Medicine

## 2015-03-19 ENCOUNTER — Other Ambulatory Visit: Payer: Self-pay | Admitting: Internal Medicine

## 2015-03-19 DIAGNOSIS — E032 Hypothyroidism due to medicaments and other exogenous substances: Secondary | ICD-10-CM

## 2015-03-19 MED ORDER — LEVOTHYROXINE SODIUM 100 MCG PO TABS: 100.0000 ug | ORAL_TABLET | Freq: Every day | ORAL | Status: DC

## 2015-03-19 NOTE — Telephone Encounter (Signed)
Sent to Julie Perez via Deloris Ping but [name] please note: she needs lab appt for TSH in 6 weeks AND needs her previous 137 mcg thyroid  med stopped [facility] getting [facility]mail order:    Your chest x ray is clear.  There is no sign of pneumonia or COPD  so I do not feel you need antibiotics at this time, but you should take the allegra and omeprazole as discussed .    Your heart size was a little enlarged,  Which may be due to your sleep apnea , Please confirm that you are using your CPAP a minimum of 8 hours every night,   Your other labs were normal except for your thyroid. Thyroid function is again overactive on current dose of levothyroxine   I have sent a lower dose of levothyroxine (100 mcg ) to your local  pharmacy and would like you to  change immediately.  We will cancel the [facility] refills and repeat your TSH in 6 weeks,   Regards,  Dr. Derrel Nip

## 2015-03-19 NOTE — Assessment & Plan Note (Signed)
Well controlled on current statin therapy.   Liver enzymes are normal , no changes today.  Lab Results  Component Value Date   CHOL 171 03/18/2015   HDL 48.50 03/18/2015   LDLCALC 91 03/18/2015   LDLDIRECT 102.5 07/31/2014   TRIG 157.0* 03/18/2015   CHOLHDL 4 03/18/2015   Lab Results  Component Value Date   ALT 14 03/18/2015   AST 15 03/18/2015   ALKPHOS 64 03/18/2015   BILITOT 0.9 03/18/2015

## 2015-03-19 NOTE — Addendum Note (Signed)
Addended by: Crecencio Mc on: 03/19/2015 09:00 AM   Modules accepted: Level of Service

## 2015-03-19 NOTE — Telephone Encounter (Signed)
Will discuss at next appt,  One month

## 2015-03-19 NOTE — Assessment & Plan Note (Signed)
With complications including OSA .  I have addressed  BMI and recommended wt loss of 10% of body weight over the next 6 months using a low glycemic index diet and regular exercise a minimum of 5 days per week.

## 2015-03-19 NOTE — Telephone Encounter (Signed)
[facility] is having technical difficulty and I am Unable to reach pharmacy and was directed to call back in 2 -3 hours. Was able to contact patient whom stated she has another number where she can cancel order and a web site  Might be available. Patient stated she is not using CPAP at all she returned the machine and has not used for years. Patient is aware to Pick up new dose of Levothyroxine.

## 2015-03-19 NOTE — Assessment & Plan Note (Signed)
diagnosed with prior sleep study but treatment has been deferred d by patient.  Discussed the long term history of OSA , the risks of long term damage to heart and the signs and symptoms attributable to OSA.  Given her cardiomegaly on  Chest x ray I have again advised patient to consider  significant weight loss and/or use of CPAP.

## 2015-03-19 NOTE — Assessment & Plan Note (Addendum)
Now chronic.  Chest x ray showed low lung volumes,  Cardiomegaly without pulmonary edema, and no acute changes.  Will start   Suppressive therapy and empiric tx for GERD, PND due to allergic rhinitis.  Return one month

## 2015-03-19 NOTE — Assessment & Plan Note (Signed)
Thyroid function is overactive on current dose of levothyroxine 2137 mcg.   I have sent a lower dose of levothyroxine to her pharmacy and would like patient to change immediately.

## 2015-03-20 ENCOUNTER — Telehealth: Payer: Self-pay | Admitting: *Deleted

## 2015-03-20 NOTE — Telephone Encounter (Signed)
Pt called, stating [facility] told her the levothyroxine was on hold, she did not pick up last night. Called [facility], spoke to [place], states it is ready for pickup. Pt notified.

## 2015-03-26 ENCOUNTER — Other Ambulatory Visit: Payer: Self-pay | Admitting: Internal Medicine

## 2015-03-26 DIAGNOSIS — E890 Postprocedural hypothyroidism: Secondary | ICD-10-CM

## 2015-03-26 DIAGNOSIS — E785 Hyperlipidemia, unspecified: Secondary | ICD-10-CM

## 2015-03-26 MED ORDER — ATORVASTATIN CALCIUM 80 MG PO TABS: 80.0000 mg | ORAL_TABLET | Freq: Every day | ORAL | Status: DC

## 2015-03-26 NOTE — Assessment & Plan Note (Signed)
Current dose is 100 mcg daily,  tsh due mid June 2016.

## 2015-04-12 NOTE — Telephone Encounter (Signed)
Pt was notified via telephone, see other encounter

## 2015-04-17 ENCOUNTER — Ambulatory Visit (INDEPENDENT_AMBULATORY_CARE_PROVIDER_SITE_OTHER): Payer: Medicare Other | Admitting: *Deleted

## 2015-04-17 ENCOUNTER — Ambulatory Visit (INDEPENDENT_AMBULATORY_CARE_PROVIDER_SITE_OTHER): Payer: Medicare Other | Admitting: Internal Medicine

## 2015-04-17 VITALS — BP 126/68 | HR 57 | Temp 98.3°F | Resp 14 | Ht 67.0 in | Wt 218.5 lb

## 2015-04-17 DIAGNOSIS — I517 Cardiomegaly: Secondary | ICD-10-CM | POA: Diagnosis not present

## 2015-04-17 DIAGNOSIS — E538 Deficiency of other specified B group vitamins: Secondary | ICD-10-CM | POA: Diagnosis not present

## 2015-04-17 DIAGNOSIS — R059 Cough, unspecified: Secondary | ICD-10-CM

## 2015-04-17 DIAGNOSIS — I10 Essential (primary) hypertension: Secondary | ICD-10-CM | POA: Diagnosis not present

## 2015-04-17 DIAGNOSIS — R05 Cough: Secondary | ICD-10-CM | POA: Diagnosis not present

## 2015-04-17 MED ORDER — CYANOCOBALAMIN 1000 MCG/ML IJ SOLN
1000.0000 ug | Freq: Once | INTRAMUSCULAR | Status: AC
  Administered 2015-04-17: 1000 ug via INTRAMUSCULAR

## 2015-04-17 NOTE — Patient Instructions (Addendum)
I am glad your cough is better , but we may be able to completely resolve it if it is due tr GERD or PND,  So:  You can try  doubling the omeprazole to twice daily OR  change to 20 mg Famotidine twice daily  (both treat GERD)  Add benadryl (dipenhydramine)   25 mg at bedtime (Post nasal drip)  Continue the allegra  We do NOT have to evlaute the heart right now  retur n in 3 months

## 2015-04-17 NOTE — Progress Notes (Signed)
Pre-visit discussion using our clinic review tool. No additional management support is needed unless otherwise documented below in the visit note.  

## 2015-04-17 NOTE — Progress Notes (Signed)
Subjective:  Patient ID: Julie Perez, female    DOB: November 05, 1946  Age: 69 y.o. MRN: [id number]  CC: The primary encounter diagnosis was Cough. A diagnosis of Cardiomegaly was also pertinent to this visit.  HPI Julie Perez presents for follow up on cough.  She was last seen on April 26  Chest x ray showed low lung volumes, Cardiomegaly without pulmonary edema, and no acute changes.She was sent for an a x ray and prescribed suppressive therapy and empiric tx for GERD, PND due to allergic rhinitis. Return one month   Cough has improved but still occasionally has episodes at night while sitting up ,  Occurs 2 to 3 times per week.   Taking omeprazole,  And allegra       Outpatient Prescriptions Prior to Visit  Medication Sig Dispense Refill  . ALPRAZolam (XANAX) 0.25 MG tablet Take 1 tablet (0.25 mg total) by mouth at bedtime as needed for sleep. 30 tablet 2  . amLODipine (NORVASC) 2.5 MG tablet TAKE 1 TABLET (2.5 MG TOTAL) BY MOUTH AT BEDTIME. 90 tablet 1  . atorvastatin (LIPITOR) 80 MG tablet Take 1 tablet (80 mg total) by mouth daily at 6 PM. 90 tablet 1  . benzonatate (TESSALON) 200 MG capsule Take 1 capsule (200 mg total) by mouth 3 (three) times daily as needed for cough. 60 capsule 1  . Cholecalciferol (VITAMIN D3) 1000 UNITS CAPS Take 1 capsule by mouth daily.    . citalopram (CELEXA) 40 MG tablet Take 1 tablet (40 mg total) by mouth daily. 90 tablet 0  . levothyroxine (SYNTHROID, LEVOTHROID) 100 MCG tablet Take 1 tablet (100 mcg total) by mouth daily. 30 tablet 1  . Specialty Vitamins Products (MAGNESIUM, AMINO ACID CHELATE,) 133 MG tablet Take 1 tablet by mouth 2 (two) times daily.     No facility-administered medications prior to visit.    Review of Systems;  Patient denies headache, fevers, malaise, unintentional weight loss, skin rash, eye pain, sinus congestion and sinus pain, sore throat, dysphagia,  hemoptysis , cough, dyspnea, wheezing, chest pain,  palpitations, orthopnea, edema, abdominal pain, nausea, melena, diarrhea, constipation, flank pain, dysuria, hematuria, urinary  Frequency, nocturia, numbness, tingling, seizures,  Focal weakness, Loss of consciousness,  Tremor, insomnia, depression, anxiety, and suicidal ideation.      Objective:  BP 126/68 mmHg  Pulse 57  Temp(Src) 98.3 F (36.8 C) (Oral)  Resp 14  Ht 5\' 7"  (1.702 m)  Wt 218 lb 8 oz (99.111 kg)  BMI 34.21 kg/m2  SpO2 97%  BP Readings from Last 3 Encounters:  04/17/15 126/68  03/18/15 118/72  01/02/15 118/70    Wt Readings from Last 3 Encounters:  04/17/15 218 lb 8 oz (99.111 kg)  03/18/15 213 lb (96.616 kg)  01/02/15 216 lb 12.8 oz (98.34 kg)    General appearance: alert, cooperative and appears stated age Ears: normal TM's and external ear canals both ears Throat: lips, mucosa, and tongue normal; teeth and gums normal Neck: no adenopathy, no carotid bruit, supple, symmetrical, trachea midline and thyroid not enlarged, symmetric, no tenderness/mass/nodules Back: symmetric, no curvature. ROM normal. No CVA tenderness. Lungs: clear to auscultation bilaterally Heart: regular rate and rhythm, S1, S2 normal, no murmur, click, rub or gallop Abdomen: soft, non-tender; bowel sounds normal; no masses,  no organomegaly Pulses: 2+ and symmetric Skin: Skin color, texture, turgor normal. No rashes or lesions Lymph nodes: Cervical, supraclavicular, and axillary nodes normal.  No results found for: HGBA1C  Lab Results  Component Value Date   CREATININE 0.64 03/18/2015   CREATININE 0.7 11/19/2014   CREATININE 0.8 10/02/2014    Lab Results  Component Value Date   WBC 6.8 03/18/2015   HGB 13.8 03/18/2015   HCT 40.6 03/18/2015   PLT 230.0 03/18/2015   GLUCOSE 96 03/18/2015   CHOL 171 03/18/2015   TRIG 157.0* 03/18/2015   HDL 48.50 03/18/2015   LDLDIRECT 102.5 07/31/2014   LDLCALC 91 03/18/2015   ALT 14 03/18/2015   AST 15 03/18/2015   NA 139 03/18/2015    K 4.8 03/18/2015   CL 103 03/18/2015   CREATININE 0.64 03/18/2015   BUN 17 03/18/2015   CO2 29 03/18/2015   TSH 0.09* 03/18/2015    Dg Chest 2 View  03/18/2015   CLINICAL DATA:  Dry cough.  EXAM: CHEST  2 VIEW  COMPARISON:  None.  FINDINGS: Mediastinum hilar structures are normal. Low lung volumes with mild bibasilar atelectasis. Cardiomegaly with normal pulmonary vascularity. No pleural effusion or pneumothorax.  IMPRESSION: 1. Mild cardiomegaly.  No CHF. 2. No acute pulmonary disease.   Electronically Signed   By: Marcello Moores  Register   On: 03/18/2015 10:59    Assessment & Plan:   Problem List Items Addressed This Visit    Cough - Primary    Improved signifiantly but not resolved,  Increased PPI to bid, continue antihistamind and cough suppression.add benadryl for nighttime PND .        Cardiomegaly    On chest x ray with no signs of symptoms of pulmonary edema or systolic or diastolic dysfunction  No workup at this time.          I am having Ms. Tijerina maintain her Vitamin D3, magnesium (amino acid chelate), ALPRAZolam, amLODipine, citalopram, benzonatate, levothyroxine, atorvastatin, and Magnesium.   A total of 25 minutes of face to face time was spent with patient more than half of which was spent in counselling on the above mentioned issues.  Meds ordered this encounter  Medications  . Magnesium 250 MG TABS    Sig: Take 250 mg by mouth daily.    There are no discontinued medications.  Follow-up: Return in about 3 months (around 07/18/2015).   Crecencio Mc, MD

## 2015-04-20 ENCOUNTER — Encounter: Payer: Self-pay | Admitting: Internal Medicine

## 2015-04-20 DIAGNOSIS — I10 Essential (primary) hypertension: Secondary | ICD-10-CM | POA: Insufficient documentation

## 2015-04-20 DIAGNOSIS — I517 Cardiomegaly: Secondary | ICD-10-CM | POA: Insufficient documentation

## 2015-04-20 NOTE — Assessment & Plan Note (Signed)
On chest x ray with no signs of symptoms of pulmonary edema or systolic or diastolic dysfunction  No workup at this time.

## 2015-04-20 NOTE — Assessment & Plan Note (Signed)
Well controlled on current regimen. Renal function stable, no changes today.  Lab Results  Component Value Date   CREATININE 0.64 03/18/2015   Lab Results  Component Value Date   NA 139 03/18/2015   K 4.8 03/18/2015   CL 103 03/18/2015   CO2 29 03/18/2015

## 2015-04-20 NOTE — Assessment & Plan Note (Addendum)
Improved signifiantly but not resolved,  Increased PPI to bid, continue antihistamind and cough suppression.add benadryl for nighttime PND .

## 2015-05-15 ENCOUNTER — Ambulatory Visit: Payer: Medicare Other

## 2015-05-16 ENCOUNTER — Other Ambulatory Visit: Payer: Self-pay | Admitting: Internal Medicine

## 2015-05-23 ENCOUNTER — Encounter: Payer: Self-pay | Admitting: Internal Medicine

## 2015-05-23 ENCOUNTER — Ambulatory Visit (INDEPENDENT_AMBULATORY_CARE_PROVIDER_SITE_OTHER): Payer: Medicare Other | Admitting: Internal Medicine

## 2015-05-23 VITALS — BP 110/68 | HR 63 | Temp 98.3°F | Resp 12 | Ht 67.0 in | Wt 220.0 lb

## 2015-05-23 DIAGNOSIS — E032 Hypothyroidism due to medicaments and other exogenous substances: Secondary | ICD-10-CM

## 2015-05-23 DIAGNOSIS — E669 Obesity, unspecified: Secondary | ICD-10-CM

## 2015-05-23 DIAGNOSIS — E890 Postprocedural hypothyroidism: Secondary | ICD-10-CM

## 2015-05-23 DIAGNOSIS — R059 Cough, unspecified: Secondary | ICD-10-CM

## 2015-05-23 DIAGNOSIS — R5383 Other fatigue: Secondary | ICD-10-CM

## 2015-05-23 DIAGNOSIS — G4733 Obstructive sleep apnea (adult) (pediatric): Secondary | ICD-10-CM

## 2015-05-23 DIAGNOSIS — R05 Cough: Secondary | ICD-10-CM | POA: Diagnosis not present

## 2015-05-23 DIAGNOSIS — Z8619 Personal history of other infectious and parasitic diseases: Secondary | ICD-10-CM

## 2015-05-23 DIAGNOSIS — E538 Deficiency of other specified B group vitamins: Secondary | ICD-10-CM

## 2015-05-23 MED ORDER — BENZONATATE 200 MG PO CAPS
200.0000 mg | ORAL_CAPSULE | Freq: Three times a day (TID) | ORAL | Status: DC | PRN
Start: 2015-05-23 — End: 2016-05-20

## 2015-05-23 NOTE — Progress Notes (Signed)
Subjective:  Patient ID: Julie Perez, female    DOB: [birth year]  Age: 69 y.o. MRN: [id number]  CC: The primary encounter diagnosis was Cough. Diagnoses of Hypothyroidism due to medication, Obstructive sleep apnea, OSA (obstructive sleep apnea), Other fatigue, Hypothyroidism, postradioiodine therapy, H/O herpes genitalis, B12 deficiency, and Obesity were also pertinent to this visit.  HPI Julie Perez presents for follow up on  Persistent cough and profound fatigue.  Her cough has greatly improved butshe is requesting a refill on the cough suppressive medication.  She continues to reports that she sleeps well with no frequent wakenings, but feels exhausted despite sleeping 6 to 8 hours per night with the use of alprazolam .  Snores , has had diagnosis of untreated sleep apnea for years. (diagnosed by sleep study done at [facility] by Dr. Humphrey Rolls years ago) but thinks the fatigue is due to her thyroid or due to recent addition of amlodipine.)   Outpatient Prescriptions Prior to Visit  Medication Sig Dispense Refill  . ALPRAZolam (XANAX) 0.25 MG tablet Take 1 tablet (0.25 mg total) by mouth at bedtime as needed for sleep. 30 tablet 2  . amLODipine (NORVASC) 2.5 MG tablet TAKE 1 TABLET (2.5 MG TOTAL) BY MOUTH AT BEDTIME. 90 tablet 1  . atorvastatin (LIPITOR) 80 MG tablet Take 1 tablet (80 mg total) by mouth daily at 6 PM. 90 tablet 1  . Cholecalciferol (VITAMIN D3) 1000 UNITS CAPS Take 1 capsule by mouth daily.    . citalopram (CELEXA) 40 MG tablet Take 1 tablet (40 mg total) by mouth daily. 90 tablet 0  . Magnesium 250 MG TABS Take 250 mg by mouth daily.    . benzonatate (TESSALON) 200 MG capsule Take 1 capsule (200 mg total) by mouth 3 (three) times daily as needed for cough. 60 capsule 1  . levothyroxine (SYNTHROID, LEVOTHROID) 100 MCG tablet TAKE 1 TABLET (100 MCG TOTAL) BY MOUTH DAILY. 30 tablet 0  . Specialty Vitamins Products (MAGNESIUM, AMINO ACID CHELATE,) 133 MG tablet Take 1 tablet by  mouth 2 (two) times daily.     No facility-administered medications prior to visit.    Review of Systems;  Patient denies headache, fevers, malaise, unintentional weight loss, skin rash, eye pain, sinus congestion and sinus pain, sore throat, dysphagia,  hemoptysis , cough, dyspnea, wheezing, chest pain, palpitations, orthopnea, edema, abdominal pain, nausea, melena, diarrhea, constipation, flank pain, dysuria, hematuria, urinary  Frequency, nocturia, numbness, tingling, seizures,  Focal weakness, Loss of consciousness,  Tremor, insomnia, depression, anxiety, and suicidal ideation.      Objective:  BP 110/68 mmHg  Pulse 63  Temp(Src) 98.3 F (36.8 C) (Oral)  Resp 12  Ht 5\' 7"  (1.702 m)  Wt 220 lb (99.791 kg)  BMI 34.45 kg/m2  SpO2 98%  BP Readings from Last 3 Encounters:  05/23/15 110/68  04/17/15 126/68  03/18/15 118/72    Wt Readings from Last 3 Encounters:  05/23/15 220 lb (99.791 kg)  04/17/15 218 lb 8 oz (99.111 kg)  03/18/15 213 lb (96.616 kg)    General appearance: alert, cooperative and appears stated age Ears: normal TM's and external ear canals both ears Throat: lips, mucosa, and tongue normal; teeth and gums normal Neck: no adenopathy, no carotid bruit, supple, symmetrical, trachea midline and thyroid not enlarged, symmetric, no tenderness/mass/nodules Back: symmetric, no curvature. ROM normal. No CVA tenderness. Lungs: clear to auscultation bilaterally Heart: regular rate and rhythm, S1, S2 normal, no murmur, click, rub or gallop Abdomen: soft, non-tender;  bowel sounds normal; no masses,  no organomegaly Pulses: 2+ and symmetric Skin: Skin color, texture, turgor normal. No rashes or lesions Lymph nodes: Cervical, supraclavicular, and axillary nodes normal.  No results found for: HGBA1C  Lab Results  Component Value Date   CREATININE 0.64 03/18/2015   CREATININE 0.7 11/19/2014   CREATININE 0.8 10/02/2014    Lab Results  Component Value Date   WBC  6.8 03/18/2015   HGB 13.8 03/18/2015   HCT 40.6 03/18/2015   PLT 230.0 03/18/2015   GLUCOSE 96 03/18/2015   CHOL 171 03/18/2015   TRIG 157.0* 03/18/2015   HDL 48.50 03/18/2015   LDLDIRECT 102.5 07/31/2014   LDLCALC 91 03/18/2015   ALT 14 03/18/2015   AST 15 03/18/2015   NA 139 03/18/2015   K 4.8 03/18/2015   CL 103 03/18/2015   CREATININE 0.64 03/18/2015   BUN 17 03/18/2015   CO2 29 03/18/2015   TSH 11.480* 05/23/2015    Dg Chest 2 View  03/18/2015   CLINICAL DATA:  Dry cough.  EXAM: CHEST  2 VIEW  COMPARISON:  None.  FINDINGS: Mediastinum hilar structures are normal. Low lung volumes with mild bibasilar atelectasis. Cardiomegaly with normal pulmonary vascularity. No pleural effusion or pneumothorax.  IMPRESSION: 1. Mild cardiomegaly.  No CHF. 2. No acute pulmonary disease.   Electronically Signed   By: Marcello Moores  Register   On: 03/18/2015 10:59    Assessment & Plan:   Problem List Items Addressed This Visit      Unprioritized   Hypothyroidism, postradioiodine therapy    Thyroid function is underactive  on current dose of levothyroxine 113mcg.   Dose increased to 112 mcg.  Will aim for TSH < 1.0 given history of hyperthyroidism requiring ablation .  Lab Results  Component Value Date   TSH 11.480* 05/23/2015          Relevant Medications   levothyroxine (SYNTHROID, LEVOTHROID) 112 MCG tablet   B12 deficiency    Resolved with serial I M injections.  Continue monthly       Obesity    With complications including OSA .  I have addressed  BMI and recommended wt loss of 10% of body weight over the next 6 months using a low glycemic index diet and regular exercise a minimum of 5 days per week.          OSA (obstructive sleep apnea)    Again urged to consider treating, whic at this point will require a new study.  She remains uninterested      H/O herpes genitalis   Cough - Primary    Advised to continue the medications that are treating GERD and allergic rhinitis and  less reliance on benzonatate       RESOLVED: Obstructive sleep apnea   Fatigue    Patient reassured that amlodipine is unlikely to be the cause.  If symptoms persist after normalizing her  thyroid function  She will consider repeating her sleep study        Other Visit Diagnoses    Hypothyroidism due to medication        Relevant Medications    levothyroxine (SYNTHROID, LEVOTHROID) 112 MCG tablet       I have discontinued Ms. Amspacher's magnesium (amino acid chelate). I have also changed her levothyroxine. Additionally, I am having her start on omeprazole. Lastly, I am having her maintain her Vitamin D3, ALPRAZolam, amLODipine, citalopram, atorvastatin, Magnesium, Vitamin B-12, and benzonatate.  Meds ordered this encounter  Medications  .  Cyanocobalamin (VITAMIN B-12) 2500 MCG SUBL    Sig: Place 1 tablet under the tongue daily.  . benzonatate (TESSALON) 200 MG capsule    Sig: Take 1 capsule (200 mg total) by mouth 3 (three) times daily as needed for cough.    Dispense:  60 capsule    Refill:  2  . levothyroxine (SYNTHROID, LEVOTHROID) 112 MCG tablet    Sig: Take 1 tablet (112 mcg total) by mouth daily before breakfast. 6 days per week and 100 mcg one day per week    Dispense:  90 tablet    Refill:  1  . omeprazole (PRILOSEC) 40 MG capsule    Sig: Take 1 capsule (40 mg total) by mouth daily.    Dispense:  30 capsule    Refill:  3    Medications Discontinued During This Encounter  Medication Reason  . Specialty Vitamins Products (MAGNESIUM, AMINO ACID CHELATE,) 133 MG tablet Error  . benzonatate (TESSALON) 200 MG capsule Reorder  . levothyroxine (SYNTHROID, LEVOTHROID) 100 MCG tablet Reorder   A total of 25 minutes of face to face time was spent with patient more than half of which was spent in counselling about the above mentioned conditions  and coordination of care  Follow-up: No Follow-up on file.   Crecencio Mc, MD

## 2015-05-23 NOTE — Patient Instructions (Signed)
You can suspend the amlodipine for now  We will adjust your thyroid medication if necessary to keep it as active as possible  I want you to lose 10% of your current body weight (21 lbs) over the next 6 months with exercise and a low glycemic index diet

## 2015-05-24 ENCOUNTER — Encounter: Payer: Self-pay | Admitting: Internal Medicine

## 2015-05-24 LAB — T4 AND TSH
T4 TOTAL: 5.7 ug/dL (ref 4.5–12.0)
TSH: 11.48 u[IU]/mL — AB (ref 0.450–4.500)

## 2015-05-24 MED ORDER — LEVOTHYROXINE SODIUM 112 MCG PO TABS: 112.0000 ug | ORAL_TABLET | Freq: Every day | ORAL | Status: DC

## 2015-05-26 DIAGNOSIS — R5383 Other fatigue: Secondary | ICD-10-CM | POA: Insufficient documentation

## 2015-05-26 DIAGNOSIS — G4733 Obstructive sleep apnea (adult) (pediatric): Secondary | ICD-10-CM | POA: Insufficient documentation

## 2015-05-26 MED ORDER — OMEPRAZOLE 40 MG PO CPDR: 40.0000 mg | DELAYED_RELEASE_CAPSULE | Freq: Every day | ORAL | Status: DC

## 2015-05-26 NOTE — Assessment & Plan Note (Signed)
With complications including OSA .  I have addressed  BMI and recommended wt loss of 10% of body weight over the next 6 months using a low glycemic index diet and regular exercise a minimum of 5 days per week.

## 2015-05-26 NOTE — Assessment & Plan Note (Signed)
Advised to continue the medications that are treating GERD and allergic rhinitis and less reliance on benzonatate

## 2015-05-26 NOTE — Assessment & Plan Note (Signed)
Resolved with serial I M injections.  Continue monthly

## 2015-05-26 NOTE — Assessment & Plan Note (Signed)
Patient reassured that amlodipine is unlikely to be the cause.  If symptoms persist after normalizing her  thyroid function  She will consider repeating her sleep study

## 2015-05-26 NOTE — Assessment & Plan Note (Signed)
Again urged to consider treating, whic at this point will require a new study.  She remains uninterested

## 2015-05-26 NOTE — Assessment & Plan Note (Addendum)
Thyroid function is underactive  on current dose of levothyroxine 122mcg.   Dose increased to 112 mcg.  Will aim for TSH < 1.0 given history of hyperthyroidism requiring ablation .  Lab Results  Component Value Date   TSH 11.480* 05/23/2015

## 2015-06-05 ENCOUNTER — Telehealth: Payer: Self-pay

## 2015-06-05 DIAGNOSIS — Z79899 Other long term (current) drug therapy: Secondary | ICD-10-CM | POA: Diagnosis not present

## 2015-06-05 NOTE — Telephone Encounter (Signed)
Notified patient there was an insufficient amount of urine obtained for [facility] report.  Patient has upcoming appointment late August.  Patient would rather come tomorrow morning.  Lab notified.

## 2015-06-06 ENCOUNTER — Other Ambulatory Visit: Payer: Medicare Other

## 2015-07-01 ENCOUNTER — Encounter: Payer: Self-pay | Admitting: Internal Medicine

## 2015-07-03 ENCOUNTER — Other Ambulatory Visit: Payer: Self-pay | Admitting: Internal Medicine

## 2015-07-08 DIAGNOSIS — M9902 Segmental and somatic dysfunction of thoracic region: Secondary | ICD-10-CM | POA: Diagnosis not present

## 2015-07-08 DIAGNOSIS — M545 Low back pain: Secondary | ICD-10-CM | POA: Diagnosis not present

## 2015-07-08 DIAGNOSIS — M5134 Other intervertebral disc degeneration, thoracic region: Secondary | ICD-10-CM | POA: Diagnosis not present

## 2015-07-08 DIAGNOSIS — M5030 Other cervical disc degeneration, unspecified cervical region: Secondary | ICD-10-CM | POA: Diagnosis not present

## 2015-07-08 DIAGNOSIS — M542 Cervicalgia: Secondary | ICD-10-CM | POA: Diagnosis not present

## 2015-07-08 DIAGNOSIS — M9903 Segmental and somatic dysfunction of lumbar region: Secondary | ICD-10-CM | POA: Diagnosis not present

## 2015-07-08 DIAGNOSIS — M546 Pain in thoracic spine: Secondary | ICD-10-CM | POA: Diagnosis not present

## 2015-07-08 DIAGNOSIS — M5136 Other intervertebral disc degeneration, lumbar region: Secondary | ICD-10-CM | POA: Diagnosis not present

## 2015-07-08 DIAGNOSIS — M9901 Segmental and somatic dysfunction of cervical region: Secondary | ICD-10-CM | POA: Diagnosis not present

## 2015-07-10 DIAGNOSIS — M545 Low back pain: Secondary | ICD-10-CM | POA: Diagnosis not present

## 2015-07-10 DIAGNOSIS — M9902 Segmental and somatic dysfunction of thoracic region: Secondary | ICD-10-CM | POA: Diagnosis not present

## 2015-07-10 DIAGNOSIS — M9903 Segmental and somatic dysfunction of lumbar region: Secondary | ICD-10-CM | POA: Diagnosis not present

## 2015-07-10 DIAGNOSIS — M9901 Segmental and somatic dysfunction of cervical region: Secondary | ICD-10-CM | POA: Diagnosis not present

## 2015-07-10 DIAGNOSIS — M5136 Other intervertebral disc degeneration, lumbar region: Secondary | ICD-10-CM | POA: Diagnosis not present

## 2015-07-10 DIAGNOSIS — M542 Cervicalgia: Secondary | ICD-10-CM | POA: Diagnosis not present

## 2015-07-10 DIAGNOSIS — M546 Pain in thoracic spine: Secondary | ICD-10-CM | POA: Diagnosis not present

## 2015-07-10 DIAGNOSIS — M5134 Other intervertebral disc degeneration, thoracic region: Secondary | ICD-10-CM | POA: Diagnosis not present

## 2015-07-10 DIAGNOSIS — M5030 Other cervical disc degeneration, unspecified cervical region: Secondary | ICD-10-CM | POA: Diagnosis not present

## 2015-07-12 DIAGNOSIS — M545 Low back pain: Secondary | ICD-10-CM | POA: Diagnosis not present

## 2015-07-12 DIAGNOSIS — M546 Pain in thoracic spine: Secondary | ICD-10-CM | POA: Diagnosis not present

## 2015-07-12 DIAGNOSIS — M542 Cervicalgia: Secondary | ICD-10-CM | POA: Diagnosis not present

## 2015-07-12 DIAGNOSIS — M9903 Segmental and somatic dysfunction of lumbar region: Secondary | ICD-10-CM | POA: Diagnosis not present

## 2015-07-12 DIAGNOSIS — M5136 Other intervertebral disc degeneration, lumbar region: Secondary | ICD-10-CM | POA: Diagnosis not present

## 2015-07-12 DIAGNOSIS — M9901 Segmental and somatic dysfunction of cervical region: Secondary | ICD-10-CM | POA: Diagnosis not present

## 2015-07-12 DIAGNOSIS — M5134 Other intervertebral disc degeneration, thoracic region: Secondary | ICD-10-CM | POA: Diagnosis not present

## 2015-07-12 DIAGNOSIS — M5030 Other cervical disc degeneration, unspecified cervical region: Secondary | ICD-10-CM | POA: Diagnosis not present

## 2015-07-12 DIAGNOSIS — M9902 Segmental and somatic dysfunction of thoracic region: Secondary | ICD-10-CM | POA: Diagnosis not present

## 2015-07-15 DIAGNOSIS — M545 Low back pain: Secondary | ICD-10-CM | POA: Diagnosis not present

## 2015-07-15 DIAGNOSIS — M5030 Other cervical disc degeneration, unspecified cervical region: Secondary | ICD-10-CM | POA: Diagnosis not present

## 2015-07-15 DIAGNOSIS — M9901 Segmental and somatic dysfunction of cervical region: Secondary | ICD-10-CM | POA: Diagnosis not present

## 2015-07-15 DIAGNOSIS — M9903 Segmental and somatic dysfunction of lumbar region: Secondary | ICD-10-CM | POA: Diagnosis not present

## 2015-07-15 DIAGNOSIS — M542 Cervicalgia: Secondary | ICD-10-CM | POA: Diagnosis not present

## 2015-07-15 DIAGNOSIS — M9902 Segmental and somatic dysfunction of thoracic region: Secondary | ICD-10-CM | POA: Diagnosis not present

## 2015-07-15 DIAGNOSIS — M546 Pain in thoracic spine: Secondary | ICD-10-CM | POA: Diagnosis not present

## 2015-07-15 DIAGNOSIS — M5134 Other intervertebral disc degeneration, thoracic region: Secondary | ICD-10-CM | POA: Diagnosis not present

## 2015-07-15 DIAGNOSIS — M5136 Other intervertebral disc degeneration, lumbar region: Secondary | ICD-10-CM | POA: Diagnosis not present

## 2015-07-17 ENCOUNTER — Encounter: Payer: Self-pay | Admitting: Internal Medicine

## 2015-07-17 ENCOUNTER — Ambulatory Visit (INDEPENDENT_AMBULATORY_CARE_PROVIDER_SITE_OTHER): Payer: Medicare Other | Admitting: Internal Medicine

## 2015-07-17 VITALS — BP 120/80 | HR 62 | Temp 98.3°F | Resp 12 | Ht 67.0 in | Wt 218.0 lb

## 2015-07-17 DIAGNOSIS — Z23 Encounter for immunization: Secondary | ICD-10-CM | POA: Diagnosis not present

## 2015-07-17 DIAGNOSIS — R059 Cough, unspecified: Secondary | ICD-10-CM

## 2015-07-17 DIAGNOSIS — M9902 Segmental and somatic dysfunction of thoracic region: Secondary | ICD-10-CM | POA: Diagnosis not present

## 2015-07-17 DIAGNOSIS — M5134 Other intervertebral disc degeneration, thoracic region: Secondary | ICD-10-CM | POA: Diagnosis not present

## 2015-07-17 DIAGNOSIS — M5136 Other intervertebral disc degeneration, lumbar region: Secondary | ICD-10-CM | POA: Diagnosis not present

## 2015-07-17 DIAGNOSIS — E785 Hyperlipidemia, unspecified: Secondary | ICD-10-CM

## 2015-07-17 DIAGNOSIS — R05 Cough: Secondary | ICD-10-CM

## 2015-07-17 DIAGNOSIS — E890 Postprocedural hypothyroidism: Secondary | ICD-10-CM

## 2015-07-17 DIAGNOSIS — M5030 Other cervical disc degeneration, unspecified cervical region: Secondary | ICD-10-CM | POA: Diagnosis not present

## 2015-07-17 DIAGNOSIS — M542 Cervicalgia: Secondary | ICD-10-CM | POA: Diagnosis not present

## 2015-07-17 DIAGNOSIS — Z79899 Other long term (current) drug therapy: Secondary | ICD-10-CM | POA: Diagnosis not present

## 2015-07-17 DIAGNOSIS — M9901 Segmental and somatic dysfunction of cervical region: Secondary | ICD-10-CM | POA: Diagnosis not present

## 2015-07-17 DIAGNOSIS — M546 Pain in thoracic spine: Secondary | ICD-10-CM | POA: Diagnosis not present

## 2015-07-17 DIAGNOSIS — M9903 Segmental and somatic dysfunction of lumbar region: Secondary | ICD-10-CM | POA: Diagnosis not present

## 2015-07-17 DIAGNOSIS — E669 Obesity, unspecified: Secondary | ICD-10-CM

## 2015-07-17 DIAGNOSIS — I10 Essential (primary) hypertension: Secondary | ICD-10-CM

## 2015-07-17 DIAGNOSIS — M545 Low back pain: Secondary | ICD-10-CM | POA: Diagnosis not present

## 2015-07-17 LAB — COMPREHENSIVE METABOLIC PANEL
ALT: 18 U/L (ref 0–35)
AST: 18 U/L (ref 0–37)
Albumin: 4.4 g/dL (ref 3.5–5.2)
Alkaline Phosphatase: 63 U/L (ref 39–117)
BUN: 21 mg/dL (ref 6–23)
CHLORIDE: 103 meq/L (ref 96–112)
CO2: 24 meq/L (ref 19–32)
Calcium: 9.1 mg/dL (ref 8.4–10.5)
Creatinine, Ser: 0.7 mg/dL (ref 0.40–1.20)
GFR: 88.42 mL/min (ref >60.00)
GLUCOSE: 86 mg/dL (ref 70–99)
POTASSIUM: 4 meq/L (ref 3.5–5.1)
Sodium: 140 mEq/L (ref 135–145)
Total Bilirubin: 1 mg/dL (ref 0.2–1.2)
Total Protein: 6.9 g/dL (ref 6.0–8.3)

## 2015-07-17 LAB — LDL CHOLESTEROL, DIRECT: Direct LDL: 103 mg/dL

## 2015-07-17 MED ORDER — ATORVASTATIN CALCIUM 40 MG PO TABS: 40.0000 mg | ORAL_TABLET | Freq: Every day | ORAL | Status: DC

## 2015-07-17 NOTE — Patient Instructions (Signed)
I'm glad you are feeling better !!  I will refill your thyroid medication once I confirm that we are at goal  Your  Body mass index is 34.14 kg/(m^2).   Your first goal is to lose 20 lbs (in 6 months) by your next visit

## 2015-07-17 NOTE — Progress Notes (Signed)
Subjective:  Patient ID: Julie Perez, female    DOB: 1946/07/15  Age: 69 y.o. MRN: [id number]  CC: The primary encounter diagnosis was Encounter for immunization. Diagnoses of Hyperlipidemia LDL goal <130, Long-term use of high-risk medication, Hypothyroidism, postradioiodine therapy, Essential hypertension, Cough, and Obesity were also pertinent to this visit.  HPI Julie Perez presents for follow up on cough and hypothyroidism,.  She states that her cough has resolved on her current regimen.She is sleeping well.  Her energy level is excellent  And she feels better than she has in years..  Outpatient Prescriptions Prior to Visit  Medication Sig Dispense Refill  . ALPRAZolam (XANAX) 0.25 MG tablet Take 1 tablet (0.25 mg total) by mouth at bedtime as needed for sleep. 30 tablet 2  . amLODipine (NORVASC) 2.5 MG tablet TAKE 1 TABLET (2.5 MG TOTAL) BY MOUTH AT BEDTIME. 90 tablet 1  . benzonatate (TESSALON) 200 MG capsule Take 1 capsule (200 mg total) by mouth 3 (three) times daily as needed for cough. 60 capsule 2  . Cholecalciferol (VITAMIN D3) 1000 UNITS CAPS Take 1 capsule by mouth daily.    . citalopram (CELEXA) 40 MG tablet TAKE 1 TABLET DAILY 90 tablet 1  . Cyanocobalamin (VITAMIN B-12) 2500 MCG SUBL Place 1 tablet under the tongue daily.    Marland Kitchen levothyroxine (SYNTHROID, LEVOTHROID) 112 MCG tablet Take 1 tablet (112 mcg total) by mouth daily before breakfast. 6 days per week and 100 mcg one day per week 90 tablet 1  . Magnesium 250 MG TABS Take 250 mg by mouth daily.    Marland Kitchen omeprazole (PRILOSEC) 40 MG capsule Take 1 capsule (40 mg total) by mouth daily. 30 capsule 3  . atorvastatin (LIPITOR) 80 MG tablet Take 1 tablet (80 mg total) by mouth daily at 6 PM. 90 tablet 1   No facility-administered medications prior to visit.    Review of Systems;  Patient denies headache, fevers, malaise, unintentional weight loss, skin rash, eye pain, sinus congestion and sinus pain, sore throat,  dysphagia,  hemoptysis , cough, dyspnea, wheezing, chest pain, palpitations, orthopnea, edema, abdominal pain, nausea, melena, diarrhea, constipation, flank pain, dysuria, hematuria, urinary  Frequency, nocturia, numbness, tingling, seizures,  Focal weakness, Loss of consciousness,  Tremor, insomnia, depression, anxiety, and suicidal ideation.      Objective:  BP 120/80 mmHg  Pulse 62  Temp(Src) 98.3 F (36.8 C) (Oral)  Resp 12  Ht 5\' 7"  (1.702 m)  Wt 218 lb (98.884 kg)  BMI 34.14 kg/m2  SpO2 97%  BP Readings from Last 3 Encounters:  07/17/15 120/80  05/23/15 110/68  04/17/15 126/68    Wt Readings from Last 3 Encounters:  07/17/15 218 lb (98.884 kg)  05/23/15 220 lb (99.791 kg)  04/17/15 218 lb 8 oz (99.111 kg)    General appearance: alert, cooperative and appears stated age Ears: normal TM's and external ear canals both ears Throat: lips, mucosa, and tongue normal; teeth and gums normal Neck: no adenopathy, no carotid bruit, supple, symmetrical, trachea midline and thyroid not enlarged, symmetric, no tenderness/mass/nodules Back: symmetric, no curvature. ROM normal. No CVA tenderness. Lungs: clear to auscultation bilaterally Heart: regular rate and rhythm, S1, S2 normal, no murmur, click, rub or gallop Abdomen: soft, non-tender; bowel sounds normal; no masses,  no organomegaly Pulses: 2+ and symmetric Skin: Skin color, texture, turgor normal. No rashes or lesions Lymph nodes: Cervical, supraclavicular, and axillary nodes normal.  No results found for: HGBA1C  Lab Results  Component Value  Date   CREATININE 0.70 07/17/2015   CREATININE 0.64 03/18/2015   CREATININE 0.7 11/19/2014    Lab Results  Component Value Date   WBC 6.8 03/18/2015   HGB 13.8 03/18/2015   HCT 40.6 03/18/2015   PLT 230.0 03/18/2015   GLUCOSE 86 07/17/2015   CHOL 171 03/18/2015   TRIG 157.0* 03/18/2015   HDL 48.50 03/18/2015   LDLDIRECT 103.0 07/17/2015   LDLCALC 91 03/18/2015   ALT 18  07/17/2015   AST 18 07/17/2015   NA 140 07/17/2015   K 4.0 07/17/2015   CL 103 07/17/2015   CREATININE 0.70 07/17/2015   BUN 21 07/17/2015   CO2 24 07/17/2015   TSH 2.99 07/17/2015    Dg Chest 2 View  03/18/2015   CLINICAL DATA:  Dry cough.  EXAM: CHEST  2 VIEW  COMPARISON:  None.  FINDINGS: Mediastinum hilar structures are normal. Low lung volumes with mild bibasilar atelectasis. Cardiomegaly with normal pulmonary vascularity. No pleural effusion or pneumothorax.  IMPRESSION: 1. Mild cardiomegaly.  No CHF. 2. No acute pulmonary disease.   Electronically Signed   By: Marcello Moores  Register   On: 03/18/2015 10:59    Assessment & Plan:   Problem List Items Addressed This Visit      Unprioritized   Hypothyroidism, postradioiodine therapy    Thyroid function is more active on current dose of levothyroxine 1101mcg. Would normally  TSH < 1.0 given history of hyperthyroidism requiring ablatiion, but she currenlty feels better than she has in years. No changes today   Lab Results  Component Value Date   TSH 2.99 07/17/2015            Relevant Orders   TSH (Completed)   RESOLVED: Obesity    I have addressed  BMI and recommended wt loss of 10% of body weigh over the next 6 months using a low glycemic index diet and regular exercise a minimum of 5 days per week.        Cough    Resolved with treatment of GERD.      Essential hypertension    Well controlled on current regimen. Renal function stable, no changes today.  Lab Results  Component Value Date   CREATININE 0.70 07/17/2015   Lab Results  Component Value Date   NA 140 07/17/2015   K 4.0 07/17/2015   CL 103 07/17/2015   CO2 24 07/17/2015         Relevant Medications   atorvastatin (LIPITOR) 40 MG tablet   Hyperlipidemia LDL goal <130   Relevant Medications   atorvastatin (LIPITOR) 40 MG tablet   Other Relevant Orders   LDL cholesterol, direct (Completed)   Long-term use of high-risk medication   Relevant Orders     Comprehensive metabolic panel (Completed)    Other Visit Diagnoses    Encounter for immunization    -  Primary       I have changed Ms. Barrell's atorvastatin. I am also having her maintain her Vitamin D3, ALPRAZolam, amLODipine, Magnesium, Vitamin B-12, benzonatate, levothyroxine, omeprazole, citalopram, and Fish Oil.  Meds ordered this encounter  Medications  . Omega-3 Fatty Acids (FISH OIL) 1200 MG CPDR    Sig: Take 1 capsule by mouth 3 (three) times daily.  Marland Kitchen atorvastatin (LIPITOR) 40 MG tablet    Sig: Take 1 tablet (40 mg total) by mouth daily at 6 PM.    Dispense:  90 tablet    Refill:  2    Medications Discontinued During This  Encounter  Medication Reason  . atorvastatin (LIPITOR) 80 MG tablet Reorder    Follow-up: No Follow-up on file.   Crecencio Mc, MD

## 2015-07-17 NOTE — Progress Notes (Signed)
Pre-visit discussion using our clinic review tool. No additional management support is needed unless otherwise documented below in the visit note.  

## 2015-07-18 LAB — TSH: TSH: 2.99 u[IU]/mL (ref 0.35–4.50)

## 2015-07-19 ENCOUNTER — Encounter: Payer: Self-pay | Admitting: Internal Medicine

## 2015-07-19 DIAGNOSIS — M545 Low back pain: Secondary | ICD-10-CM | POA: Diagnosis not present

## 2015-07-19 DIAGNOSIS — M5136 Other intervertebral disc degeneration, lumbar region: Secondary | ICD-10-CM | POA: Diagnosis not present

## 2015-07-19 DIAGNOSIS — M9903 Segmental and somatic dysfunction of lumbar region: Secondary | ICD-10-CM | POA: Diagnosis not present

## 2015-07-19 DIAGNOSIS — M9902 Segmental and somatic dysfunction of thoracic region: Secondary | ICD-10-CM | POA: Diagnosis not present

## 2015-07-19 DIAGNOSIS — M9901 Segmental and somatic dysfunction of cervical region: Secondary | ICD-10-CM | POA: Diagnosis not present

## 2015-07-19 DIAGNOSIS — M5134 Other intervertebral disc degeneration, thoracic region: Secondary | ICD-10-CM | POA: Diagnosis not present

## 2015-07-19 DIAGNOSIS — M5030 Other cervical disc degeneration, unspecified cervical region: Secondary | ICD-10-CM | POA: Diagnosis not present

## 2015-07-19 DIAGNOSIS — M542 Cervicalgia: Secondary | ICD-10-CM | POA: Diagnosis not present

## 2015-07-19 DIAGNOSIS — M546 Pain in thoracic spine: Secondary | ICD-10-CM | POA: Diagnosis not present

## 2015-07-19 NOTE — Assessment & Plan Note (Signed)
Resolved with treatment of GERD.

## 2015-07-19 NOTE — Assessment & Plan Note (Signed)
Well controlled on current regimen. Renal function stable, no changes today.  Lab Results  Component Value Date   CREATININE 0.70 07/17/2015   Lab Results  Component Value Date   NA 140 07/17/2015   K 4.0 07/17/2015   CL 103 07/17/2015   CO2 24 07/17/2015

## 2015-07-19 NOTE — Assessment & Plan Note (Signed)
Thyroid function is more active on current dose of levothyroxine 158mcg. Would normally  TSH < 1.0 given history of hyperthyroidism requiring ablatiion, but she currenlty feels better than she has in years. No changes today   Lab Results  Component Value Date   TSH 2.99 07/17/2015

## 2015-07-19 NOTE — Assessment & Plan Note (Signed)
I have addressed  BMI and recommended wt loss of 10% of body weigh over the next 6 months using a low glycemic index diet and regular exercise a minimum of 5 days per week.   

## 2015-07-22 DIAGNOSIS — M5136 Other intervertebral disc degeneration, lumbar region: Secondary | ICD-10-CM | POA: Diagnosis not present

## 2015-07-22 DIAGNOSIS — M9903 Segmental and somatic dysfunction of lumbar region: Secondary | ICD-10-CM | POA: Diagnosis not present

## 2015-07-22 DIAGNOSIS — M545 Low back pain: Secondary | ICD-10-CM | POA: Diagnosis not present

## 2015-07-22 DIAGNOSIS — M9901 Segmental and somatic dysfunction of cervical region: Secondary | ICD-10-CM | POA: Diagnosis not present

## 2015-07-22 DIAGNOSIS — M546 Pain in thoracic spine: Secondary | ICD-10-CM | POA: Diagnosis not present

## 2015-07-22 DIAGNOSIS — M5134 Other intervertebral disc degeneration, thoracic region: Secondary | ICD-10-CM | POA: Diagnosis not present

## 2015-07-22 DIAGNOSIS — M5030 Other cervical disc degeneration, unspecified cervical region: Secondary | ICD-10-CM | POA: Diagnosis not present

## 2015-07-22 DIAGNOSIS — M542 Cervicalgia: Secondary | ICD-10-CM | POA: Diagnosis not present

## 2015-07-22 DIAGNOSIS — M9902 Segmental and somatic dysfunction of thoracic region: Secondary | ICD-10-CM | POA: Diagnosis not present

## 2015-07-24 DIAGNOSIS — M9901 Segmental and somatic dysfunction of cervical region: Secondary | ICD-10-CM | POA: Diagnosis not present

## 2015-07-24 DIAGNOSIS — M545 Low back pain: Secondary | ICD-10-CM | POA: Diagnosis not present

## 2015-07-24 DIAGNOSIS — M9903 Segmental and somatic dysfunction of lumbar region: Secondary | ICD-10-CM | POA: Diagnosis not present

## 2015-07-24 DIAGNOSIS — M9902 Segmental and somatic dysfunction of thoracic region: Secondary | ICD-10-CM | POA: Diagnosis not present

## 2015-07-24 DIAGNOSIS — M5136 Other intervertebral disc degeneration, lumbar region: Secondary | ICD-10-CM | POA: Diagnosis not present

## 2015-07-24 DIAGNOSIS — M5030 Other cervical disc degeneration, unspecified cervical region: Secondary | ICD-10-CM | POA: Diagnosis not present

## 2015-07-24 DIAGNOSIS — M546 Pain in thoracic spine: Secondary | ICD-10-CM | POA: Diagnosis not present

## 2015-07-24 DIAGNOSIS — M542 Cervicalgia: Secondary | ICD-10-CM | POA: Diagnosis not present

## 2015-07-24 DIAGNOSIS — M5134 Other intervertebral disc degeneration, thoracic region: Secondary | ICD-10-CM | POA: Diagnosis not present

## 2015-07-26 DIAGNOSIS — M5136 Other intervertebral disc degeneration, lumbar region: Secondary | ICD-10-CM | POA: Diagnosis not present

## 2015-07-26 DIAGNOSIS — M9903 Segmental and somatic dysfunction of lumbar region: Secondary | ICD-10-CM | POA: Diagnosis not present

## 2015-07-26 DIAGNOSIS — M545 Low back pain: Secondary | ICD-10-CM | POA: Diagnosis not present

## 2015-07-26 DIAGNOSIS — M542 Cervicalgia: Secondary | ICD-10-CM | POA: Diagnosis not present

## 2015-07-26 DIAGNOSIS — M5030 Other cervical disc degeneration, unspecified cervical region: Secondary | ICD-10-CM | POA: Diagnosis not present

## 2015-07-26 DIAGNOSIS — M9902 Segmental and somatic dysfunction of thoracic region: Secondary | ICD-10-CM | POA: Diagnosis not present

## 2015-07-26 DIAGNOSIS — M9901 Segmental and somatic dysfunction of cervical region: Secondary | ICD-10-CM | POA: Diagnosis not present

## 2015-07-26 DIAGNOSIS — M5134 Other intervertebral disc degeneration, thoracic region: Secondary | ICD-10-CM | POA: Diagnosis not present

## 2015-07-26 DIAGNOSIS — M546 Pain in thoracic spine: Secondary | ICD-10-CM | POA: Diagnosis not present

## 2015-07-31 DIAGNOSIS — M5136 Other intervertebral disc degeneration, lumbar region: Secondary | ICD-10-CM | POA: Diagnosis not present

## 2015-07-31 DIAGNOSIS — M546 Pain in thoracic spine: Secondary | ICD-10-CM | POA: Diagnosis not present

## 2015-07-31 DIAGNOSIS — M9902 Segmental and somatic dysfunction of thoracic region: Secondary | ICD-10-CM | POA: Diagnosis not present

## 2015-07-31 DIAGNOSIS — M545 Low back pain: Secondary | ICD-10-CM | POA: Diagnosis not present

## 2015-07-31 DIAGNOSIS — M5134 Other intervertebral disc degeneration, thoracic region: Secondary | ICD-10-CM | POA: Diagnosis not present

## 2015-07-31 DIAGNOSIS — M9901 Segmental and somatic dysfunction of cervical region: Secondary | ICD-10-CM | POA: Diagnosis not present

## 2015-07-31 DIAGNOSIS — M9903 Segmental and somatic dysfunction of lumbar region: Secondary | ICD-10-CM | POA: Diagnosis not present

## 2015-07-31 DIAGNOSIS — M5030 Other cervical disc degeneration, unspecified cervical region: Secondary | ICD-10-CM | POA: Diagnosis not present

## 2015-07-31 DIAGNOSIS — M542 Cervicalgia: Secondary | ICD-10-CM | POA: Diagnosis not present

## 2015-08-02 DIAGNOSIS — M9903 Segmental and somatic dysfunction of lumbar region: Secondary | ICD-10-CM | POA: Diagnosis not present

## 2015-08-02 DIAGNOSIS — M546 Pain in thoracic spine: Secondary | ICD-10-CM | POA: Diagnosis not present

## 2015-08-02 DIAGNOSIS — M9902 Segmental and somatic dysfunction of thoracic region: Secondary | ICD-10-CM | POA: Diagnosis not present

## 2015-08-02 DIAGNOSIS — M5134 Other intervertebral disc degeneration, thoracic region: Secondary | ICD-10-CM | POA: Diagnosis not present

## 2015-08-02 DIAGNOSIS — M542 Cervicalgia: Secondary | ICD-10-CM | POA: Diagnosis not present

## 2015-08-02 DIAGNOSIS — M5136 Other intervertebral disc degeneration, lumbar region: Secondary | ICD-10-CM | POA: Diagnosis not present

## 2015-08-02 DIAGNOSIS — M9901 Segmental and somatic dysfunction of cervical region: Secondary | ICD-10-CM | POA: Diagnosis not present

## 2015-08-02 DIAGNOSIS — M5030 Other cervical disc degeneration, unspecified cervical region: Secondary | ICD-10-CM | POA: Diagnosis not present

## 2015-08-02 DIAGNOSIS — M545 Low back pain: Secondary | ICD-10-CM | POA: Diagnosis not present

## 2015-08-05 DIAGNOSIS — M542 Cervicalgia: Secondary | ICD-10-CM | POA: Diagnosis not present

## 2015-08-05 DIAGNOSIS — M5030 Other cervical disc degeneration, unspecified cervical region: Secondary | ICD-10-CM | POA: Diagnosis not present

## 2015-08-05 DIAGNOSIS — M5134 Other intervertebral disc degeneration, thoracic region: Secondary | ICD-10-CM | POA: Diagnosis not present

## 2015-08-05 DIAGNOSIS — M545 Low back pain: Secondary | ICD-10-CM | POA: Diagnosis not present

## 2015-08-05 DIAGNOSIS — M9902 Segmental and somatic dysfunction of thoracic region: Secondary | ICD-10-CM | POA: Diagnosis not present

## 2015-08-05 DIAGNOSIS — M546 Pain in thoracic spine: Secondary | ICD-10-CM | POA: Diagnosis not present

## 2015-08-05 DIAGNOSIS — M9901 Segmental and somatic dysfunction of cervical region: Secondary | ICD-10-CM | POA: Diagnosis not present

## 2015-08-05 DIAGNOSIS — M9903 Segmental and somatic dysfunction of lumbar region: Secondary | ICD-10-CM | POA: Diagnosis not present

## 2015-08-05 DIAGNOSIS — M5136 Other intervertebral disc degeneration, lumbar region: Secondary | ICD-10-CM | POA: Diagnosis not present

## 2015-08-09 DIAGNOSIS — M542 Cervicalgia: Secondary | ICD-10-CM | POA: Diagnosis not present

## 2015-08-09 DIAGNOSIS — M5030 Other cervical disc degeneration, unspecified cervical region: Secondary | ICD-10-CM | POA: Diagnosis not present

## 2015-08-09 DIAGNOSIS — M545 Low back pain: Secondary | ICD-10-CM | POA: Diagnosis not present

## 2015-08-09 DIAGNOSIS — M9903 Segmental and somatic dysfunction of lumbar region: Secondary | ICD-10-CM | POA: Diagnosis not present

## 2015-08-09 DIAGNOSIS — M9902 Segmental and somatic dysfunction of thoracic region: Secondary | ICD-10-CM | POA: Diagnosis not present

## 2015-08-09 DIAGNOSIS — M5136 Other intervertebral disc degeneration, lumbar region: Secondary | ICD-10-CM | POA: Diagnosis not present

## 2015-08-09 DIAGNOSIS — M9901 Segmental and somatic dysfunction of cervical region: Secondary | ICD-10-CM | POA: Diagnosis not present

## 2015-08-09 DIAGNOSIS — M546 Pain in thoracic spine: Secondary | ICD-10-CM | POA: Diagnosis not present

## 2015-08-09 DIAGNOSIS — M5134 Other intervertebral disc degeneration, thoracic region: Secondary | ICD-10-CM | POA: Diagnosis not present

## 2015-08-12 DIAGNOSIS — M546 Pain in thoracic spine: Secondary | ICD-10-CM | POA: Diagnosis not present

## 2015-08-12 DIAGNOSIS — M9902 Segmental and somatic dysfunction of thoracic region: Secondary | ICD-10-CM | POA: Diagnosis not present

## 2015-08-12 DIAGNOSIS — M545 Low back pain: Secondary | ICD-10-CM | POA: Diagnosis not present

## 2015-08-12 DIAGNOSIS — M5136 Other intervertebral disc degeneration, lumbar region: Secondary | ICD-10-CM | POA: Diagnosis not present

## 2015-08-12 DIAGNOSIS — M9901 Segmental and somatic dysfunction of cervical region: Secondary | ICD-10-CM | POA: Diagnosis not present

## 2015-08-12 DIAGNOSIS — M542 Cervicalgia: Secondary | ICD-10-CM | POA: Diagnosis not present

## 2015-08-12 DIAGNOSIS — M5030 Other cervical disc degeneration, unspecified cervical region: Secondary | ICD-10-CM | POA: Diagnosis not present

## 2015-08-12 DIAGNOSIS — M5134 Other intervertebral disc degeneration, thoracic region: Secondary | ICD-10-CM | POA: Diagnosis not present

## 2015-08-12 DIAGNOSIS — M9903 Segmental and somatic dysfunction of lumbar region: Secondary | ICD-10-CM | POA: Diagnosis not present

## 2015-08-16 DIAGNOSIS — M9903 Segmental and somatic dysfunction of lumbar region: Secondary | ICD-10-CM | POA: Diagnosis not present

## 2015-08-16 DIAGNOSIS — M9901 Segmental and somatic dysfunction of cervical region: Secondary | ICD-10-CM | POA: Diagnosis not present

## 2015-08-16 DIAGNOSIS — M5134 Other intervertebral disc degeneration, thoracic region: Secondary | ICD-10-CM | POA: Diagnosis not present

## 2015-08-16 DIAGNOSIS — M5030 Other cervical disc degeneration, unspecified cervical region: Secondary | ICD-10-CM | POA: Diagnosis not present

## 2015-08-16 DIAGNOSIS — M545 Low back pain: Secondary | ICD-10-CM | POA: Diagnosis not present

## 2015-08-16 DIAGNOSIS — M5136 Other intervertebral disc degeneration, lumbar region: Secondary | ICD-10-CM | POA: Diagnosis not present

## 2015-08-16 DIAGNOSIS — M9902 Segmental and somatic dysfunction of thoracic region: Secondary | ICD-10-CM | POA: Diagnosis not present

## 2015-08-16 DIAGNOSIS — M546 Pain in thoracic spine: Secondary | ICD-10-CM | POA: Diagnosis not present

## 2015-08-16 DIAGNOSIS — M542 Cervicalgia: Secondary | ICD-10-CM | POA: Diagnosis not present

## 2015-08-19 DIAGNOSIS — M9901 Segmental and somatic dysfunction of cervical region: Secondary | ICD-10-CM | POA: Diagnosis not present

## 2015-08-19 DIAGNOSIS — M5136 Other intervertebral disc degeneration, lumbar region: Secondary | ICD-10-CM | POA: Diagnosis not present

## 2015-08-19 DIAGNOSIS — M9902 Segmental and somatic dysfunction of thoracic region: Secondary | ICD-10-CM | POA: Diagnosis not present

## 2015-08-19 DIAGNOSIS — M546 Pain in thoracic spine: Secondary | ICD-10-CM | POA: Diagnosis not present

## 2015-08-19 DIAGNOSIS — M545 Low back pain: Secondary | ICD-10-CM | POA: Diagnosis not present

## 2015-08-19 DIAGNOSIS — M542 Cervicalgia: Secondary | ICD-10-CM | POA: Diagnosis not present

## 2015-08-19 DIAGNOSIS — M9903 Segmental and somatic dysfunction of lumbar region: Secondary | ICD-10-CM | POA: Diagnosis not present

## 2015-08-19 DIAGNOSIS — M5030 Other cervical disc degeneration, unspecified cervical region: Secondary | ICD-10-CM | POA: Diagnosis not present

## 2015-08-19 DIAGNOSIS — M5134 Other intervertebral disc degeneration, thoracic region: Secondary | ICD-10-CM | POA: Diagnosis not present

## 2015-08-26 DIAGNOSIS — M545 Low back pain: Secondary | ICD-10-CM | POA: Diagnosis not present

## 2015-08-26 DIAGNOSIS — M542 Cervicalgia: Secondary | ICD-10-CM | POA: Diagnosis not present

## 2015-08-26 DIAGNOSIS — M9902 Segmental and somatic dysfunction of thoracic region: Secondary | ICD-10-CM | POA: Diagnosis not present

## 2015-08-26 DIAGNOSIS — M5030 Other cervical disc degeneration, unspecified cervical region: Secondary | ICD-10-CM | POA: Diagnosis not present

## 2015-08-26 DIAGNOSIS — M9903 Segmental and somatic dysfunction of lumbar region: Secondary | ICD-10-CM | POA: Diagnosis not present

## 2015-08-26 DIAGNOSIS — M9901 Segmental and somatic dysfunction of cervical region: Secondary | ICD-10-CM | POA: Diagnosis not present

## 2015-08-26 DIAGNOSIS — M546 Pain in thoracic spine: Secondary | ICD-10-CM | POA: Diagnosis not present

## 2015-08-26 DIAGNOSIS — M5134 Other intervertebral disc degeneration, thoracic region: Secondary | ICD-10-CM | POA: Diagnosis not present

## 2015-08-26 DIAGNOSIS — M5136 Other intervertebral disc degeneration, lumbar region: Secondary | ICD-10-CM | POA: Diagnosis not present

## 2015-09-03 ENCOUNTER — Other Ambulatory Visit: Payer: Self-pay | Admitting: Internal Medicine

## 2015-09-04 DIAGNOSIS — M5134 Other intervertebral disc degeneration, thoracic region: Secondary | ICD-10-CM | POA: Diagnosis not present

## 2015-09-04 DIAGNOSIS — M5030 Other cervical disc degeneration, unspecified cervical region: Secondary | ICD-10-CM | POA: Diagnosis not present

## 2015-09-04 DIAGNOSIS — M545 Low back pain: Secondary | ICD-10-CM | POA: Diagnosis not present

## 2015-09-04 DIAGNOSIS — M9901 Segmental and somatic dysfunction of cervical region: Secondary | ICD-10-CM | POA: Diagnosis not present

## 2015-09-04 DIAGNOSIS — M542 Cervicalgia: Secondary | ICD-10-CM | POA: Diagnosis not present

## 2015-09-04 DIAGNOSIS — M5136 Other intervertebral disc degeneration, lumbar region: Secondary | ICD-10-CM | POA: Diagnosis not present

## 2015-09-04 DIAGNOSIS — M9903 Segmental and somatic dysfunction of lumbar region: Secondary | ICD-10-CM | POA: Diagnosis not present

## 2015-09-04 DIAGNOSIS — M546 Pain in thoracic spine: Secondary | ICD-10-CM | POA: Diagnosis not present

## 2015-09-04 DIAGNOSIS — M9902 Segmental and somatic dysfunction of thoracic region: Secondary | ICD-10-CM | POA: Diagnosis not present

## 2015-09-09 ENCOUNTER — Ambulatory Visit (INDEPENDENT_AMBULATORY_CARE_PROVIDER_SITE_OTHER): Payer: Medicare Other | Admitting: Family Medicine

## 2015-09-09 ENCOUNTER — Encounter: Payer: Self-pay | Admitting: Family Medicine

## 2015-09-09 VITALS — BP 120/60 | HR 66 | Temp 98.1°F | Ht 67.0 in | Wt 215.5 lb

## 2015-09-09 DIAGNOSIS — N3000 Acute cystitis without hematuria: Secondary | ICD-10-CM | POA: Diagnosis not present

## 2015-09-09 DIAGNOSIS — N390 Urinary tract infection, site not specified: Secondary | ICD-10-CM | POA: Insufficient documentation

## 2015-09-09 DIAGNOSIS — N3001 Acute cystitis with hematuria: Secondary | ICD-10-CM | POA: Diagnosis not present

## 2015-09-09 LAB — POCT URINALYSIS DIPSTICK
Bilirubin, UA: NEGATIVE
Glucose, UA: NEGATIVE
Ketones, UA: NEGATIVE
NITRITE UA: POSITIVE
PH UA: 5.5
PROTEIN UA: 100
Spec Grav, UA: 1.025
Urobilinogen, UA: 1

## 2015-09-09 MED ORDER — CIPROFLOXACIN HCL 500 MG PO TABS: 500.0000 mg | ORAL_TABLET | Freq: Two times a day (BID) | ORAL | Status: DC

## 2015-09-09 NOTE — Patient Instructions (Signed)
Take the antibiotic as prescribed.  Call if you worsen or fail to improve.  Take care  Dr.Olevia Westervelt

## 2015-09-09 NOTE — Progress Notes (Signed)
Subjective:  Patient ID: Julie Perez, female    DOB: 12-29-1945  Age: 69 y.o. MRN: [id number]  CC: UTI  HPI:  69 year old female presents to clinic today for an acute visit with complaints of possible UTI.  Patient reports she has had urinary urgency, frequency, and burning with urination for approximately 2 weeks. She reports associated foul-smelling urine. She denies any fever, new back pain, chills. She states that she is otherwise feeling well. She has noticed that she's been quite irritable recently. No exacerbating or relieving factors. No interventions tried.  Social Hx   Social History   Social History  . Marital Status: Married    Spouse Name: N/A  . Number of Children: N/A  . Years of Education: N/A   Social History Main Topics  . Smoking status: Never Smoker   . Smokeless tobacco: Never Used  . Alcohol Use: 4.2 oz/week    7 Glasses of wine per week     Comment: occasionally  . Drug Use: No  . Sexual Activity: No   Other Topics Concern  . None   Social History Narrative   Review of Systems  Constitutional: Negative for fever and chills.  Genitourinary: Positive for dysuria, urgency and frequency. Negative for flank pain.   Objective:  BP 120/60 mmHg  Pulse 66  Temp(Src) 98.1 F (36.7 C) (Oral)  Ht 5\' 7"  (1.702 m)  Wt 215 lb 8 oz (97.75 kg)  BMI 33.74 kg/m2  SpO2 96%  BP/Weight 09/09/2015 07/17/2015 03/28/3975  Systolic BP 734 193 790  Diastolic BP 60 80 68  Wt. (Lbs) 215.5 218 220  BMI 33.74 34.14 34.45   Physical Exam  Constitutional: She appears well-developed and well-nourished. No distress.  Cardiovascular: Normal rate and regular rhythm.   No murmur heard. Pulmonary/Chest: Effort normal and breath sounds normal. No respiratory distress. She has no wheezes. She has no rales.  Abdominal: Soft. She exhibits no distension. There is no tenderness. There is no rebound and no guarding.  Neurological: She is alert.  Psychiatric: She has a  normal mood and affect.  Vitals reviewed.  Results for orders placed or performed in visit on 09/09/15 (from the past 24 hour(s))  POCT Urinalysis Dipstick     Status: Abnormal   Collection Time: 09/09/15  9:38 AM  Result Value Ref Range   Color, UA yellow    Clarity, UA cloudy    Glucose, UA neg    Bilirubin, UA neg    Ketones, UA neg    Spec Grav, UA 1.025    Blood, UA Large    pH, UA 5.5    Protein, UA 100    Urobilinogen, UA 1.0    Nitrite, UA positive    Leukocytes, UA small (1+) (A) Negative   Assessment & Plan:   Problem List Items Addressed This Visit    UTI (urinary tract infection) - Primary    Urinalysis revealed positive nitrite, small leukocyte, and large blood. This is consistent with UTI. Obtaining culture and treating with Cipro. Rx given today.       Relevant Orders   POCT Urinalysis Dipstick (Completed)   Urine culture      Meds ordered this encounter  Medications  . ciprofloxacin (CIPRO) 500 MG tablet    Sig: Take 1 tablet (500 mg total) by mouth 2 (two) times daily.    Dispense:  14 tablet    Refill:  0    Follow-up: PRN  Thersa Salt, DO

## 2015-09-09 NOTE — Progress Notes (Signed)
Pre visit review using our clinic review tool, if applicable. No additional management support is needed unless otherwise documented below in the visit note. 

## 2015-09-09 NOTE — Assessment & Plan Note (Signed)
Urinalysis revealed positive nitrite, small leukocyte, and large blood. This is consistent with UTI. Obtaining culture and treating with Cipro. Rx given today.

## 2015-09-12 LAB — URINE CULTURE

## 2015-09-16 DIAGNOSIS — M9901 Segmental and somatic dysfunction of cervical region: Secondary | ICD-10-CM | POA: Diagnosis not present

## 2015-09-16 DIAGNOSIS — M5136 Other intervertebral disc degeneration, lumbar region: Secondary | ICD-10-CM | POA: Diagnosis not present

## 2015-09-16 DIAGNOSIS — M9903 Segmental and somatic dysfunction of lumbar region: Secondary | ICD-10-CM | POA: Diagnosis not present

## 2015-09-16 DIAGNOSIS — M5134 Other intervertebral disc degeneration, thoracic region: Secondary | ICD-10-CM | POA: Diagnosis not present

## 2015-09-16 DIAGNOSIS — M545 Low back pain: Secondary | ICD-10-CM | POA: Diagnosis not present

## 2015-09-16 DIAGNOSIS — M542 Cervicalgia: Secondary | ICD-10-CM | POA: Diagnosis not present

## 2015-09-16 DIAGNOSIS — M9902 Segmental and somatic dysfunction of thoracic region: Secondary | ICD-10-CM | POA: Diagnosis not present

## 2015-09-16 DIAGNOSIS — M5030 Other cervical disc degeneration, unspecified cervical region: Secondary | ICD-10-CM | POA: Diagnosis not present

## 2015-09-16 DIAGNOSIS — M546 Pain in thoracic spine: Secondary | ICD-10-CM | POA: Diagnosis not present

## 2015-09-30 DIAGNOSIS — M545 Low back pain: Secondary | ICD-10-CM | POA: Diagnosis not present

## 2015-09-30 DIAGNOSIS — M9901 Segmental and somatic dysfunction of cervical region: Secondary | ICD-10-CM | POA: Diagnosis not present

## 2015-09-30 DIAGNOSIS — M9903 Segmental and somatic dysfunction of lumbar region: Secondary | ICD-10-CM | POA: Diagnosis not present

## 2015-09-30 DIAGNOSIS — M542 Cervicalgia: Secondary | ICD-10-CM | POA: Diagnosis not present

## 2015-09-30 DIAGNOSIS — M5134 Other intervertebral disc degeneration, thoracic region: Secondary | ICD-10-CM | POA: Diagnosis not present

## 2015-09-30 DIAGNOSIS — M5136 Other intervertebral disc degeneration, lumbar region: Secondary | ICD-10-CM | POA: Diagnosis not present

## 2015-09-30 DIAGNOSIS — M546 Pain in thoracic spine: Secondary | ICD-10-CM | POA: Diagnosis not present

## 2015-09-30 DIAGNOSIS — M5030 Other cervical disc degeneration, unspecified cervical region: Secondary | ICD-10-CM | POA: Diagnosis not present

## 2015-09-30 DIAGNOSIS — M9902 Segmental and somatic dysfunction of thoracic region: Secondary | ICD-10-CM | POA: Diagnosis not present

## 2015-10-14 DIAGNOSIS — M546 Pain in thoracic spine: Secondary | ICD-10-CM | POA: Diagnosis not present

## 2015-10-14 DIAGNOSIS — M5136 Other intervertebral disc degeneration, lumbar region: Secondary | ICD-10-CM | POA: Diagnosis not present

## 2015-10-14 DIAGNOSIS — M9901 Segmental and somatic dysfunction of cervical region: Secondary | ICD-10-CM | POA: Diagnosis not present

## 2015-10-14 DIAGNOSIS — M542 Cervicalgia: Secondary | ICD-10-CM | POA: Diagnosis not present

## 2015-10-14 DIAGNOSIS — M5030 Other cervical disc degeneration, unspecified cervical region: Secondary | ICD-10-CM | POA: Diagnosis not present

## 2015-10-14 DIAGNOSIS — M5134 Other intervertebral disc degeneration, thoracic region: Secondary | ICD-10-CM | POA: Diagnosis not present

## 2015-10-14 DIAGNOSIS — M9903 Segmental and somatic dysfunction of lumbar region: Secondary | ICD-10-CM | POA: Diagnosis not present

## 2015-10-14 DIAGNOSIS — M9902 Segmental and somatic dysfunction of thoracic region: Secondary | ICD-10-CM | POA: Diagnosis not present

## 2015-10-14 DIAGNOSIS — M545 Low back pain: Secondary | ICD-10-CM | POA: Diagnosis not present

## 2015-11-04 DIAGNOSIS — M9903 Segmental and somatic dysfunction of lumbar region: Secondary | ICD-10-CM | POA: Diagnosis not present

## 2015-11-04 DIAGNOSIS — M546 Pain in thoracic spine: Secondary | ICD-10-CM | POA: Diagnosis not present

## 2015-11-04 DIAGNOSIS — M5030 Other cervical disc degeneration, unspecified cervical region: Secondary | ICD-10-CM | POA: Diagnosis not present

## 2015-11-04 DIAGNOSIS — M542 Cervicalgia: Secondary | ICD-10-CM | POA: Diagnosis not present

## 2015-11-04 DIAGNOSIS — M9902 Segmental and somatic dysfunction of thoracic region: Secondary | ICD-10-CM | POA: Diagnosis not present

## 2015-11-04 DIAGNOSIS — M545 Low back pain: Secondary | ICD-10-CM | POA: Diagnosis not present

## 2015-11-04 DIAGNOSIS — M5136 Other intervertebral disc degeneration, lumbar region: Secondary | ICD-10-CM | POA: Diagnosis not present

## 2015-11-04 DIAGNOSIS — M9901 Segmental and somatic dysfunction of cervical region: Secondary | ICD-10-CM | POA: Diagnosis not present

## 2015-11-04 DIAGNOSIS — M5134 Other intervertebral disc degeneration, thoracic region: Secondary | ICD-10-CM | POA: Diagnosis not present

## 2015-11-15 ENCOUNTER — Other Ambulatory Visit: Payer: Self-pay | Admitting: Internal Medicine

## 2015-11-22 DIAGNOSIS — M9903 Segmental and somatic dysfunction of lumbar region: Secondary | ICD-10-CM | POA: Diagnosis not present

## 2015-11-22 DIAGNOSIS — M5030 Other cervical disc degeneration, unspecified cervical region: Secondary | ICD-10-CM | POA: Diagnosis not present

## 2015-11-22 DIAGNOSIS — M542 Cervicalgia: Secondary | ICD-10-CM | POA: Diagnosis not present

## 2015-11-22 DIAGNOSIS — M9901 Segmental and somatic dysfunction of cervical region: Secondary | ICD-10-CM | POA: Diagnosis not present

## 2015-11-22 DIAGNOSIS — M5136 Other intervertebral disc degeneration, lumbar region: Secondary | ICD-10-CM | POA: Diagnosis not present

## 2015-11-22 DIAGNOSIS — M545 Low back pain: Secondary | ICD-10-CM | POA: Diagnosis not present

## 2015-11-22 DIAGNOSIS — M546 Pain in thoracic spine: Secondary | ICD-10-CM | POA: Diagnosis not present

## 2015-11-22 DIAGNOSIS — M9902 Segmental and somatic dysfunction of thoracic region: Secondary | ICD-10-CM | POA: Diagnosis not present

## 2015-11-22 DIAGNOSIS — M5134 Other intervertebral disc degeneration, thoracic region: Secondary | ICD-10-CM | POA: Diagnosis not present

## 2015-11-30 ENCOUNTER — Other Ambulatory Visit: Payer: Self-pay | Admitting: Internal Medicine

## 2015-12-23 DIAGNOSIS — H40033 Anatomical narrow angle, bilateral: Secondary | ICD-10-CM | POA: Diagnosis not present

## 2015-12-23 DIAGNOSIS — H43393 Other vitreous opacities, bilateral: Secondary | ICD-10-CM | POA: Diagnosis not present

## 2015-12-23 DIAGNOSIS — H25813 Combined forms of age-related cataract, bilateral: Secondary | ICD-10-CM | POA: Diagnosis not present

## 2015-12-28 ENCOUNTER — Other Ambulatory Visit: Payer: Self-pay | Admitting: Internal Medicine

## 2016-01-06 DIAGNOSIS — H40033 Anatomical narrow angle, bilateral: Secondary | ICD-10-CM | POA: Diagnosis not present

## 2016-01-06 DIAGNOSIS — H25813 Combined forms of age-related cataract, bilateral: Secondary | ICD-10-CM | POA: Diagnosis not present

## 2016-02-03 DIAGNOSIS — H25812 Combined forms of age-related cataract, left eye: Secondary | ICD-10-CM | POA: Diagnosis not present

## 2016-02-03 DIAGNOSIS — H2512 Age-related nuclear cataract, left eye: Secondary | ICD-10-CM | POA: Diagnosis not present

## 2016-02-24 DIAGNOSIS — H25811 Combined forms of age-related cataract, right eye: Secondary | ICD-10-CM | POA: Diagnosis not present

## 2016-02-24 DIAGNOSIS — Z961 Presence of intraocular lens: Secondary | ICD-10-CM | POA: Diagnosis not present

## 2016-02-24 DIAGNOSIS — H2511 Age-related nuclear cataract, right eye: Secondary | ICD-10-CM | POA: Diagnosis not present

## 2016-02-24 DIAGNOSIS — H2512 Age-related nuclear cataract, left eye: Secondary | ICD-10-CM | POA: Diagnosis not present

## 2016-03-02 DIAGNOSIS — M5134 Other intervertebral disc degeneration, thoracic region: Secondary | ICD-10-CM | POA: Diagnosis not present

## 2016-03-02 DIAGNOSIS — M5136 Other intervertebral disc degeneration, lumbar region: Secondary | ICD-10-CM | POA: Diagnosis not present

## 2016-03-02 DIAGNOSIS — M542 Cervicalgia: Secondary | ICD-10-CM | POA: Diagnosis not present

## 2016-03-02 DIAGNOSIS — M546 Pain in thoracic spine: Secondary | ICD-10-CM | POA: Diagnosis not present

## 2016-03-02 DIAGNOSIS — M5030 Other cervical disc degeneration, unspecified cervical region: Secondary | ICD-10-CM | POA: Diagnosis not present

## 2016-03-02 DIAGNOSIS — M545 Low back pain: Secondary | ICD-10-CM | POA: Diagnosis not present

## 2016-03-02 DIAGNOSIS — M9902 Segmental and somatic dysfunction of thoracic region: Secondary | ICD-10-CM | POA: Diagnosis not present

## 2016-03-02 DIAGNOSIS — M9903 Segmental and somatic dysfunction of lumbar region: Secondary | ICD-10-CM | POA: Diagnosis not present

## 2016-03-02 DIAGNOSIS — M9901 Segmental and somatic dysfunction of cervical region: Secondary | ICD-10-CM | POA: Diagnosis not present

## 2016-03-09 DIAGNOSIS — M5136 Other intervertebral disc degeneration, lumbar region: Secondary | ICD-10-CM | POA: Diagnosis not present

## 2016-03-09 DIAGNOSIS — M545 Low back pain: Secondary | ICD-10-CM | POA: Diagnosis not present

## 2016-03-09 DIAGNOSIS — M9901 Segmental and somatic dysfunction of cervical region: Secondary | ICD-10-CM | POA: Diagnosis not present

## 2016-03-09 DIAGNOSIS — M5030 Other cervical disc degeneration, unspecified cervical region: Secondary | ICD-10-CM | POA: Diagnosis not present

## 2016-03-09 DIAGNOSIS — M542 Cervicalgia: Secondary | ICD-10-CM | POA: Diagnosis not present

## 2016-03-09 DIAGNOSIS — M9903 Segmental and somatic dysfunction of lumbar region: Secondary | ICD-10-CM | POA: Diagnosis not present

## 2016-03-09 DIAGNOSIS — M546 Pain in thoracic spine: Secondary | ICD-10-CM | POA: Diagnosis not present

## 2016-03-09 DIAGNOSIS — M5134 Other intervertebral disc degeneration, thoracic region: Secondary | ICD-10-CM | POA: Diagnosis not present

## 2016-03-09 DIAGNOSIS — M9902 Segmental and somatic dysfunction of thoracic region: Secondary | ICD-10-CM | POA: Diagnosis not present

## 2016-03-16 DIAGNOSIS — M5134 Other intervertebral disc degeneration, thoracic region: Secondary | ICD-10-CM | POA: Diagnosis not present

## 2016-03-16 DIAGNOSIS — M9901 Segmental and somatic dysfunction of cervical region: Secondary | ICD-10-CM | POA: Diagnosis not present

## 2016-03-16 DIAGNOSIS — M542 Cervicalgia: Secondary | ICD-10-CM | POA: Diagnosis not present

## 2016-03-16 DIAGNOSIS — M546 Pain in thoracic spine: Secondary | ICD-10-CM | POA: Diagnosis not present

## 2016-03-16 DIAGNOSIS — M5030 Other cervical disc degeneration, unspecified cervical region: Secondary | ICD-10-CM | POA: Diagnosis not present

## 2016-03-16 DIAGNOSIS — M9902 Segmental and somatic dysfunction of thoracic region: Secondary | ICD-10-CM | POA: Diagnosis not present

## 2016-03-16 DIAGNOSIS — M9903 Segmental and somatic dysfunction of lumbar region: Secondary | ICD-10-CM | POA: Diagnosis not present

## 2016-03-16 DIAGNOSIS — M5136 Other intervertebral disc degeneration, lumbar region: Secondary | ICD-10-CM | POA: Diagnosis not present

## 2016-03-16 DIAGNOSIS — M545 Low back pain: Secondary | ICD-10-CM | POA: Diagnosis not present

## 2016-03-23 ENCOUNTER — Other Ambulatory Visit: Payer: Self-pay | Admitting: Internal Medicine

## 2016-03-23 DIAGNOSIS — M9902 Segmental and somatic dysfunction of thoracic region: Secondary | ICD-10-CM | POA: Diagnosis not present

## 2016-03-23 DIAGNOSIS — M5134 Other intervertebral disc degeneration, thoracic region: Secondary | ICD-10-CM | POA: Diagnosis not present

## 2016-03-23 DIAGNOSIS — M546 Pain in thoracic spine: Secondary | ICD-10-CM | POA: Diagnosis not present

## 2016-03-23 DIAGNOSIS — M545 Low back pain: Secondary | ICD-10-CM | POA: Diagnosis not present

## 2016-03-23 DIAGNOSIS — M5136 Other intervertebral disc degeneration, lumbar region: Secondary | ICD-10-CM | POA: Diagnosis not present

## 2016-03-23 DIAGNOSIS — M9903 Segmental and somatic dysfunction of lumbar region: Secondary | ICD-10-CM | POA: Diagnosis not present

## 2016-03-23 DIAGNOSIS — M5030 Other cervical disc degeneration, unspecified cervical region: Secondary | ICD-10-CM | POA: Diagnosis not present

## 2016-03-23 DIAGNOSIS — M9901 Segmental and somatic dysfunction of cervical region: Secondary | ICD-10-CM | POA: Diagnosis not present

## 2016-03-23 DIAGNOSIS — M542 Cervicalgia: Secondary | ICD-10-CM | POA: Diagnosis not present

## 2016-03-27 ENCOUNTER — Other Ambulatory Visit: Payer: Self-pay | Admitting: Internal Medicine

## 2016-03-27 NOTE — Telephone Encounter (Signed)
Last OV 8/16 ok to fill Celexa?

## 2016-03-27 NOTE — Telephone Encounter (Signed)
.    OFFICE VISIT NEEDED prior to any more refills.  Last OV august 2016

## 2016-03-30 DIAGNOSIS — M542 Cervicalgia: Secondary | ICD-10-CM | POA: Diagnosis not present

## 2016-03-30 DIAGNOSIS — M9903 Segmental and somatic dysfunction of lumbar region: Secondary | ICD-10-CM | POA: Diagnosis not present

## 2016-03-30 DIAGNOSIS — M9901 Segmental and somatic dysfunction of cervical region: Secondary | ICD-10-CM | POA: Diagnosis not present

## 2016-03-30 DIAGNOSIS — M9902 Segmental and somatic dysfunction of thoracic region: Secondary | ICD-10-CM | POA: Diagnosis not present

## 2016-03-30 DIAGNOSIS — M5030 Other cervical disc degeneration, unspecified cervical region: Secondary | ICD-10-CM | POA: Diagnosis not present

## 2016-03-30 DIAGNOSIS — M5136 Other intervertebral disc degeneration, lumbar region: Secondary | ICD-10-CM | POA: Diagnosis not present

## 2016-03-30 DIAGNOSIS — M545 Low back pain: Secondary | ICD-10-CM | POA: Diagnosis not present

## 2016-03-30 DIAGNOSIS — M546 Pain in thoracic spine: Secondary | ICD-10-CM | POA: Diagnosis not present

## 2016-03-30 DIAGNOSIS — M5134 Other intervertebral disc degeneration, thoracic region: Secondary | ICD-10-CM | POA: Diagnosis not present

## 2016-04-06 DIAGNOSIS — M9902 Segmental and somatic dysfunction of thoracic region: Secondary | ICD-10-CM | POA: Diagnosis not present

## 2016-04-06 DIAGNOSIS — M5030 Other cervical disc degeneration, unspecified cervical region: Secondary | ICD-10-CM | POA: Diagnosis not present

## 2016-04-06 DIAGNOSIS — M9903 Segmental and somatic dysfunction of lumbar region: Secondary | ICD-10-CM | POA: Diagnosis not present

## 2016-04-06 DIAGNOSIS — M542 Cervicalgia: Secondary | ICD-10-CM | POA: Diagnosis not present

## 2016-04-06 DIAGNOSIS — M546 Pain in thoracic spine: Secondary | ICD-10-CM | POA: Diagnosis not present

## 2016-04-06 DIAGNOSIS — M9901 Segmental and somatic dysfunction of cervical region: Secondary | ICD-10-CM | POA: Diagnosis not present

## 2016-04-06 DIAGNOSIS — M5136 Other intervertebral disc degeneration, lumbar region: Secondary | ICD-10-CM | POA: Diagnosis not present

## 2016-04-06 DIAGNOSIS — M5134 Other intervertebral disc degeneration, thoracic region: Secondary | ICD-10-CM | POA: Diagnosis not present

## 2016-04-06 DIAGNOSIS — M545 Low back pain: Secondary | ICD-10-CM | POA: Diagnosis not present

## 2016-04-13 DIAGNOSIS — M5030 Other cervical disc degeneration, unspecified cervical region: Secondary | ICD-10-CM | POA: Diagnosis not present

## 2016-04-13 DIAGNOSIS — M5136 Other intervertebral disc degeneration, lumbar region: Secondary | ICD-10-CM | POA: Diagnosis not present

## 2016-04-13 DIAGNOSIS — M542 Cervicalgia: Secondary | ICD-10-CM | POA: Diagnosis not present

## 2016-04-13 DIAGNOSIS — M546 Pain in thoracic spine: Secondary | ICD-10-CM | POA: Diagnosis not present

## 2016-04-13 DIAGNOSIS — M9902 Segmental and somatic dysfunction of thoracic region: Secondary | ICD-10-CM | POA: Diagnosis not present

## 2016-04-13 DIAGNOSIS — M9903 Segmental and somatic dysfunction of lumbar region: Secondary | ICD-10-CM | POA: Diagnosis not present

## 2016-04-13 DIAGNOSIS — M545 Low back pain: Secondary | ICD-10-CM | POA: Diagnosis not present

## 2016-04-13 DIAGNOSIS — M9901 Segmental and somatic dysfunction of cervical region: Secondary | ICD-10-CM | POA: Diagnosis not present

## 2016-04-13 DIAGNOSIS — M5134 Other intervertebral disc degeneration, thoracic region: Secondary | ICD-10-CM | POA: Diagnosis not present

## 2016-04-17 DIAGNOSIS — M546 Pain in thoracic spine: Secondary | ICD-10-CM | POA: Diagnosis not present

## 2016-04-17 DIAGNOSIS — M5136 Other intervertebral disc degeneration, lumbar region: Secondary | ICD-10-CM | POA: Diagnosis not present

## 2016-04-17 DIAGNOSIS — M9901 Segmental and somatic dysfunction of cervical region: Secondary | ICD-10-CM | POA: Diagnosis not present

## 2016-04-17 DIAGNOSIS — M5030 Other cervical disc degeneration, unspecified cervical region: Secondary | ICD-10-CM | POA: Diagnosis not present

## 2016-04-17 DIAGNOSIS — M9903 Segmental and somatic dysfunction of lumbar region: Secondary | ICD-10-CM | POA: Diagnosis not present

## 2016-04-17 DIAGNOSIS — M5134 Other intervertebral disc degeneration, thoracic region: Secondary | ICD-10-CM | POA: Diagnosis not present

## 2016-04-17 DIAGNOSIS — M545 Low back pain: Secondary | ICD-10-CM | POA: Diagnosis not present

## 2016-04-17 DIAGNOSIS — M9902 Segmental and somatic dysfunction of thoracic region: Secondary | ICD-10-CM | POA: Diagnosis not present

## 2016-04-17 DIAGNOSIS — M542 Cervicalgia: Secondary | ICD-10-CM | POA: Diagnosis not present

## 2016-04-24 DIAGNOSIS — M9901 Segmental and somatic dysfunction of cervical region: Secondary | ICD-10-CM | POA: Diagnosis not present

## 2016-04-24 DIAGNOSIS — M545 Low back pain: Secondary | ICD-10-CM | POA: Diagnosis not present

## 2016-04-24 DIAGNOSIS — M5136 Other intervertebral disc degeneration, lumbar region: Secondary | ICD-10-CM | POA: Diagnosis not present

## 2016-04-24 DIAGNOSIS — M542 Cervicalgia: Secondary | ICD-10-CM | POA: Diagnosis not present

## 2016-04-24 DIAGNOSIS — M5134 Other intervertebral disc degeneration, thoracic region: Secondary | ICD-10-CM | POA: Diagnosis not present

## 2016-04-24 DIAGNOSIS — M546 Pain in thoracic spine: Secondary | ICD-10-CM | POA: Diagnosis not present

## 2016-04-24 DIAGNOSIS — M9903 Segmental and somatic dysfunction of lumbar region: Secondary | ICD-10-CM | POA: Diagnosis not present

## 2016-04-24 DIAGNOSIS — M5030 Other cervical disc degeneration, unspecified cervical region: Secondary | ICD-10-CM | POA: Diagnosis not present

## 2016-04-24 DIAGNOSIS — M9902 Segmental and somatic dysfunction of thoracic region: Secondary | ICD-10-CM | POA: Diagnosis not present

## 2016-04-27 ENCOUNTER — Other Ambulatory Visit: Payer: Self-pay | Admitting: Internal Medicine

## 2016-04-27 ENCOUNTER — Telehealth: Payer: Self-pay | Admitting: *Deleted

## 2016-04-27 MED ORDER — AMLODIPINE BESYLATE 2.5 MG PO TABS: 2.5000 mg | ORAL_TABLET | Freq: Every day | ORAL | Status: DC

## 2016-04-27 NOTE — Addendum Note (Signed)
Addended by: Carmin Muskrat on: 04/27/2016 03:53 PM   Modules accepted: Orders

## 2016-04-27 NOTE — Telephone Encounter (Signed)
Refill faxed to [phone] as requested,

## 2016-04-27 NOTE — Telephone Encounter (Signed)
Would this be something you'd be okay with sending?

## 2016-04-27 NOTE — Telephone Encounter (Signed)
Patient is currently in [place] forgot her amlodipine. She requested about four pills called into a nearby pharmacy until she returns home. Please notify patient if this can be filled.  Pharmacy [facility] [phone]

## 2016-04-27 NOTE — Telephone Encounter (Addendum)
Per provider to refill with 7 tablets.

## 2016-04-27 NOTE — Telephone Encounter (Signed)
YES, PLEASE CALL IN [facility] LOCAL PH[facility]Y IN [place]

## 2016-04-27 NOTE — Telephone Encounter (Signed)
i have printed RX and will have PCP sign and fax to pharmacy.  Fax # [phone]

## 2016-05-04 DIAGNOSIS — M542 Cervicalgia: Secondary | ICD-10-CM | POA: Diagnosis not present

## 2016-05-04 DIAGNOSIS — M5136 Other intervertebral disc degeneration, lumbar region: Secondary | ICD-10-CM | POA: Diagnosis not present

## 2016-05-04 DIAGNOSIS — M9902 Segmental and somatic dysfunction of thoracic region: Secondary | ICD-10-CM | POA: Diagnosis not present

## 2016-05-04 DIAGNOSIS — M546 Pain in thoracic spine: Secondary | ICD-10-CM | POA: Diagnosis not present

## 2016-05-04 DIAGNOSIS — M9901 Segmental and somatic dysfunction of cervical region: Secondary | ICD-10-CM | POA: Diagnosis not present

## 2016-05-04 DIAGNOSIS — M5134 Other intervertebral disc degeneration, thoracic region: Secondary | ICD-10-CM | POA: Diagnosis not present

## 2016-05-04 DIAGNOSIS — M545 Low back pain: Secondary | ICD-10-CM | POA: Diagnosis not present

## 2016-05-04 DIAGNOSIS — M5030 Other cervical disc degeneration, unspecified cervical region: Secondary | ICD-10-CM | POA: Diagnosis not present

## 2016-05-04 DIAGNOSIS — M9903 Segmental and somatic dysfunction of lumbar region: Secondary | ICD-10-CM | POA: Diagnosis not present

## 2016-05-20 ENCOUNTER — Encounter: Payer: Self-pay | Admitting: Internal Medicine

## 2016-05-20 ENCOUNTER — Ambulatory Visit (INDEPENDENT_AMBULATORY_CARE_PROVIDER_SITE_OTHER): Payer: Medicare Other | Admitting: Internal Medicine

## 2016-05-20 ENCOUNTER — Telehealth: Payer: Self-pay | Admitting: Internal Medicine

## 2016-05-20 VITALS — BP 120/74 | HR 67 | Temp 98.2°F | Ht 67.0 in | Wt 221.5 lb

## 2016-05-20 DIAGNOSIS — R5383 Other fatigue: Secondary | ICD-10-CM

## 2016-05-20 DIAGNOSIS — Z0001 Encounter for general adult medical examination with abnormal findings: Secondary | ICD-10-CM

## 2016-05-20 DIAGNOSIS — K529 Noninfective gastroenteritis and colitis, unspecified: Secondary | ICD-10-CM | POA: Diagnosis not present

## 2016-05-20 DIAGNOSIS — T3695XA Adverse effect of unspecified systemic antibiotic, initial encounter: Secondary | ICD-10-CM

## 2016-05-20 DIAGNOSIS — E559 Vitamin D deficiency, unspecified: Secondary | ICD-10-CM | POA: Diagnosis not present

## 2016-05-20 DIAGNOSIS — Z1159 Encounter for screening for other viral diseases: Secondary | ICD-10-CM | POA: Diagnosis not present

## 2016-05-20 DIAGNOSIS — E538 Deficiency of other specified B group vitamins: Secondary | ICD-10-CM

## 2016-05-20 DIAGNOSIS — E785 Hyperlipidemia, unspecified: Secondary | ICD-10-CM

## 2016-05-20 DIAGNOSIS — Z0000 Encounter for general adult medical examination without abnormal findings: Secondary | ICD-10-CM

## 2016-05-20 DIAGNOSIS — E890 Postprocedural hypothyroidism: Secondary | ICD-10-CM | POA: Diagnosis not present

## 2016-05-20 DIAGNOSIS — K521 Toxic gastroenteritis and colitis: Secondary | ICD-10-CM

## 2016-05-20 DIAGNOSIS — I10 Essential (primary) hypertension: Secondary | ICD-10-CM

## 2016-05-20 DIAGNOSIS — Z1239 Encounter for other screening for malignant neoplasm of breast: Secondary | ICD-10-CM

## 2016-05-20 MED ORDER — TETANUS-DIPHTH-ACELL PERTUSSIS 5-2.5-18.5 LF-MCG/0.5 IM SUSP: 0.5000 mL | Freq: Once | INTRAMUSCULAR | Status: DC

## 2016-05-20 NOTE — Progress Notes (Addendum)
Patient ID: Julie Perez, female    DOB: 21-Jul-1946  Age: 70 y.o. MRN: [id number]  The patient is here for annual Medicare wellness examination and management of other chronic and acute problems.  colonoscopy 2012  [place] normal  Bilateral cataracts emoved Dec Feb [facility]  mamogram  dec 2015 needs a monday appt  Ate lunch at 12:30  [place] toast cold.    The risk factors are reflected in the social history.  The roster of all physicians providing medical care to patient - is listed in the Snapshot section of the chart.  Activities of daily living:  The patient is 100% independent in all ADLs: dressing, toileting, feeding as well as independent mobility  Home safety : The patient has smoke detectors in the home. They wear seatbelts.  There are no firearms at home. There is no violence in the home.   There is no risks for hepatitis, STDs or HIV. There is no   history of blood transfusion. They have no travel history to infectious disease endemic areas of the world.  The patient has seen their dentist in the last six month. They have seen their eye doctor in the last year. They admit to slight hearing difficulty with regard to whispered voices and some television programs.  They have deferred audiologic testing in the last year.  They do not  have excessive sun exposure. Discussed the need for sun protection: hats, long sleeves and use of sunscreen if there is significant sun exposure.   Diet: the importance of a healthy diet is discussed. They do have a healthy diet.  The benefits of regular aerobic exercise were discussed. She walks 4 times per week ,  20 minutes.   Depression screen: there are no signs or vegative symptoms of depression- irritability, change in appetite, anhedonia, sadness/tearfullness.  Cognitive assessment: the patient manages all their financial and personal affairs and is actively engaged. They could relate day,date,year and events; recalled 2/3 objects at 3  minutes; performed clock-face test normally.  The following portions of the patient's history were reviewed and updated as appropriate: allergies, current medications, past family history, past medical history,  past surgical history, past social history  and problem list.  Visual acuity was not assessed per patient preference since she has regular follohpriate screening and preventive services including : fall prevention , diabetes screening, nutrition counseling, colorectal cancer screening, and recommended immunizations.    CC: The primary encounter diagnosis was B12 deficiency. Diagnoses of Vitamin D deficiency, Need for hepatitis C screening test, Other fatigue, Hyperlipidemia, Breast cancer screening, Encounter for Medicare annual wellness exam, Antibiotic-associated diarrhea, Essential hypertension, and Hypothyroidism, postradioiodine therapy were also pertinent to this visit.  Fatigue:  Sleeping well,   Has been receiving amox for abscessed tooth,  Having some diarrhea    History Keuna has a past medical history of Frequent headaches; Hyperlipidemia; Thyroid disease; and Migraines.   She has past surgical history that includes Abdominal hysterectomy (1979).   Her family history includes Aneurysm in her sister; Cancer (age of onset: 38) in her father; Cancer (age of onset: 5) in her sister; Diabetes in her brother and sister; Heart disease in her sister; Hypertension in her mother.She reports that she has never smoked. She has never used smokeless tobacco. She reports that she drinks about 4.2 oz of alcohol per week. She reports that she does not use illicit drugs.  Outpatient Prescriptions Prior to Visit  Medication Sig Dispense Refill  . citalopram (CELEXA) 40  MG tablet TAKE 1 TABLET DAILY 90 tablet 0  . amLODipine (NORVASC) 2.5 MG tablet Take 1 tablet (2.5 mg total) by mouth at bedtime. 7 tablet 0  . atorvastatin (LIPITOR) 40 MG tablet TAKE 1 TABLET DAILY AT 6 P.M. 90 tablet 0  .  levothyroxine (SYNTHROID, LEVOTHROID) 112 MCG tablet TAKE ONE TABLET BY MOUTH ONCE DAILY BEFORE BREAKFAST. 90 tablet 2  . Cholecalciferol (VITAMIN D3) 1000 UNITS CAPS Take 1 capsule by mouth daily. Reported on 05/20/2016    . Cyanocobalamin (VITAMIN B-12) 2500 MCG SUBL Place 1 tablet under the tongue daily. Reported on 05/20/2016    . omeprazole (PRILOSEC) 40 MG capsule Take 1 capsule (40 mg total) by mouth daily. (Patient not taking: Reported on 05/20/2016) 30 capsule 3  . ALPRAZolam (XANAX) 0.25 MG tablet Take 1 tablet (0.25 mg total) by mouth at bedtime as needed for sleep. (Patient not taking: Reported on 05/20/2016) 30 tablet 2  . benzonatate (TESSALON) 200 MG capsule Take 1 capsule (200 mg total) by mouth 3 (three) times daily as needed for cough. 60 capsule 2  . ciprofloxacin (CIPRO) 500 MG tablet Take 1 tablet (500 mg total) by mouth 2 (two) times daily. 14 tablet 0  . Magnesium 250 MG TABS Take 250 mg by mouth daily.    . Omega-3 Fatty Acids (FISH OIL) 1200 MG CPDR Take 1 capsule by mouth 3 (three) times daily.     No facility-administered medications prior to visit.    Review of Systems   Patient denies headache, fevers, malaise, unintentional weight loss, skin rash, eye pain, sinus congestion and sinus pain, sore throat, dysphagia,  hemoptysis , cough, dyspnea, wheezing, chest pain, palpitations, orthopnea, edema, abdominal pain, nausea, melena,, constipation, flank pain, dysuria, hematuria, urinary  Frequency, nocturia, numbness, tingling, seizures,  Focal weakness, Loss of consciousness,  Tremor, insomnia, depression, anxiety, and suicidal ideation.      Objective:  BP 120/74 mmHg  Pulse 67  Temp(Src) 98.2 F (36.8 C)  Ht 5\' 7"  (1.702 m)  Wt 221 lb 8 oz (100.472 kg)  BMI 34.68 kg/m2  SpO2 96%  Physical Exam   General appearance: alert, cooperative and appears stated age Head: Normocephalic, without obvious abnormality, atraumatic Eyes: conjunctivae/corneas clear. PERRL,  EOM's intact. Fundi benign. Ears: normal TM's and external ear canals both ears Nose: Nares normal. Septum midline. Mucosa normal. No drainage or sinus tenderness. Throat: lips, mucosa, and tongue normal; teeth and gums normal Neck: no adenopathy, no carotid bruit, no JVD, supple, symmetrical, trachea midline and thyroid not enlarged, symmetric, no tenderness/mass/nodules Lungs: clear to auscultation bilaterally Breasts: normal appearance, no masses or tenderness Heart: regular rate and rhythm, S1, S2 normal, no murmur, click, rub or gallop Abdomen: soft, non-tender; bowel sounds normal; no masses,  no organomegaly Extremities: extremities normal, atraumatic, no cyanosis or edema Pulses: 2+ and symmetric Skin: Skin color, texture, turgor normal. No rashes or lesions Neurologic: Alert and oriented X 3, normal strength and tone. Normal symmetric reflexes. Normal coordination and gait.     Assessment & Plan:   Problem List Items Addressed This Visit    Vitamin D deficiency    Recurrent.  4 weeks of Drisdol prescribed.       Relevant Orders   VITAMIN D 25 Hydroxy (Vit-D Deficiency, Fractures) (Completed)   Hypothyroidism, postradioiodine therapy    Thyroid function is more active on current dose of levothyroxine 159mcg.   Lab Results  Component Value Date   TSH 3.60 05/20/2016  Encounter for Medicare annual wellness exam    Annual Medicare wellness  exam was done as well as a comprehensive physical exam and management of acute and chronic conditions .  During the course of the visit the patient was educated and counseled about appropriate screening and preventive services including : fall prevention , diabetes screening, nutrition counseling, colorectal cancer screening, and recommended immunizations.  Printed recommendations for health maintenance screenings was given.       Essential hypertension    Well controlled on current regimen. Renal function stable, no  changes today.  Lab Results  Component Value Date   CREATININE 0.74 05/20/2016   Lab Results  Component Value Date   NA 140 05/20/2016   K 4.0 05/20/2016   CL 105 05/20/2016   CO2 28 05/20/2016         Antibiotic-associated diarrhea    Intermittent.  Advised to start taking a priobiotic      B12 deficiency - Primary   Relevant Orders   B12 (Completed)   Fatigue   Relevant Orders   Comprehensive metabolic panel (Completed)   Hepatitis C antibody (Completed)   TSH (Completed)   CBC with Differential/Platelet (Completed)    Other Visit Diagnoses    Need for hepatitis C screening test        Hyperlipidemia        Relevant Orders    Lipid panel (Completed)    LDL cholesterol, direct (Completed)    Breast cancer screening        Relevant Orders    MM DIGITAL SCREENING BILATERAL       I have discontinued Ms. Whitelock's ALPRAZolam, Magnesium, benzonatate, Fish Oil, and ciprofloxacin. I am also having her start on Tdap. Additionally, I am having her maintain her Vitamin D3, Vitamin B-12, omeprazole, citalopram, and amoxicillin.  Meds ordered this encounter  Medications  . amoxicillin (AMOXIL) 500 MG capsule    Sig: Take 1 capsule by mouth 3 (three) times daily.    Refill:  0  . Tdap (BOOSTRIX) 5-2.5-18.5 LF-MCG/0.5 injection    Sig: Inject 0.5 mLs into the muscle once.    Dispense:  0.5 mL    Refill:  0    Medications Discontinued During This Encounter  Medication Reason  . ALPRAZolam (XANAX) 0.25 MG tablet Patient Preference  . benzonatate (TESSALON) 200 MG capsule Completed Course  . ciprofloxacin (CIPRO) 500 MG tablet Completed Course  . Magnesium 250 MG TABS Error  . Omega-3 Fatty Acids (FISH OIL) 1200 MG CPDR Error    Follow-up: Return in about 6 months (around 11/19/2016).   Crecencio Mc, MD

## 2016-05-20 NOTE — Patient Instructions (Addendum)
Please take a probiotic ( Align, Floraque or Culturelle), the generic version of one of these over the counter medications, or a PROBIOTIC BEVERAGE  (kombucha,  [place] ) Yogurt, or another dietary source) for a minimum of 3 weeks to prevent a serious antibiotic associated diarrhea  Called clostridium dificile colitis.  Taking a probiotic may also prevent vaginitis due to yeast infections and can be continued indefinitely if you feel that it improves your digestion or your elimination (bowels).   I want you to try to lose 22 lbs over the next 6 months using a low glycemic index/Mediterranean style diet!   To make a low carb chip :  Take the Joseph's Lavash or Pita bread,  Or the [place] Low carb whole wheat tortilla   Place on metal cookie sheet  Brush with olive oil  Sprinkle garlic powder (NOT garlic salt), grated parmesan cheese, mediterranean seasoning , or all of them?  Bake at 275 for 30 minutes   We have substitutions for your potatoes!!  Try the mashed cauliflower and riced cauliflower dishes instead of rice and mashed potatoes  Mashed turnips are also very low carb!   For desserts :  Try the Dannon Lt n Fit greek yogurt dessert flavors and top with reddi Whip .  8 carbs,  80 calories  Try Oikos Triple Zero [place] Yogurt in the salted caramel, and the coffee flavors  With Whipped Cream for dessert  breyer's low carb ice cream, available in bars (on a stick, better ) or scoopable ice cream  HERE ARE THE LOW CARB  BREAD CHOICES       ,Menopause is a normal process in which your reproductive ability comes to an end. This process happens gradually over a span of months to years, usually between the ages of 58 and 95. Menopause is complete when you have missed 12 consecutive menstrual periods. It is important to talk with your health care provider about some of the most common conditions that affect  postmenopausal women, such as heart disease, cancer, and bone loss (osteoporosis). Adopting a healthy lifestyle and getting preventive care can help to promote your health and wellness. Those actions can also lower your chances of developing some of these common conditions. WHAT SHOULD I KNOW ABOUT MENOPAUSE? During menopause, you may experience a number of symptoms, such as:  Moderate-to-severe hot flashes.  Night sweats.  Decrease in sex drive.  Mood swings.  Headaches.  Tiredness.  Irritability.  Memory problems.  Insomnia. Choosing to treat or not to treat menopausal changes is an individual decision that you make with your health care provider. WHAT SHOULD I KNOW ABOUT HORMONE REPLACEMENT THERAPY AND SUPPLEMENTS? Hormone therapy products are effective for treating symptoms that are associated with menopause, such as hot flashes and night sweats. Hormone replacement carries certain risks, especially as you become older. If you are thinking about using estrogen or estrogen with progestin treatments, discuss the benefits and risks with your health care provider. WHAT SHOULD I KNOW ABOUT HEART DISEASE AND STROKE? Heart disease, heart attack, and stroke become more likely as you age. This may be due, in part, to the hormonal changes that your body experiences during menopause. These can affect how your body processes dietary fats, triglycerides, and cholesterol. Heart attack and stroke are both medical emergencies. There are many things that you can do to help prevent heart disease and stroke:  Have your blood pressure checked at  least every 1-2 years. High blood pressure causes heart disease and increases the risk of stroke.  If you are 34-1 years old, ask your health care provider if you should take aspirin to prevent a heart attack or a stroke.  Do not use any tobacco products, including cigarettes, chewing tobacco, or electronic cigarettes. If you need help quitting, ask your  health care provider.  It is important to eat a healthy diet and maintain a healthy weight.  Be sure to include plenty of vegetables, fruits, low-fat dairy products, and lean protein.  Avoid eating foods that are high in solid fats, added sugars, or salt (sodium).  Get regular exercise. This is one of the most important things that you can do for your health.  Try to exercise for at least 150 minutes each week. The type of exercise that you do should increase your heart rate and make you sweat. This is known as moderate-intensity exercise.  Try to do strengthening exercises at least twice each week. Do these in addition to the moderate-intensity exercise.  Know your numbers.Ask your health care provider to check your cholesterol and your blood glucose. Continue to have your blood tested as directed by your health care provider. WHAT SHOULD I KNOW ABOUT CANCER SCREENING? There are several types of cancer. Take the following steps to reduce your risk and to catch any cancer development as early as possible. Breast Cancer  Practice breast self-awareness.  This means understanding how your breasts normally appear and feel.  It also means doing regular breast self-exams. Let your health care provider know about any changes, no matter how small.  If you are 77 or older, have a clinician do a breast exam (clinical breast exam or CBE) every year. Depending on your age, family history, and medical history, it may be recommended that you also have a yearly breast X-ray (mammogram).  If you have a family history of breast cancer, talk with your health care provider about genetic screening.  If you are at high risk for breast cancer, talk with your health care provider about having an MRI and a mammogram every year.  Breast cancer (BRCA) gene test is recommended for women who have family members with BRCA-related cancers. Results of the assessment will determine the need for genetic counseling  and BRCA1 and for BRCA2 testing. BRCA-related cancers include these types:  Breast. This occurs in males or females.  Ovarian.  Tubal. This may also be called fallopian tube cancer.  Cancer of the abdominal or pelvic lining (peritoneal cancer).  Prostate.  Pancreatic. Cervical, Uterine, and Ovarian Cancer Your health care provider may recommend that you be screened regularly for cancer of the pelvic organs. These include your ovaries, uterus, and vagina. This screening involves a pelvic exam, which includes checking for microscopic changes to the surface of your cervix (Pap test).  For women ages 21-65, health care providers may recommend a pelvic exam and a Pap test every three years. For women ages 11-65, they may recommend the Pap test and pelvic exam, combined with testing for human papilloma virus (HPV), every five years. Some types of HPV increase your risk of cervical cancer. Testing for HPV may also be done on women of any age who have unclear Pap test results.  Other health care providers may not recommend any screening for nonpregnant women who are considered low risk for pelvic cancer and have no symptoms. Ask your health care provider if a screening pelvic exam is right for you.  If you have had past treatment for cervical cancer or a condition that could lead to cancer, you need Pap tests and screening for cancer for at least 20 years after your treatment. If Pap tests have been discontinued for you, your risk factors (such as having a new sexual partner) need to be reassessed to determine if you should start having screenings again. Some women have medical problems that increase the chance of getting cervical cancer. In these cases, your health care provider may recommend that you have screening and Pap tests more often.  If you have a family history of uterine cancer or ovarian cancer, talk with your health care provider about genetic screening.  If you have vaginal bleeding  after reaching menopause, tell your health care provider.  There are currently no reliable tests available to screen for ovarian cancer. Lung Cancer Lung cancer screening is recommended for adults 68-84 years old who are at high risk for lung cancer because of a history of smoking. A yearly low-dose CT scan of the lungs is recommended if you:  Currently smoke.  Have a history of at least 30 pack-years of smoking and you currently smoke or have quit within the past 15 years. A pack-year is smoking an average of one pack of cigarettes per day for one year. Yearly screening should:  Continue until it has been 15 years since you quit.  Stop if you develop a health problem that would prevent you from having lung cancer treatment. Colorectal Cancer  This type of cancer can be detected and can often be prevented.  Routine colorectal cancer screening usually begins at age 46 and continues through age 67.  If you have risk factors for colon cancer, your health care provider may recommend that you be screened at an earlier age.  If you have a family history of colorectal cancer, talk with your health care provider about genetic screening.  Your health care provider may also recommend using home test kits to check for hidden blood in your stool.  A small camera at the end of a tube can be used to examine your colon directly (sigmoidoscopy or colonoscopy). This is done to check for the earliest forms of colorectal cancer.  Direct examination of the colon should be repeated every 5-10 years until age 63. However, if early forms of precancerous polyps or small growths are found or if you have a family history or genetic risk for colorectal cancer, you may need to be screened more often. Skin Cancer  Check your skin from head to toe regularly.  Monitor any moles. Be sure to tell your health care provider:  About any new moles or changes in moles, especially if there is a change in a mole's shape  or color.  If you have a mole that is larger than the size of a pencil eraser.  If any of your family members has a history of skin cancer, especially at a young age, talk with your health care provider about genetic screening.  Always use sunscreen. Apply sunscreen liberally and repeatedly throughout the day.  Whenever you are outside, protect yourself by wearing long sleeves, pants, a wide-brimmed hat, and sunglasses. WHAT SHOULD I KNOW ABOUT OSTEOPOROSIS? Osteoporosis is a condition in which bone destruction happens more quickly than new bone creation. After menopause, you may be at an increased risk for osteoporosis. To help prevent osteoporosis or the bone fractures that can happen because of osteoporosis, the following is recommended:  If you are  33-91 years old, get at least 1,000 mg of calcium and at least 600 mg of vitamin D per day.  If you are older than age 24 but younger than age 65, get at least 1,200 mg of calcium and at least 600 mg of vitamin D per day.  If you are older than age 61, get at least 1,200 mg of calcium and at least 800 mg of vitamin D per day. Smoking and excessive alcohol intake increase the risk of osteoporosis. Eat foods that are rich in calcium and vitamin D, and do weight-bearing exercises several times each week as directed by your health care provider. WHAT SHOULD I KNOW ABOUT HOW MENOPAUSE AFFECTS [place]? Depression may occur at any age, but it is more common as you become older. Common symptoms of depression include:  Low or sad mood.  Changes in sleep patterns.  Changes in appetite or eating patterns.  Feeling an overall lack of motivation or enjoyment of activities that you previously enjoyed.  Frequent crying spells. Talk with your health care provider if you think that you are experiencing depression. WHAT SHOULD I KNOW ABOUT IMMUNIZATIONS? It is important that you get and maintain your immunizations. These include:  Tetanus,  diphtheria, and pertussis (Tdap) booster vaccine.  Influenza every year before the flu season begins.  Pneumonia vaccine.  Shingles vaccine. Your health care provider may also recommend other immunizations.   This information is not intended to replace advice given to you by your health care provider. Make sure you discuss any questions you have with your health care provider.   Document Released: 01/01/2006 Document Revised: 11/30/2014 Document Reviewed: 07/12/2014 Elsevier Interactive Patient Education [employer].

## 2016-05-20 NOTE — Progress Notes (Signed)
Pre-visit discussion using our clinic review tool. No additional management support is needed unless otherwise documented below in the visit note.  

## 2016-05-20 NOTE — Telephone Encounter (Signed)
amLODipine (NORVASC) 2.5 MG tablet  atorvastatin (LIPITOR) 40 MG tablet  levothyroxine (SYNTHROID, LEVOTHROID) 112 MCG tablet

## 2016-05-21 LAB — CBC WITH DIFFERENTIAL/PLATELET
BASOS PCT: 0.3 % (ref 0.0–3.0)
Basophils Absolute: 0 10*3/uL (ref 0.0–0.1)
EOS ABS: 0.1 10*3/uL (ref 0.0–0.7)
EOS PCT: 1.8 % (ref 0.0–5.0)
HCT: 40.2 % (ref 36.0–46.0)
HEMOGLOBIN: 13.6 g/dL (ref 12.0–15.0)
LYMPHS ABS: 2.2 10*3/uL (ref 0.7–4.0)
Lymphocytes Relative: 29.3 % (ref 12.0–46.0)
MCHC: 33.9 g/dL (ref 30.0–36.0)
MCV: 88.4 fl (ref 78.0–100.0)
MONO ABS: 0.6 10*3/uL (ref 0.1–1.0)
Monocytes Relative: 7.3 % (ref 3.0–12.0)
NEUTROS ABS: 4.7 10*3/uL (ref 1.4–7.7)
Neutrophils Relative %: 61.3 % (ref 43.0–77.0)
PLATELETS: 234 10*3/uL (ref 150.0–400.0)
RBC: 4.55 Mil/uL (ref 3.87–5.11)
RDW: 14 % (ref 11.5–15.5)
WBC: 7.7 10*3/uL (ref 4.0–10.5)

## 2016-05-21 LAB — LIPID PANEL
CHOL/HDL RATIO: 4
CHOLESTEROL: 184 mg/dL (ref 0–200)
HDL: 52.4 mg/dL (ref >39.00)
NonHDL: 132.07
TRIGLYCERIDES: 238 mg/dL — AB (ref 0.0–149.0)
VLDL: 47.6 mg/dL — ABNORMAL HIGH (ref 0.0–40.0)

## 2016-05-21 LAB — COMPREHENSIVE METABOLIC PANEL
ALBUMIN: 4.3 g/dL (ref 3.5–5.2)
ALK PHOS: 61 U/L (ref 39–117)
ALT: 21 U/L (ref 0–35)
AST: 19 U/L (ref 0–37)
BILIRUBIN TOTAL: 0.7 mg/dL (ref 0.2–1.2)
BUN: 16 mg/dL (ref 6–23)
CALCIUM: 9.2 mg/dL (ref 8.4–10.5)
CO2: 28 meq/L (ref 19–32)
CREATININE: 0.74 mg/dL (ref 0.40–1.20)
Chloride: 105 mEq/L (ref 96–112)
GFR: 82.72 mL/min (ref >60.00)
Glucose, Bld: 90 mg/dL (ref 70–99)
Potassium: 4 mEq/L (ref 3.5–5.1)
Sodium: 140 mEq/L (ref 135–145)
Total Protein: 7.3 g/dL (ref 6.0–8.3)

## 2016-05-21 LAB — VITAMIN B12: Vitamin B-12: 311 pg/mL (ref 211–911)

## 2016-05-21 LAB — HEPATITIS C ANTIBODY: HCV Ab: NEGATIVE

## 2016-05-21 LAB — VITAMIN D 25 HYDROXY (VIT D DEFICIENCY, FRACTURES): VITD: 23.65 ng/mL — AB (ref 30.00–100.00)

## 2016-05-21 LAB — LDL CHOLESTEROL, DIRECT: LDL DIRECT: 113 mg/dL

## 2016-05-21 LAB — TSH: TSH: 3.6 u[IU]/mL (ref 0.35–4.50)

## 2016-05-21 NOTE — Telephone Encounter (Signed)
Will refill as soon as labs are back patient was notified in office.Let me know when I can refill.

## 2016-05-22 ENCOUNTER — Other Ambulatory Visit: Payer: Self-pay | Admitting: Internal Medicine

## 2016-05-22 DIAGNOSIS — T3695XA Adverse effect of unspecified systemic antibiotic, initial encounter: Secondary | ICD-10-CM

## 2016-05-22 DIAGNOSIS — K521 Toxic gastroenteritis and colitis: Secondary | ICD-10-CM | POA: Insufficient documentation

## 2016-05-22 MED ORDER — AMLODIPINE BESYLATE 2.5 MG PO TABS: 2.5000 mg | ORAL_TABLET | Freq: Every day | ORAL | Status: DC

## 2016-05-22 MED ORDER — ERGOCALCIFEROL 1.25 MG (50000 UT) PO CAPS: 50000.0000 [IU] | ORAL_CAPSULE | ORAL | Status: DC

## 2016-05-22 MED ORDER — LEVOTHYROXINE SODIUM 112 MCG PO TABS: ORAL_TABLET | ORAL | Status: DC

## 2016-05-22 MED ORDER — ATORVASTATIN CALCIUM 40 MG PO TABS
ORAL_TABLET | ORAL | Status: DC
Start: 2016-05-22 — End: 2016-11-27

## 2016-05-22 NOTE — Telephone Encounter (Signed)
Labs are all normal except Your vitamin D is low, which can increase your risk of weak bones and fractures and interfere with your body's ability to absorb the calcium in your diet.   I am calling in a megadose of Vit D to take once weekly for a total of 3 months,  Then after you finish the weekly supplement, you should start taking an OTC  Vit D3 supplement 1000 units daily.   i will refill all requested meds

## 2016-05-22 NOTE — Progress Notes (Signed)
Script printed recent electronically.

## 2016-05-22 NOTE — Telephone Encounter (Signed)
Patient notified and voiced understanding.

## 2016-05-22 NOTE — Assessment & Plan Note (Signed)

## 2016-05-22 NOTE — Assessment & Plan Note (Signed)
Well controlled on current regimen. Renal function stable, no changes today.  Lab Results  Component Value Date   CREATININE 0.74 05/20/2016   Lab Results  Component Value Date   NA 140 05/20/2016   K 4.0 05/20/2016   CL 105 05/20/2016   CO2 28 05/20/2016

## 2016-05-22 NOTE — Assessment & Plan Note (Signed)
Thyroid function is more active on current dose of levothyroxine 180mcg.   Lab Results  Component Value Date   TSH 3.60 05/20/2016

## 2016-05-22 NOTE — Assessment & Plan Note (Signed)
Intermittent.  Advised to start taking a priobiotic

## 2016-05-24 NOTE — Addendum Note (Signed)
Addended by: Crecencio Mc on: 05/24/2016 09:39 AM   Modules accepted: Miquel Dunn

## 2016-05-24 NOTE — Addendum Note (Signed)
Addended by: Crecencio Mc on: 05/24/2016 09:40 AM   Modules accepted: Miquel Dunn

## 2016-05-24 NOTE — Assessment & Plan Note (Signed)
Recurrent.  4 weeks of Drisdol prescribed.

## 2016-06-01 DIAGNOSIS — H40033 Anatomical narrow angle, bilateral: Secondary | ICD-10-CM | POA: Diagnosis not present

## 2016-06-08 DIAGNOSIS — M9903 Segmental and somatic dysfunction of lumbar region: Secondary | ICD-10-CM | POA: Diagnosis not present

## 2016-06-08 DIAGNOSIS — M5136 Other intervertebral disc degeneration, lumbar region: Secondary | ICD-10-CM | POA: Diagnosis not present

## 2016-06-08 DIAGNOSIS — M5030 Other cervical disc degeneration, unspecified cervical region: Secondary | ICD-10-CM | POA: Diagnosis not present

## 2016-06-08 DIAGNOSIS — M546 Pain in thoracic spine: Secondary | ICD-10-CM | POA: Diagnosis not present

## 2016-06-08 DIAGNOSIS — M542 Cervicalgia: Secondary | ICD-10-CM | POA: Diagnosis not present

## 2016-06-08 DIAGNOSIS — M5134 Other intervertebral disc degeneration, thoracic region: Secondary | ICD-10-CM | POA: Diagnosis not present

## 2016-06-08 DIAGNOSIS — M545 Low back pain: Secondary | ICD-10-CM | POA: Diagnosis not present

## 2016-06-08 DIAGNOSIS — M9901 Segmental and somatic dysfunction of cervical region: Secondary | ICD-10-CM | POA: Diagnosis not present

## 2016-06-08 DIAGNOSIS — M9902 Segmental and somatic dysfunction of thoracic region: Secondary | ICD-10-CM | POA: Diagnosis not present

## 2016-06-16 DIAGNOSIS — M5134 Other intervertebral disc degeneration, thoracic region: Secondary | ICD-10-CM | POA: Diagnosis not present

## 2016-06-16 DIAGNOSIS — M542 Cervicalgia: Secondary | ICD-10-CM | POA: Diagnosis not present

## 2016-06-16 DIAGNOSIS — M9902 Segmental and somatic dysfunction of thoracic region: Secondary | ICD-10-CM | POA: Diagnosis not present

## 2016-06-16 DIAGNOSIS — M545 Low back pain: Secondary | ICD-10-CM | POA: Diagnosis not present

## 2016-06-16 DIAGNOSIS — M546 Pain in thoracic spine: Secondary | ICD-10-CM | POA: Diagnosis not present

## 2016-06-16 DIAGNOSIS — M9901 Segmental and somatic dysfunction of cervical region: Secondary | ICD-10-CM | POA: Diagnosis not present

## 2016-06-16 DIAGNOSIS — M5136 Other intervertebral disc degeneration, lumbar region: Secondary | ICD-10-CM | POA: Diagnosis not present

## 2016-06-16 DIAGNOSIS — M9903 Segmental and somatic dysfunction of lumbar region: Secondary | ICD-10-CM | POA: Diagnosis not present

## 2016-06-16 DIAGNOSIS — M5030 Other cervical disc degeneration, unspecified cervical region: Secondary | ICD-10-CM | POA: Diagnosis not present

## 2016-06-18 ENCOUNTER — Ambulatory Visit (INDEPENDENT_AMBULATORY_CARE_PROVIDER_SITE_OTHER): Payer: Medicare Other | Admitting: Family Medicine

## 2016-06-18 VITALS — BP 122/74 | HR 68 | Temp 97.9°F | Wt 223.2 lb

## 2016-06-18 DIAGNOSIS — R300 Dysuria: Secondary | ICD-10-CM | POA: Diagnosis not present

## 2016-06-18 DIAGNOSIS — R350 Frequency of micturition: Secondary | ICD-10-CM | POA: Insufficient documentation

## 2016-06-18 DIAGNOSIS — N3001 Acute cystitis with hematuria: Secondary | ICD-10-CM | POA: Diagnosis not present

## 2016-06-18 LAB — URINALYSIS, MICROSCOPIC ONLY

## 2016-06-18 LAB — POCT URINALYSIS DIPSTICK
BILIRUBIN UA: NEGATIVE
GLUCOSE UA: NEGATIVE
Ketones, UA: NEGATIVE
NITRITE UA: NEGATIVE
Protein, UA: NEGATIVE
Spec Grav, UA: 1.02
Urobilinogen, UA: 0.2
pH, UA: 6

## 2016-06-18 MED ORDER — CEPHALEXIN 500 MG PO CAPS: 500.0000 mg | ORAL_CAPSULE | Freq: Two times a day (BID) | ORAL | 0 refills | Status: DC

## 2016-06-18 NOTE — Assessment & Plan Note (Signed)
Patient's symptoms and UA most consistent with UTI. We will treat with Keflex and send urine for culture and microscopy. She is given return precautions.

## 2016-06-18 NOTE — Progress Notes (Signed)
  Tommi Rumps, MD Phone: [phone]  Julie Perez is a 70 y.o. female who presents today for same day visit.  UTI: Patient reports 1 week of dysuria, urinary urgency, and urinary frequency. No abdominal pain or fevers. No vaginal discharge. Feels similar to her prior UTIs. Does not have a history of frequent UTIs.   ROS see history of present illness  Objective  Physical Exam Vitals:   06/18/16 0824  BP: 122/74  Pulse: 68  Temp: 97.9 F (36.6 C)    BP Readings from Last 3 Encounters:  06/18/16 122/74  05/20/16 120/74  09/09/15 120/60   Wt Readings from Last 3 Encounters:  06/18/16 223 lb 3.2 oz (101.2 kg)  05/20/16 221 lb 8 oz (100.5 kg)  09/09/15 215 lb 8 oz (97.8 kg)    Physical Exam  Constitutional: She is well-developed, well-nourished, and in no distress.  Cardiovascular: Normal rate, regular rhythm and normal heart sounds.   Pulmonary/Chest: Effort normal and breath sounds normal.  Abdominal: Soft. Bowel sounds are normal. She exhibits no distension. There is no tenderness. There is no rebound and no guarding.  Skin: Skin is warm and dry.     Assessment/Plan: Please see individual problem list.  UTI (urinary tract infection) Patient's symptoms and UA most consistent with UTI. We will treat with Keflex and send urine for culture and microscopy. She is given return precautions.   Orders Placed This Encounter  Procedures  . Urine Culture  . Urine Microscopic Only  . POCT Urinalysis Dipstick    Meds ordered this encounter  Medications  . DISCONTD: cephALEXin (KEFLEX) 500 MG capsule    Sig: Take 1 capsule (500 mg total) by mouth 2 (two) times daily.    Dispense:  14 capsule    Refill:  0  . cephALEXin (KEFLEX) 500 MG capsule    Sig: Take 1 capsule (500 mg total) by mouth 2 (two) times daily.    Dispense:  14 capsule    Refill:  0    Tommi Rumps, MD [place]

## 2016-06-18 NOTE — Patient Instructions (Signed)
Nice to meet you. You have a UTI. We will treat with Keflex. If you develop abdominal pain, fevers, chills, or any new or changing symptoms please seek medical attention.

## 2016-06-20 LAB — URINE CULTURE

## 2016-06-22 DIAGNOSIS — M5136 Other intervertebral disc degeneration, lumbar region: Secondary | ICD-10-CM | POA: Diagnosis not present

## 2016-06-22 DIAGNOSIS — M545 Low back pain: Secondary | ICD-10-CM | POA: Diagnosis not present

## 2016-06-22 DIAGNOSIS — M542 Cervicalgia: Secondary | ICD-10-CM | POA: Diagnosis not present

## 2016-06-22 DIAGNOSIS — M5134 Other intervertebral disc degeneration, thoracic region: Secondary | ICD-10-CM | POA: Diagnosis not present

## 2016-06-22 DIAGNOSIS — M9902 Segmental and somatic dysfunction of thoracic region: Secondary | ICD-10-CM | POA: Diagnosis not present

## 2016-06-22 DIAGNOSIS — M546 Pain in thoracic spine: Secondary | ICD-10-CM | POA: Diagnosis not present

## 2016-06-22 DIAGNOSIS — M5030 Other cervical disc degeneration, unspecified cervical region: Secondary | ICD-10-CM | POA: Diagnosis not present

## 2016-06-22 DIAGNOSIS — M9901 Segmental and somatic dysfunction of cervical region: Secondary | ICD-10-CM | POA: Diagnosis not present

## 2016-06-22 DIAGNOSIS — M9903 Segmental and somatic dysfunction of lumbar region: Secondary | ICD-10-CM | POA: Diagnosis not present

## 2016-06-28 ENCOUNTER — Other Ambulatory Visit: Payer: Self-pay | Admitting: Internal Medicine

## 2016-07-22 ENCOUNTER — Other Ambulatory Visit: Payer: Self-pay

## 2016-07-27 ENCOUNTER — Other Ambulatory Visit: Payer: Self-pay | Admitting: Internal Medicine

## 2016-08-14 ENCOUNTER — Other Ambulatory Visit: Payer: Self-pay | Admitting: Internal Medicine

## 2016-08-26 DIAGNOSIS — Z23 Encounter for immunization: Secondary | ICD-10-CM | POA: Diagnosis not present

## 2016-09-03 ENCOUNTER — Ambulatory Visit (INDEPENDENT_AMBULATORY_CARE_PROVIDER_SITE_OTHER): Payer: Medicare Other | Admitting: Family Medicine

## 2016-09-03 ENCOUNTER — Encounter: Payer: Self-pay | Admitting: Family Medicine

## 2016-09-03 VITALS — BP 138/72 | HR 67 | Temp 98.2°F | Wt 218.6 lb

## 2016-09-03 DIAGNOSIS — R300 Dysuria: Secondary | ICD-10-CM

## 2016-09-03 DIAGNOSIS — R8290 Unspecified abnormal findings in urine: Secondary | ICD-10-CM | POA: Diagnosis not present

## 2016-09-03 DIAGNOSIS — N3001 Acute cystitis with hematuria: Secondary | ICD-10-CM | POA: Diagnosis not present

## 2016-09-03 LAB — POCT URINALYSIS DIPSTICK
BILIRUBIN UA: NEGATIVE
Glucose, UA: NEGATIVE
KETONES UA: NEGATIVE
Nitrite, UA: NEGATIVE
PROTEIN UA: NEGATIVE
SPEC GRAV UA: 1.015
Urobilinogen, UA: 1
pH, UA: 6

## 2016-09-03 LAB — URINALYSIS, MICROSCOPIC ONLY

## 2016-09-03 MED ORDER — CEPHALEXIN 500 MG PO CAPS: 500.0000 mg | ORAL_CAPSULE | Freq: Two times a day (BID) | ORAL | 0 refills | Status: DC

## 2016-09-03 NOTE — Progress Notes (Signed)
Pre visit review using our clinic review tool, if applicable. No additional management support is needed unless otherwise documented below in the visit note. 

## 2016-09-03 NOTE — Progress Notes (Signed)
  Tommi Rumps, MD Phone: [phone]  Julie Perez is a 70 y.o. female who presents today for same-day visit.  UTI: Patient notes 3 weeks of dysuria, urinary frequency, and urinary urgency. No blood in her urine. No fevers. No vaginal discharge. No abdominal pain. Treated for UTI about a year ago. Recently seen by me though culture did not have evidence for infection. She wonders what she is doing to cause UTIs.   ROS see history of present illness  Objective  Physical Exam Vitals:   09/03/16 1304  BP: 138/72  Pulse: 67  Temp: 98.2 F (36.8 C)    BP Readings from Last 3 Encounters:  09/03/16 138/72  06/18/16 122/74  05/20/16 120/74   Wt Readings from Last 3 Encounters:  09/03/16 218 lb 9.6 oz (99.2 kg)  06/18/16 223 lb 3.2 oz (101.2 kg)  05/20/16 221 lb 8 oz (100.5 kg)    Physical Exam  Constitutional: She is well-developed, well-nourished, and in no distress.  Cardiovascular: Normal rate, regular rhythm and normal heart sounds.   Pulmonary/Chest: Effort normal and breath sounds normal.  Abdominal: Soft. Bowel sounds are normal. She exhibits no distension. There is no tenderness. There is no rebound and no guarding.     Assessment/Plan: Please see individual problem list.  UTI (urinary tract infection) Patient's symptoms and UA most consistent with UTI. We'll treat with Keflex. Send urine for culture and microscopy. I discussed that she is at increased risk for UTIs given her postmenopausal status and history of atrophic vaginitis. Also given that she is a female. Advised if she continues to have issues with this to follow-up to discuss potential treatments. Given return precautions.   Orders Placed This Encounter  Procedures  . Urine Culture  . Urine Microscopic Only  . POCT Urinalysis Dipstick    Meds ordered this encounter  Medications  . cephALEXin (KEFLEX) 500 MG capsule    Sig: Take 1 capsule (500 mg total) by mouth 2 (two) times daily.   Dispense:  14 capsule    Refill:  0    Tommi Rumps, MD [place]

## 2016-09-03 NOTE — Assessment & Plan Note (Signed)
Patient's symptoms and UA most consistent with UTI. We'll treat with Keflex. Send urine for culture and microscopy. I discussed that she is at increased risk for UTIs given her postmenopausal status and history of atrophic vaginitis. Also given that she is a female. Advised if she continues to have issues with this to follow-up to discuss potential treatments. Given return precautions.

## 2016-09-03 NOTE — Patient Instructions (Addendum)
Nice to see you. You likely have a UTI. We'll treat with Keflex. We will send your urine for culture. We will call with the results. If you develop abdominal pain, fevers, or any new or changing symptoms please seek medical attention.

## 2016-09-04 LAB — URINE CULTURE

## 2016-10-20 ENCOUNTER — Other Ambulatory Visit: Payer: Self-pay | Admitting: Internal Medicine

## 2016-10-20 NOTE — Telephone Encounter (Signed)
Spoke with pt & re-reviewed lab results from 06/17. After 4 weeks of the weekly dose of Vit D, pt was to start taking Vit D3 1,000 units daily. Pt notified & will pick up OTC this week.

## 2016-11-13 IMAGING — CR DG CHEST 2V
2 series · 2 of 2 positions shown · non-contrast
Comparison: None.

CLINICAL DATA: Dry cough.

EXAM:
CHEST  2 VIEW

[view not recorded (1 of 2)]
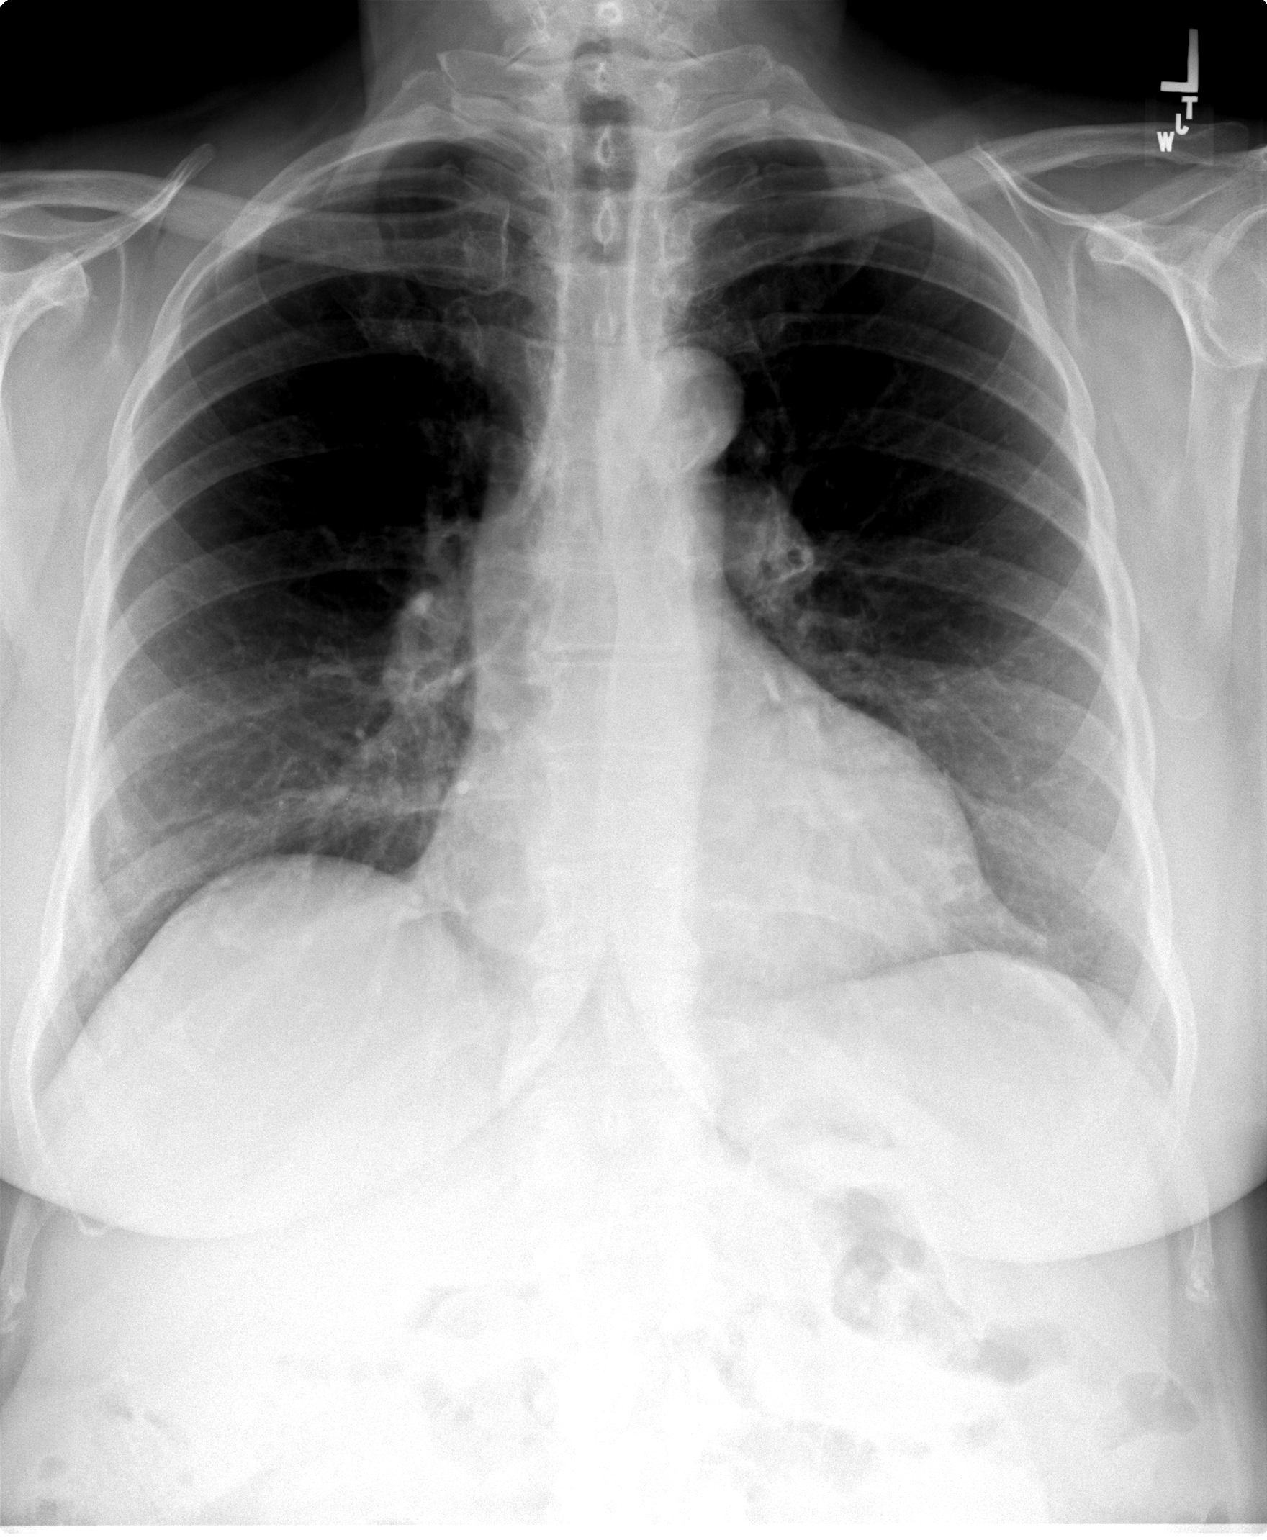

[view not recorded (2 of 2)]
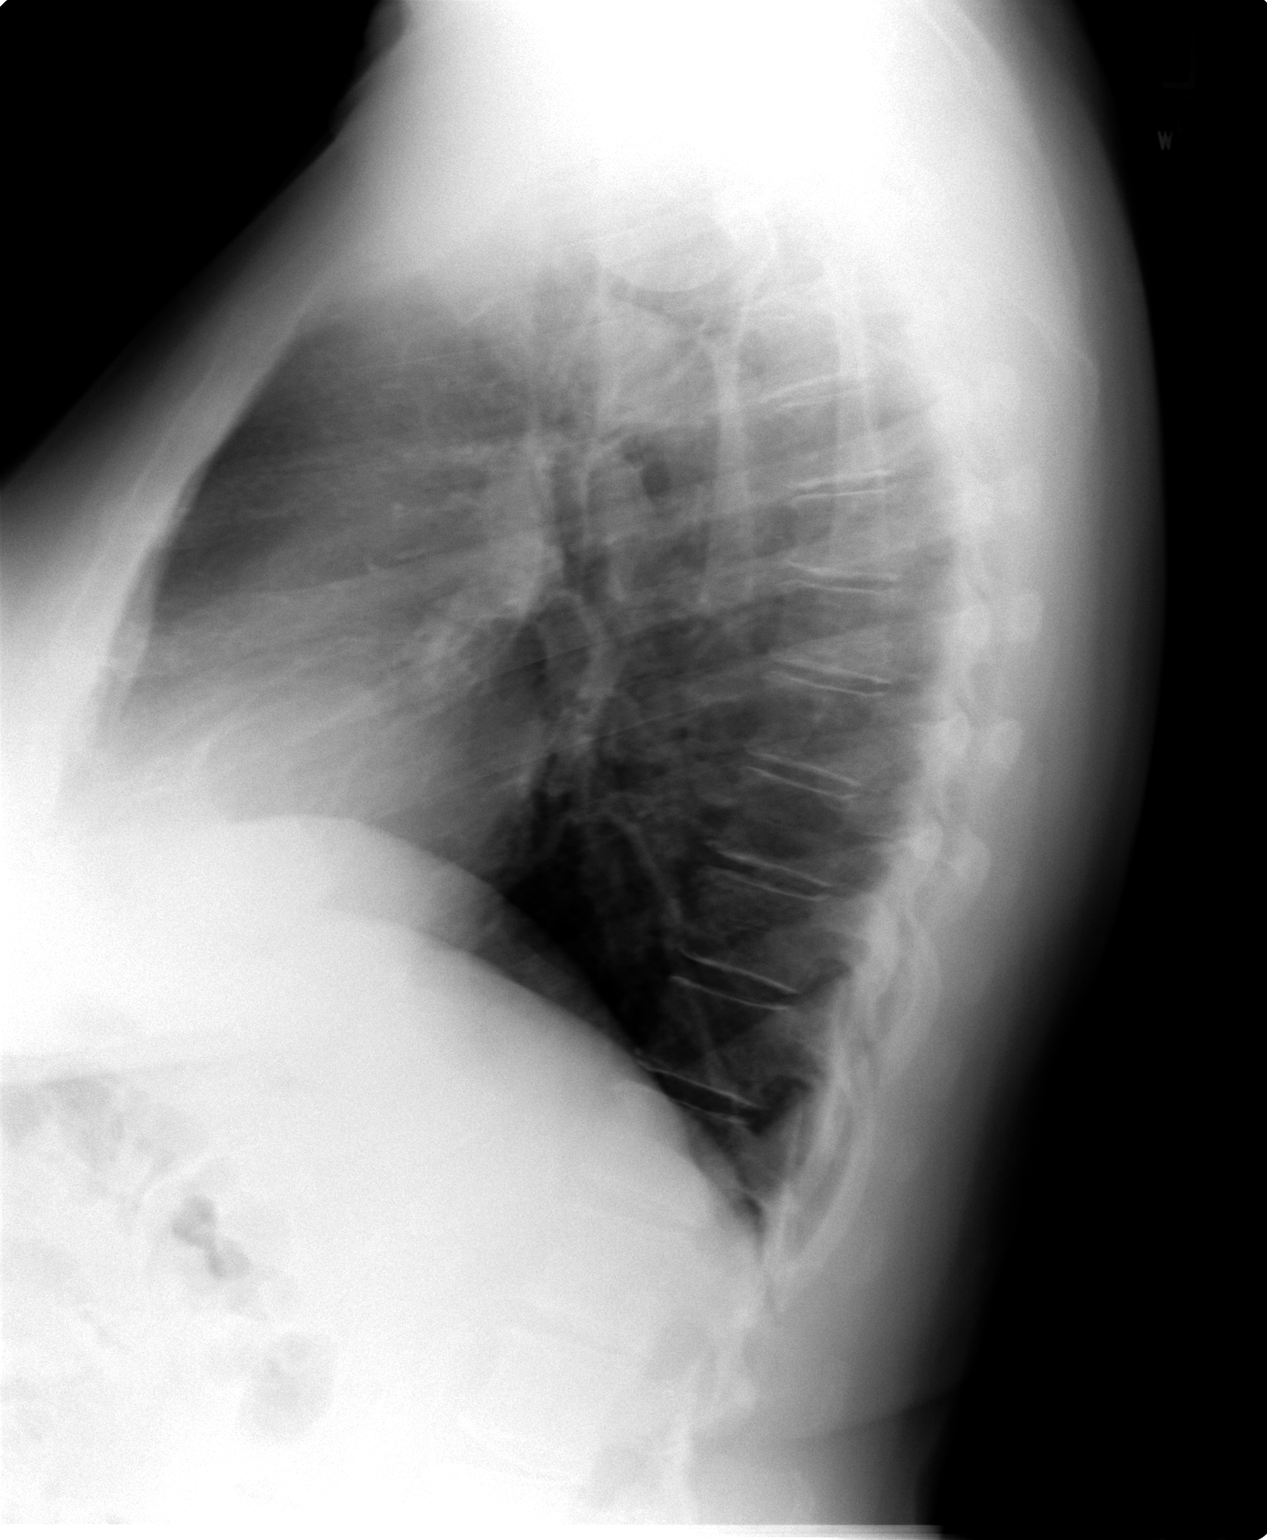

[2 of 2 positions shown; findings below may reference images not displayed]

FINDINGS: Mediastinum hilar structures are normal. Low lung volumes with mild
bibasilar atelectasis. Cardiomegaly with normal pulmonary
vascularity. No pleural effusion or pneumothorax.
IMPRESSION: 1. Mild cardiomegaly.  No CHF.
2. No acute pulmonary disease.

## 2016-11-20 ENCOUNTER — Other Ambulatory Visit: Payer: Self-pay | Admitting: Internal Medicine

## 2016-11-27 ENCOUNTER — Other Ambulatory Visit: Payer: Self-pay | Admitting: Internal Medicine

## 2016-11-30 ENCOUNTER — Ambulatory Visit: Payer: Medicare Other | Admitting: Internal Medicine

## 2016-12-25 DIAGNOSIS — M5136 Other intervertebral disc degeneration, lumbar region: Secondary | ICD-10-CM | POA: Diagnosis not present

## 2016-12-25 DIAGNOSIS — M9903 Segmental and somatic dysfunction of lumbar region: Secondary | ICD-10-CM | POA: Diagnosis not present

## 2016-12-25 DIAGNOSIS — M9902 Segmental and somatic dysfunction of thoracic region: Secondary | ICD-10-CM | POA: Diagnosis not present

## 2016-12-25 DIAGNOSIS — M542 Cervicalgia: Secondary | ICD-10-CM | POA: Diagnosis not present

## 2016-12-25 DIAGNOSIS — M5134 Other intervertebral disc degeneration, thoracic region: Secondary | ICD-10-CM | POA: Diagnosis not present

## 2016-12-25 DIAGNOSIS — M545 Low back pain: Secondary | ICD-10-CM | POA: Diagnosis not present

## 2016-12-25 DIAGNOSIS — M5030 Other cervical disc degeneration, unspecified cervical region: Secondary | ICD-10-CM | POA: Diagnosis not present

## 2016-12-25 DIAGNOSIS — M546 Pain in thoracic spine: Secondary | ICD-10-CM | POA: Diagnosis not present

## 2016-12-25 DIAGNOSIS — M9901 Segmental and somatic dysfunction of cervical region: Secondary | ICD-10-CM | POA: Diagnosis not present

## 2016-12-26 ENCOUNTER — Other Ambulatory Visit: Payer: Self-pay | Admitting: Internal Medicine

## 2017-01-04 ENCOUNTER — Encounter: Payer: Self-pay | Admitting: Internal Medicine

## 2017-01-04 ENCOUNTER — Ambulatory Visit (INDEPENDENT_AMBULATORY_CARE_PROVIDER_SITE_OTHER): Payer: Medicare HMO | Admitting: Internal Medicine

## 2017-01-04 VITALS — BP 124/76 | HR 58 | Resp 16 | Wt 220.0 lb

## 2017-01-04 DIAGNOSIS — G4733 Obstructive sleep apnea (adult) (pediatric): Secondary | ICD-10-CM

## 2017-01-04 DIAGNOSIS — E669 Obesity, unspecified: Secondary | ICD-10-CM

## 2017-01-04 DIAGNOSIS — I10 Essential (primary) hypertension: Secondary | ICD-10-CM | POA: Diagnosis not present

## 2017-01-04 DIAGNOSIS — E039 Hypothyroidism, unspecified: Secondary | ICD-10-CM | POA: Diagnosis not present

## 2017-01-04 DIAGNOSIS — R5383 Other fatigue: Secondary | ICD-10-CM

## 2017-01-04 DIAGNOSIS — N952 Postmenopausal atrophic vaginitis: Secondary | ICD-10-CM | POA: Diagnosis not present

## 2017-01-04 DIAGNOSIS — E2839 Other primary ovarian failure: Secondary | ICD-10-CM

## 2017-01-04 DIAGNOSIS — Z1231 Encounter for screening mammogram for malignant neoplasm of breast: Secondary | ICD-10-CM

## 2017-01-04 DIAGNOSIS — E785 Hyperlipidemia, unspecified: Secondary | ICD-10-CM

## 2017-01-04 DIAGNOSIS — Z79899 Other long term (current) drug therapy: Secondary | ICD-10-CM

## 2017-01-04 DIAGNOSIS — Z1239 Encounter for other screening for malignant neoplasm of breast: Secondary | ICD-10-CM

## 2017-01-04 DIAGNOSIS — R300 Dysuria: Secondary | ICD-10-CM | POA: Diagnosis not present

## 2017-01-04 DIAGNOSIS — R7301 Impaired fasting glucose: Secondary | ICD-10-CM | POA: Diagnosis not present

## 2017-01-04 DIAGNOSIS — E890 Postprocedural hypothyroidism: Secondary | ICD-10-CM | POA: Diagnosis not present

## 2017-01-04 DIAGNOSIS — E559 Vitamin D deficiency, unspecified: Secondary | ICD-10-CM

## 2017-01-04 DIAGNOSIS — I517 Cardiomegaly: Secondary | ICD-10-CM

## 2017-01-04 LAB — LIPID PANEL
CHOL/HDL RATIO: 3
CHOLESTEROL: 185 mg/dL (ref 0–200)
HDL: 53.5 mg/dL (ref >39.00)
LDL CALC: 98 mg/dL (ref 0–99)
NONHDL: 131.02
Triglycerides: 165 mg/dL — ABNORMAL HIGH (ref 0.0–149.0)
VLDL: 33 mg/dL (ref 0.0–40.0)

## 2017-01-04 LAB — COMPREHENSIVE METABOLIC PANEL
ALK PHOS: 64 U/L (ref 39–117)
ALT: 15 U/L (ref 0–35)
AST: 13 U/L (ref 0–37)
Albumin: 4.4 g/dL (ref 3.5–5.2)
BILIRUBIN TOTAL: 0.9 mg/dL (ref 0.2–1.2)
BUN: 15 mg/dL (ref 6–23)
CO2: 30 mEq/L (ref 19–32)
CREATININE: 0.72 mg/dL (ref 0.40–1.20)
Calcium: 9.3 mg/dL (ref 8.4–10.5)
Chloride: 103 mEq/L (ref 96–112)
GFR: 85.22 mL/min (ref >60.00)
GLUCOSE: 101 mg/dL — AB (ref 70–99)
Potassium: 4.4 mEq/L (ref 3.5–5.1)
SODIUM: 140 meq/L (ref 135–145)
TOTAL PROTEIN: 7.1 g/dL (ref 6.0–8.3)

## 2017-01-04 LAB — CBC WITH DIFFERENTIAL/PLATELET
BASOS PCT: 0.5 % (ref 0.0–3.0)
Basophils Absolute: 0 10*3/uL (ref 0.0–0.1)
EOS PCT: 1.9 % (ref 0.0–5.0)
Eosinophils Absolute: 0.1 10*3/uL (ref 0.0–0.7)
HCT: 39.9 % (ref 36.0–46.0)
HEMOGLOBIN: 13.7 g/dL (ref 12.0–15.0)
Lymphocytes Relative: 32.4 % (ref 12.0–46.0)
Lymphs Abs: 2.3 10*3/uL (ref 0.7–4.0)
MCHC: 34.3 g/dL (ref 30.0–36.0)
MCV: 88.7 fl (ref 78.0–100.0)
MONO ABS: 0.5 10*3/uL (ref 0.1–1.0)
MONOS PCT: 7 % (ref 3.0–12.0)
Neutro Abs: 4.2 10*3/uL (ref 1.4–7.7)
Neutrophils Relative %: 58.2 % (ref 43.0–77.0)
Platelets: 231 10*3/uL (ref 150.0–400.0)
RBC: 4.5 Mil/uL (ref 3.87–5.11)
RDW: 14.3 % (ref 11.5–15.5)
WBC: 7.2 10*3/uL (ref 4.0–10.5)

## 2017-01-04 LAB — TSH: TSH: 1.09 u[IU]/mL (ref 0.35–4.50)

## 2017-01-04 LAB — HEMOGLOBIN A1C: HEMOGLOBIN A1C: 5.5 % (ref 4.6–6.5)

## 2017-01-04 LAB — VITAMIN D 25 HYDROXY (VIT D DEFICIENCY, FRACTURES): VITD: 20.34 ng/mL — AB (ref 30.00–100.00)

## 2017-01-04 MED ORDER — ESTROGENS, CONJUGATED 0.625 MG/GM VA CREA: 1.0000 | TOPICAL_CREAM | Freq: Every day | VAGINAL | 12 refills | Status: DC

## 2017-01-04 NOTE — Progress Notes (Signed)
Pre visit review using our clinic review tool, if applicable. No additional management support is needed unless otherwise documented below in the visit note. 

## 2017-01-04 NOTE — Patient Instructions (Addendum)
YOUR GOAL TO GET YOUR BODY MASS INDEX (BMI)  < 30 IS A WEIGHT OF 190 LBS  TRIAL OF PREMARIN CREAM FOR ATROPHIC VAGINITIS  Atrophic Vaginitis Introduction Atrophic vaginitis is a condition in which the tissues that line the vagina become dry and thin. This condition is most common in women who have stopped having regular menstrual periods (menopause). This usually starts when a woman is 22-70 years old. Estrogen helps to keep the vagina moist. It stimulates the vagina to produce a clear fluid that lubricates the vagina for sexual intercourse. This fluid also protects the vagina from infection. Lack of estrogen can cause the lining of the vagina to get thinner and dryer. The vagina may also shrink in size. It may become less elastic. Atrophic vaginitis tends to get worse over time as a woman's estrogen level drops. What are the causes? This condition is caused by the normal drop in estrogen that happens around the time of menopause. What increases the risk? Certain conditions or situations may lower a woman's estrogen level, which increases her risk of atrophic vaginitis. These include:  Taking medicine that blocks estrogen.  Having ovaries removed surgically.  Being treated for cancer with X-ray treatment (radiation) or medicines (chemotherapy).  Exercising very hard and often.  Having an eating disorder (anorexia).  Giving birth or breastfeeding.  Being over the age of 60.  Smoking. What are the signs or symptoms? Symptoms of this condition include:  Pain, soreness, or bleeding during sexual intercourse (dyspareunia).  Vaginal burning, irritation, or itching.  Pain or bleeding during a vaginal examination using a speculum (pelvic exam).  Loss of interest in sexual activity.  Having burning pain when passing urine.  Vaginal discharge that is brown or yellow. In some cases, there are no symptoms. How is this diagnosed? This condition is diagnosed with a medical history and  physical exam. This will include a pelvic exam that checks whether the inside of your vagina appears pale, thin, or dry. Rarely, you may also have other tests, including:  A urine test.  A test that checks the acid balance in your vaginal fluid (acid balance test). How is this treated? Treatment for this condition may depend on the severity of your symptoms. Treatment may include:  Using an over-the-counter vaginal lubricant before you have sexual intercourse.  Using a long-acting vaginal moisturizer.  Using low-dose vaginal estrogen for moderate to severe symptoms that do not respond to other treatments. Options include creams, tablets, and inserts (vaginal rings). Before using vaginal estrogen, tell your health care provider if you have a history of:  Breast cancer.  Endometrial cancer.  Blood clots.  Taking medicines. You may be able to take a daily pill for dyspareunia. Discuss all of the risks of this medicine with your health care provider. It is usually not recommended for women who have a family history or personal history of breast cancer. If your symptoms are very mild and you are not sexually active, you may not need treatment. Follow these instructions at home:  Take medicines only as directed by your health care provider. Do not use herbal or alternative medicines unless your health care provider says that you can.  Use over-the-counter creams, lubricants, or moisturizers for dryness only as directed by your health care provider.  If your atrophic vaginitis is caused by menopause, discuss all of your menopausal symptoms and treatment options with your health care provider.  Do not douche.  Do not use products that can make your vagina  dry. These include:  Scented feminine sprays.  Scented tampons.  Scented soaps.  If it hurts to have sex, talk with your sexual partner. Contact a health care provider if:  Your discharge looks different than normal.  Your  vagina has an unusual smell.  You have new symptoms.  Your symptoms do not improve with treatment.  Your symptoms get worse. This information is not intended to replace advice given to you by your health care provider. Make sure you discuss any questions you have with your health care provider. Document Released: 03/26/2015 Document Revised: 04/16/2016 Document Reviewed: 10/31/2014  2017 Elsevier

## 2017-01-04 NOTE — Progress Notes (Addendum)
Subjective:  Patient ID: Julie Perez, female    DOB: 1946/08/26  Age: 71 y.o. MRN: [id number]  CC: The primary encounter diagnosis was Estrogen deficiency. Diagnoses of Breast cancer screening, Impaired fasting glucose, Vitamin D deficiency, Hyperlipidemia LDL goal <130, Fatigue, unspecified type, Acquired hypothyroidism, Hypothyroidism, postradioiodine therapy, OSA (obstructive sleep apnea), Postmenopausal atrophic vaginitis, Dysuria, Essential hypertension, Obesity (BMI 30.0-34.9), Long-term use of high-risk medication, and Cardiomegaly were also pertinent to this visit.  HPI Julie Perez presents for follow up on chronic conditions inncluding htn, hyperchol, hypothyroid  And obesity with OSA.  Last seen in June  Patient is taking her medications as prescribed and notes no adverse effects.  Home BP readings have been done about once per week and are  generally < 130/80 .  She is avoiding added salt in her diet and not exercising regularly.  She has recurrent episodes and has been treated empirically twice in the past 6 months ,  Both times the urine culture was negative for infection.    Due for DEXA  and mammogram  last DEXA 2009  TAKING 2000 IU D3   Outpatient Medications Prior to Visit  Medication Sig Dispense Refill  . amLODipine (NORVASC) 2.5 MG tablet TAKE 1 TABLET AT BEDTIME 90 tablet 2  . atorvastatin (LIPITOR) 40 MG tablet TAKE 1 TABLET DAILY AT 6 P.M. 90 tablet 1  . citalopram (CELEXA) 40 MG tablet TAKE 1 TABLET DAILY 90 tablet 1  . levothyroxine (SYNTHROID, LEVOTHROID) 112 MCG tablet TAKE ONE TABLET BY MOUTH ONCE DAILY BEFORE BREAKFAST. 90 tablet 1  . levothyroxine (SYNTHROID, LEVOTHROID) 112 MCG tablet TAKE ONE TABLET BY MOUTH ONCE DAILY BEFORE BREAKFAST. 90 tablet 2  . cephALEXin (KEFLEX) 500 MG capsule Take 1 capsule (500 mg total) by mouth 2 (two) times daily. 14 capsule 0  . Tdap (BOOSTRIX) 5-2.5-18.5 LF-MCG/0.5 injection Inject 0.5 mLs into the muscle once. 0.5  mL 0  . Cholecalciferol (VITAMIN D3) 1000 UNITS CAPS Take 1 capsule by mouth daily. Reported on 05/20/2016    . Cyanocobalamin (VITAMIN B-12) 2500 MCG SUBL Place 1 tablet under the tongue daily. Reported on 05/20/2016    . omeprazole (PRILOSEC) 40 MG capsule Take 1 capsule (40 mg total) by mouth daily. (Patient not taking: Reported on 01/04/2017) 30 capsule 3  . Vitamin D, Ergocalciferol, (DRISDOL) 50000 units CAPS capsule TAKE 1 CAPSULE ONCE A WEEK (Patient not taking: Reported on 01/04/2017) 12 capsule 0   No facility-administered medications prior to visit.     Review of Systems;  Patient denies headache, fevers, malaise, unintentional weight loss, skin rash, eye pain, sinus congestion and sinus pain, sore throat, dysphagia,  hemoptysis , cough, dyspnea, wheezing, chest pain, palpitations, orthopnea, edema, abdominal pain, nausea, melena, diarrhea, constipation, flank pain, dysuria, hematuria, urinary  Frequency, nocturia, numbness, tingling, seizures,  Focal weakness, Loss of consciousness,  Tremor, insomnia, depression, anxiety, and suicidal ideation.      Objective:  BP 124/76   Pulse (!) 58   Resp 16   Wt 220 lb (99.8 kg)   SpO2 96%   BMI 34.46 kg/m   BP Readings from Last 3 Encounters:  01/04/17 124/76  09/03/16 138/72  06/18/16 122/74    Wt Readings from Last 3 Encounters:  01/04/17 220 lb (99.8 kg)  09/03/16 218 lb 9.6 oz (99.2 kg)  06/18/16 223 lb 3.2 oz (101.2 kg)    General appearance: alert, cooperative and appears stated age Ears: normal TM's and external ear canals both ears  Throat: lips, mucosa, and tongue normal; teeth and gums normal Neck: no adenopathy, no carotid bruit, supple, symmetrical, trachea midline and thyroid not enlarged, symmetric, no tenderness/mass/nodules Back: symmetric, no curvature. ROM normal. No CVA tenderness. Lungs: clear to auscultation bilaterally Heart: regular rate and rhythm, S1, S2 normal, no murmur, click, rub or gallop Abdomen:  soft, non-tender; bowel sounds normal; no masses,  no organomegaly Pulses: 2+ and symmetric Skin: Skin color, texture, turgor normal. No rashes or lesions Lymph nodes: Cervical, supraclavicular, and axillary nodes normal.  Lab Results  Component Value Date   HGBA1C 5.5 01/04/2017    Lab Results  Component Value Date   CREATININE 0.72 01/04/2017   CREATININE 0.74 05/20/2016   CREATININE 0.70 07/17/2015    Lab Results  Component Value Date   WBC 7.2 01/04/2017   HGB 13.7 01/04/2017   HCT 39.9 01/04/2017   PLT 231.0 01/04/2017   GLUCOSE 101 (H) 01/04/2017   CHOL 185 01/04/2017   TRIG 165.0 (H) 01/04/2017   HDL 53.50 01/04/2017   LDLDIRECT 113.0 05/20/2016   LDLCALC 98 01/04/2017   ALT 15 01/04/2017   AST 13 01/04/2017   NA 140 01/04/2017   K 4.4 01/04/2017   CL 103 01/04/2017   CREATININE 0.72 01/04/2017   BUN 15 01/04/2017   CO2 30 01/04/2017   TSH 1.09 01/04/2017   HGBA1C 5.5 01/04/2017    Assessment & Plan:   Problem List Items Addressed This Visit    Cardiomegaly    Incidental finding on 2016 chest x ray. If true it is likely due to untreated OSA.   She has no sings  of heart failure, nor heart murmur,  and bp is well controlled . No workup unless she becomes symptomatic       Dysuria    Without UTI confirmation x 2 (July and Oct 2017) .  symptoms likely due to atrophic vaginitis.  Addressed today trial of Premarin cream       Essential hypertension    Well controlled on current regimen. Renal function stable, no changes today.  Lab Results  Component Value Date   CREATININE 0.72 01/04/2017   Lab Results  Component Value Date   NA 140 01/04/2017   K 4.4 01/04/2017   CL 103 01/04/2017   CO2 30 01/04/2017         Fatigue   Relevant Orders   Comprehensive metabolic panel (Completed)   CBC with Differential/Platelet (Completed)   Hyperlipidemia LDL goal <130    LDL and triglycerides are at goal on current medications. sHe has no side effects and  liver enzymes are normal. No changes today  Lab Results  Component Value Date   CHOL 185 01/04/2017   HDL 53.50 01/04/2017   LDLCALC 98 01/04/2017   LDLDIRECT 113.0 05/20/2016   TRIG 165.0 (H) 01/04/2017   CHOLHDL 3 01/04/2017   Lab Results  Component Value Date   ALT 15 01/04/2017   AST 13 01/04/2017   ALKPHOS 64 01/04/2017   BILITOT 0.9 01/04/2017         Relevant Orders   Lipid panel (Completed)   Hypothyroidism, postradioiodine therapy    Thyroid function is more active on current dose of levothyroxine 140mcg.   no changes today  Lab Results  Component Value Date   TSH 1.09 01/04/2017              Long-term use of high-risk medication    Taking statin,  LFTs are normal on 6 month check  done  Today.  Lab Results  Component Value Date   ALT 15 01/04/2017   AST 13 01/04/2017   ALKPHOS 64 01/04/2017   BILITOT 0.9 01/04/2017          Obesity (BMI 30.0-34.9)    I have addressed  BMI and recommended wt loss of 10% of body weigh over the next 6 months using a low glycemic index diet and regular exercise a minimum of 5 days per week.  Negative screen for occult prediabetes or diabetes   Lab Results  Component Value Date   HGBA1C 5.5 01/04/2017   Lab Results  Component Value Date   CHOL 185 01/04/2017   HDL 53.50 01/04/2017   LDLCALC 98 01/04/2017   LDLDIRECT 113.0 05/20/2016   TRIG 165.0 (H) 01/04/2017   CHOLHDL 3 01/04/2017         OSA (obstructive sleep apnea)    diagnosed with prior sleep study but treatment has been continually deferred  by patient.  Discussed the long term history of OSA , the risks of long term damage to heart and the signs and symptoms attributable to OSA.  Advised patient to consider  significant weight loss and/or use of CPAP.       Postmenopausal atrophic vaginitis    Trial of premarin cream discussed and rx given       Vitamin D deficiency    Low again, .  Drisdol  Weekly x 3 months       Relevant Orders    VITAMIN D 25 Hydroxy (Vit-D Deficiency, Fractures) (Completed)    Other Visit Diagnoses    Estrogen deficiency    -  Primary   Relevant Orders   DG Bone Density   Breast cancer screening       Relevant Orders   MM SCREENING BREAST TOMO BILATERAL   Impaired fasting glucose       Relevant Orders   Hemoglobin A1c (Completed)   Acquired hypothyroidism       Relevant Orders   TSH (Completed)      I have discontinued Ms. Misenheimer's Tdap, Vitamin D (Ergocalciferol), and cephALEXin. I am also having her start on conjugated estrogens and ergocalciferol. Additionally, I am having her maintain her Vitamin D3, Vitamin B-12, omeprazole, levothyroxine, levothyroxine, amLODipine, atorvastatin, and citalopram.  Meds ordered this encounter  Medications  . conjugated estrogens (PREMARIN) vaginal cream    Sig: Place 1 Applicatorful vaginally daily. FOR 2 WEEKS,  THEN TWICE WEEKLY THEREAFTER    Dispense:  42.5 g    Refill:  12  . ergocalciferol (DRISDOL) 50000 units capsule    Sig: Take 1 capsule (50,000 Units total) by mouth once a week.    Dispense:  4 capsule    Refill:  2   A total of 40 minutes was spent with patient more than half of which was spent in counseling patient on the above mentioned issues , reviewing and explaining recent labs and imaging studies done, and coordination of care. Medications Discontinued During This Encounter  Medication Reason  . cephALEXin (KEFLEX) 500 MG capsule Completed Course  . Vitamin D, Ergocalciferol, (DRISDOL) 50000 units CAPS capsule Completed Course  . Tdap ([place]) 5-2.5-18.5 LF-MCG/0.5 injection Completed Course    Follow-up: Return in about 6 months (around 07/04/2017) for ANNUAL CPE.   Crecencio Mc, MD

## 2017-01-05 DIAGNOSIS — E669 Obesity, unspecified: Secondary | ICD-10-CM | POA: Insufficient documentation

## 2017-01-05 NOTE — Assessment & Plan Note (Signed)
diagnosed with prior sleep study but treatment has been continually deferred  by patient.  Discussed the long term history of OSA , the risks of long term damage to heart and the signs and symptoms attributable to OSA.  Advised patient to consider  significant weight loss and/or use of CPAP.

## 2017-01-05 NOTE — Assessment & Plan Note (Signed)
Taking statin,  LFTs are normal on 6 month check done  Today.  Lab Results  Component Value Date   ALT 15 01/04/2017   AST 13 01/04/2017   ALKPHOS 64 01/04/2017   BILITOT 0.9 01/04/2017

## 2017-01-05 NOTE — Assessment & Plan Note (Signed)
Without UTI confirmation x 2 (July and Oct 2017) .  symptoms likely due to atrophic vaginitis.  Addressed today trial of Premarin cream

## 2017-01-05 NOTE — Assessment & Plan Note (Addendum)
I have addressed  BMI and recommended wt loss of 10% of body weigh over the next 6 months using a low glycemic index diet and regular exercise a minimum of 5 days per week.  Negative screen for occult prediabetes or diabetes   Lab Results  Component Value Date   HGBA1C 5.5 01/04/2017   Lab Results  Component Value Date   CHOL 185 01/04/2017   HDL 53.50 01/04/2017   LDLCALC 98 01/04/2017   LDLDIRECT 113.0 05/20/2016   TRIG 165.0 (H) 01/04/2017   CHOLHDL 3 01/04/2017

## 2017-01-05 NOTE — Assessment & Plan Note (Signed)
Trial of premarin cream discussed and rx given

## 2017-01-05 NOTE — Assessment & Plan Note (Addendum)
Incidental finding on 2016 chest x ray. If true it is likely due to untreated OSA.   She has no sings  of heart failure, nor heart murmur,  and bp is well controlled . No workup unless she becomes symptomatic

## 2017-01-05 NOTE — Assessment & Plan Note (Signed)
LDL and triglycerides are at goal on current medications. sHe has no side effects and liver enzymes are normal. No changes today  Lab Results  Component Value Date   CHOL 185 01/04/2017   HDL 53.50 01/04/2017   LDLCALC 98 01/04/2017   LDLDIRECT 113.0 05/20/2016   TRIG 165.0 (H) 01/04/2017   CHOLHDL 3 01/04/2017   Lab Results  Component Value Date   ALT 15 01/04/2017   AST 13 01/04/2017   ALKPHOS 64 01/04/2017   BILITOT 0.9 01/04/2017

## 2017-01-05 NOTE — Assessment & Plan Note (Signed)
Thyroid function is more active on current dose of levothyroxine 171mcg.   no changes today  Lab Results  Component Value Date   TSH 1.09 01/04/2017

## 2017-01-05 NOTE — Assessment & Plan Note (Signed)
Well controlled on current regimen. Renal function stable, no changes today.  Lab Results  Component Value Date   CREATININE 0.72 01/04/2017   Lab Results  Component Value Date   NA 140 01/04/2017   K 4.4 01/04/2017   CL 103 01/04/2017   CO2 30 01/04/2017

## 2017-01-06 MED ORDER — ERGOCALCIFEROL 1.25 MG (50000 UT) PO CAPS: 50000.0000 [IU] | ORAL_CAPSULE | ORAL | 2 refills | Status: DC

## 2017-01-06 NOTE — Addendum Note (Signed)
Addended by: Crecencio Mc on: 01/06/2017 11:38 PM   Modules accepted: Orders

## 2017-01-06 NOTE — Assessment & Plan Note (Signed)
Low again, .  Drisdol  Weekly x 3 months

## 2017-01-12 ENCOUNTER — Telehealth: Payer: Self-pay | Admitting: Internal Medicine

## 2017-01-12 DIAGNOSIS — N390 Urinary tract infection, site not specified: Secondary | ICD-10-CM | POA: Diagnosis not present

## 2017-01-12 DIAGNOSIS — R3129 Other microscopic hematuria: Secondary | ICD-10-CM | POA: Diagnosis not present

## 2017-01-12 DIAGNOSIS — R300 Dysuria: Secondary | ICD-10-CM | POA: Diagnosis not present

## 2017-01-12 NOTE — Telephone Encounter (Signed)
Pt called and stated that she has been urinating her pant since about Thursday. Pt is not sure if it is due to conjugated estrogens (PREMARIN) vaginal cream. When she does go to the bathroom normally she states that she gets a tingly feeling through out her body. [place]. Please advise, thank  you!  Call pt @ [phone]

## 2017-01-12 NOTE — Telephone Encounter (Signed)
She Needs to go to an urgent care because it may not be infection given history of atrophic vaginitis

## 2017-01-12 NOTE — Telephone Encounter (Signed)
Reason for call:urinary incontinence Symptoms: urinary has odor fishy, urine color  yellow  appearance clear, urinary frequency, urinary incontinence, dysuria , gets tingly sensation when urinates  Duration Saturday Medications: stopped premarin cream on Friday  Last seen for this problem: Seen by:01/04/17 Patient is out of town in [place], [place] Please advise

## 2017-01-12 NOTE — Telephone Encounter (Signed)
Patient advised of below and verbalized an understanding  

## 2017-01-13 DIAGNOSIS — N390 Urinary tract infection, site not specified: Secondary | ICD-10-CM | POA: Diagnosis not present

## 2017-01-20 ENCOUNTER — Other Ambulatory Visit: Payer: Self-pay | Admitting: Internal Medicine

## 2017-01-26 ENCOUNTER — Encounter: Payer: Self-pay | Admitting: Family

## 2017-01-26 ENCOUNTER — Ambulatory Visit (INDEPENDENT_AMBULATORY_CARE_PROVIDER_SITE_OTHER): Payer: Medicare HMO | Admitting: Family

## 2017-01-26 VITALS — BP 122/64 | HR 60 | Temp 97.8°F | Wt 216.8 lb

## 2017-01-26 DIAGNOSIS — R110 Nausea: Secondary | ICD-10-CM

## 2017-01-26 MED ORDER — ONDANSETRON 4 MG PO TBDP: 4.0000 mg | ORAL_TABLET | Freq: Three times a day (TID) | ORAL | 0 refills | Status: DC | PRN

## 2017-01-26 NOTE — Patient Instructions (Signed)
Use zofran as needed for nausea Continue bland diet  If there is no improvement in your symptoms, or if there is any worsening of symptoms, or if you have any additional concerns, please return for re-evaluation; or, if we are closed, consider going to the Emergency Room for evaluation if symptoms urgent.   Gastritis, Adult Gastritis is inflammation of the stomach. There are two kinds of gastritis:  Acute gastritis. This kind develops suddenly.  Chronic gastritis. This kind lasts for a long time. Gastritis happens when the lining of the stomach becomes weak or gets damaged. Without treatment, gastritis can lead to stomach bleeding and ulcers. What are the causes? This condition may be caused by:  An infection.  Drinking too much alcohol.  Certain medicines.  Having too much acid in the stomach.  A disease of the intestines or stomach.  Stress. What are the signs or symptoms? Symptoms of this condition include:  Pain or a burning in the upper abdomen.  Nausea.  Vomiting.  An uncomfortable feeling of fullness after eating. In some cases, there are no symptoms. How is this diagnosed? This condition may be diagnosed with:  A description of your symptoms.  A physical exam.  Tests. These can include:  Blood tests.  Stool tests.  A test in which a thin, flexible instrument with a light and camera on the end is passed down the esophagus and into the stomach (upper endoscopy).  A test in which a sample of tissue is taken for testing (biopsy). How is this treated? This condition may be treated with medicines. If the condition is caused by a bacterial infection, you may be given antibiotic medicines. If it is caused by too much acid in the stomach, you may get medicines called H2 blockers, proton pump inhibitors, or antacids. Treatment may also involve stopping the use of certain medicines, such as aspirin, ibuprofen, or other nonsteroidal anti-inflammatory drugs  (NSAIDs). Follow these instructions at home:  Take over-the-counter and prescription medicines only as told by your health care provider.  If you were prescribed an antibiotic, take it as told by your health care provider. Do not stop taking the antibiotic even if you start to feel better.  Drink enough fluid to keep your urine clear or pale yellow.  Eat small, frequent meals instead of large meals. Contact a health care provider if:  Your symptoms get worse.  Your symptoms return after treatment. Get help right away if:  You vomit blood or material that looks like coffee grounds.  You have black or dark red stools.  You are unable to keep fluids down.  Your abdominal pain gets worse.  You have a fever.  You do not feel better after 1 week. This information is not intended to replace advice given to you by your health care provider. Make sure you discuss any questions you have with your health care provider. Document Released: 11/03/2001 Document Revised: 07/08/2016 Document Reviewed: 08/03/2015 Elsevier Interactive Patient Education  2017 [employer].

## 2017-01-26 NOTE — Progress Notes (Signed)
Subjective:    Patient ID: Julie Perez, female    DOB: [birth year], 71 y.o.   MRN: [id number]  CC: Julie Perez is a 71 y.o. female who presents today for an acute visit.    HPI: CC: nausea x 3 days ago. Initially brown watery diarrhea, resolved. More formed today. Non blood emesis. No vomiting or diarrhea today or yesterday. No fever, abdominal pain, dizziness. Endorses continued nausea. Drinking water, eating eggs, chicken noodle soup.   Was in [place] last week to see grandchildren and treated with cipro for UTI. Had stopped the celexa while on antibiotic and didn't feel 'well' so started back on celexa. No urinary frequency, dysuria.       HISTORY:  Past Medical History:  Diagnosis Date  . Frequent headaches    stress  related  . Hyperlipidemia   . Migraines   . Thyroid disease    Past Surgical History:  Procedure Laterality Date  . ABDOMINAL HYSTERECTOMY  1979   Family History  Problem Relation Age of Onset  . Hypertension Mother   . Cancer Father 5    prostate cancer  . Cancer Sister 30    breast cancer  . Diabetes Brother     type 2  . Heart disease Sister     cabg x 4, tobacco abuse  . Diabetes Sister     type 2  . Aneurysm Sister     Allergies: Patient has no known allergies. Current Outpatient Prescriptions on File Prior to Visit  Medication Sig Dispense Refill  . amLODipine (NORVASC) 2.5 MG tablet TAKE 1 TABLET AT BEDTIME 90 tablet 2  . atorvastatin (LIPITOR) 40 MG tablet TAKE 1 TABLET DAILY AT 6 P.M. 90 tablet 1  . Cholecalciferol (VITAMIN D3) 1000 UNITS CAPS Take 1 capsule by mouth daily. Reported on 05/20/2016    . citalopram (CELEXA) 40 MG tablet TAKE 1 TABLET DAILY 90 tablet 1  . conjugated estrogens (PREMARIN) vaginal cream Place 1 Applicatorful vaginally daily. FOR 2 WEEKS,  THEN TWICE WEEKLY THEREAFTER 42.5 g 12  . Cyanocobalamin (VITAMIN B-12) 2500 MCG SUBL Place 1 tablet under the tongue daily. Reported on 05/20/2016    . ergocalciferol  (DRISDOL) 50000 units capsule Take 1 capsule (50,000 Units total) by mouth once a week. 4 capsule 2  . levothyroxine (SYNTHROID, LEVOTHROID) 112 MCG tablet TAKE ONE TABLET BY MOUTH ONCE DAILY BEFORE BREAKFAST. 90 tablet 1  . levothyroxine (SYNTHROID, LEVOTHROID) 112 MCG tablet TAKE ONE TABLET BY MOUTH ONCE DAILY BEFORE BREAKFAST. 90 tablet 2  . levothyroxine (SYNTHROID, LEVOTHROID) 112 MCG tablet TAKE 1 TABLET DAILY BEFORE BREAKFAST 90 tablet 0   No current facility-administered medications on file prior to visit.     Social History  Substance Use Topics  . Smoking status: Never Smoker  . Smokeless tobacco: Never Used  . Alcohol use 4.2 oz/week    7 Glasses of wine per week     Comment: occasionally    Review of Systems  Constitutional: Negative for chills and fever.  Respiratory: Negative for cough.   Cardiovascular: Negative for chest pain and palpitations.  Gastrointestinal: Positive for diarrhea and vomiting. Negative for abdominal distention, abdominal pain, constipation and nausea.  Neurological: Negative for dizziness.      Objective:    BP 122/64 (BP Location: Right Arm, Patient Position: Sitting, Cuff Size: Large)   Pulse 60   Temp 97.8 F (36.6 C) (Oral)   Wt 216 lb 12.8 oz (98.3 kg)   SpO2  98%   BMI 33.96 kg/m    Physical Exam  Constitutional: She appears well-developed and well-nourished.  Eyes: Conjunctivae are normal.  Cardiovascular: Normal rate, regular rhythm, normal heart sounds and normal pulses.   Pulmonary/Chest: Effort normal and breath sounds normal. She has no wheezes. She has no rhonchi. She has no rales.  Abdominal: Soft. Normal appearance and bowel sounds are normal. She exhibits no distension, no fluid wave, no ascites and no mass. There is no tenderness. There is no rigidity, no rebound, no guarding and no CVA tenderness.  Neurological: She is alert.  Skin: Skin is warm and dry.  Psychiatric: She has a normal mood and affect. Her speech is  normal and behavior is normal. Thought content normal.  Vitals reviewed.      Assessment & Plan:   1. Nausea Normotensive. Diarrhea and vomiting have resolved. No fever. Reassured by clinical symptoms as well as benign abdominal exam. Reassurance provided to patient, advised bland diet, Zofran as needed.   - ondansetron (ZOFRAN ODT) 4 MG disintegrating tablet; Take 1 tablet (4 mg total) by mouth every 8 (eight) hours as needed for nausea or vomiting.  Dispense: 20 tablet; Refill: 0    I have discontinued Julie Perez omeprazole. I am also having her maintain her Vitamin D3, Vitamin B-12, levothyroxine, levothyroxine, amLODipine, atorvastatin, citalopram, conjugated estrogens, ergocalciferol, and levothyroxine.   No orders of the defined types were placed in this encounter.   Return precautions given.   Risks, benefits, and alternatives of the medications and treatment plan prescribed today were discussed, and patient expressed understanding.   Education regarding symptom management and diagnosis given to patient on AVS.  Continue to follow with TULLO, Aris Everts, MD for routine health maintenance.   Julie Perez and I agreed with plan.   Mable Paris, FNP

## 2017-01-26 NOTE — Progress Notes (Signed)
Pre visit review using our clinic review tool, if applicable. No additional management support is needed unless otherwise documented below in the visit note. 

## 2017-01-29 ENCOUNTER — Emergency Department
Admission: EM | Admit: 2017-01-29 | Discharge: 2017-01-29 | Disposition: A | Discharge status: Home / Self Care | Payer: Medicare HMO | Attending: Emergency Medicine | Admitting: Emergency Medicine

## 2017-01-29 ENCOUNTER — Encounter: Payer: Self-pay | Admitting: Emergency Medicine

## 2017-01-29 ENCOUNTER — Telehealth: Payer: Self-pay | Admitting: *Deleted

## 2017-01-29 DIAGNOSIS — Z79899 Other long term (current) drug therapy: Secondary | ICD-10-CM | POA: Insufficient documentation

## 2017-01-29 DIAGNOSIS — R197 Diarrhea, unspecified: Secondary | ICD-10-CM | POA: Diagnosis not present

## 2017-01-29 DIAGNOSIS — I10 Essential (primary) hypertension: Secondary | ICD-10-CM | POA: Diagnosis not present

## 2017-01-29 DIAGNOSIS — R112 Nausea with vomiting, unspecified: Secondary | ICD-10-CM | POA: Diagnosis not present

## 2017-01-29 DIAGNOSIS — E039 Hypothyroidism, unspecified: Secondary | ICD-10-CM | POA: Insufficient documentation

## 2017-01-29 LAB — COMPREHENSIVE METABOLIC PANEL
ALBUMIN: 4.2 g/dL (ref 3.5–5.0)
ALK PHOS: 60 U/L (ref 38–126)
ALT: 25 U/L (ref 14–54)
ANION GAP: 9 (ref 5–15)
AST: 25 U/L (ref 15–41)
BILIRUBIN TOTAL: 1.3 mg/dL — AB (ref 0.3–1.2)
BUN: 15 mg/dL (ref 6–20)
CALCIUM: 8.1 mg/dL — AB (ref 8.9–10.3)
CO2: 24 mmol/L (ref 22–32)
CREATININE: 0.96 mg/dL (ref 0.44–1.00)
Chloride: 106 mmol/L (ref 101–111)
GFR calc non Af Amer: 59 mL/min — ABNORMAL LOW (ref >60)
GLUCOSE: 107 mg/dL — AB (ref 65–99)
Potassium: 3.1 mmol/L — ABNORMAL LOW (ref 3.5–5.1)
Sodium: 139 mmol/L (ref 135–145)
TOTAL PROTEIN: 7.4 g/dL (ref 6.5–8.1)

## 2017-01-29 LAB — LIPASE, BLOOD: Lipase: 10 U/L — ABNORMAL LOW (ref 11–51)

## 2017-01-29 LAB — CBC
HCT: 39.8 % (ref 35.0–47.0)
HEMOGLOBIN: 13.7 g/dL (ref 12.0–16.0)
MCH: 30 pg (ref 26.0–34.0)
MCHC: 34.4 g/dL (ref 32.0–36.0)
MCV: 87.3 fL (ref 80.0–100.0)
PLATELETS: 235 10*3/uL (ref 150–440)
RBC: 4.56 MIL/uL (ref 3.80–5.20)
RDW: 13.8 % (ref 11.5–14.5)
WBC: 10.2 10*3/uL (ref 3.6–11.0)

## 2017-01-29 MED ORDER — POTASSIUM CHLORIDE CRYS ER 20 MEQ PO TBCR
40.0000 meq | EXTENDED_RELEASE_TABLET | Freq: Once | ORAL | Status: AC
  Administered 2017-01-29: 40 meq via ORAL
  Filled 2017-01-29: qty 2

## 2017-01-29 NOTE — ED Provider Notes (Signed)
[facility] Emergency Department Provider Note  ____________________________________________   First MD Initiated Contact with Patient 01/29/17 1346     (approximate)  I have reviewed the triage vital signs and the nursing notes.   HISTORY  Chief Complaint Emesis and Diarrhea   HPI Julie Perez is a 71 y.o. female with a history of hyperlipidemia who is presenting to the emergency department with 1-2 weeks of nausea vomiting and diarrhea. She says that she was sent in by her primary care doctor after she had called into the office and said that she was having dark/black stools. The patient says that last night up until about 3:30 she was having vomiting and diarrhea. However, at this time she has not had any further episodes of vomiting or diarrhea. Said that she was recently on a course of Cipro several weeks ago. Denying any pain at this time. Says that she is using Zofran at home. Is no longer on the antibiotics. No known sick contacts but did have a recent trip to [place].   Past Medical History:  Diagnosis Date  . Frequent headaches    stress  related  . Hyperlipidemia   . Migraines   . Thyroid disease     Patient Active Problem List   Diagnosis Date Noted  . Obesity (BMI 30.0-34.9) 01/05/2017  . Dysuria 06/18/2016  . Fatigue 05/26/2015  . Cardiomegaly 04/20/2015  . Essential hypertension 04/20/2015  . Long-term use of high-risk medication 11/19/2014  . Cephalalgia 10/02/2014  . Postmenopausal atrophic vaginitis 09/15/2014  . Encounter for Medicare annual wellness exam 09/15/2014  . H/O herpes genitalis 09/15/2014  . Generalized anxiety disorder 09/15/2014  . S/P hysterectomy 09/12/2014  . B12 deficiency 06/22/2014  . Vitamin D deficiency 06/22/2014  . OSA (obstructive sleep apnea) 06/22/2014  . 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 Hyperlipidemia LDL goal <130 04/09/2008    Past Surgical History:  Procedure  Laterality Date  . ABDOMINAL HYSTERECTOMY  1979    Prior to Admission medications   Medication Sig Start Date End Date Taking? Authorizing Provider  amLODipine (NORVASC) 2.5 MG tablet TAKE 1 TABLET AT BEDTIME 11/20/16   Crecencio Mc, MD  atorvastatin (LIPITOR) 40 MG tablet TAKE 1 TABLET DAILY AT 6 P.M. 11/27/16   Crecencio Mc, MD  Cholecalciferol (VITAMIN D3) 1000 UNITS CAPS Take 1 capsule by mouth daily. Reported on 05/20/2016    Historical Provider, MD  citalopram (CELEXA) 40 MG tablet TAKE 1 TABLET DAILY 12/28/16   Crecencio Mc, MD  conjugated estrogens (PREMARIN) vaginal cream Place 1 Applicatorful vaginally daily. FOR 2 WEEKS,  THEN TWICE WEEKLY THEREAFTER 01/04/17   Crecencio Mc, MD  Cyanocobalamin (VITAMIN B-12) 2500 MCG SUBL Place 1 tablet under the tongue daily. Reported on 05/20/2016    Historical Provider, MD  ergocalciferol (DRISDOL) 50000 units capsule Take 1 capsule (50,000 Units total) by mouth once a week. 01/06/17   Crecencio Mc, MD  levothyroxine (SYNTHROID, LEVOTHROID) 112 MCG tablet TAKE ONE TABLET BY MOUTH ONCE DAILY BEFORE BREAKFAST. 05/22/16   Crecencio Mc, MD  levothyroxine (SYNTHROID, LEVOTHROID) 112 MCG tablet TAKE ONE TABLET BY MOUTH ONCE DAILY BEFORE BREAKFAST. 08/14/16   Crecencio Mc, MD  levothyroxine (SYNTHROID, LEVOTHROID) 112 MCG tablet TAKE 1 TABLET DAILY BEFORE BREAKFAST 01/20/17   Crecencio Mc, MD  ondansetron (ZOFRAN ODT) 4 MG disintegrating tablet Take 1 tablet (4 mg total) by mouth every 8 (eight) hours as needed for nausea or vomiting. 01/26/17  Burnard Hawthorne, FNP    Allergies Patient has no known allergies.  Family History  Problem Relation Age of Onset  . Hypertension Mother   . Cancer Father 84    prostate cancer  . Cancer Sister 8    breast cancer  . Diabetes Brother     type 2  . Heart disease Sister     cabg x 4, tobacco abuse  . Diabetes Sister     type 2  . Aneurysm Sister     Social History Social History  Substance Use  Topics  . Smoking status: Never Smoker  . Smokeless tobacco: Never Used  . Alcohol use 4.2 oz/week    7 Glasses of wine per week     Comment: occasionally    Review of Systems Constitutional: No fever/chills Eyes: No visual changes. ENT: No sore throat. Cardiovascular: Denies chest pain. Respiratory: Denies shortness of breath. Gastrointestinal: No abdominal pain.   No constipation. Genitourinary: Negative for dysuria. Musculoskeletal: Negative for back pain. Skin: Negative for rash. Neurological: Negative for headaches, focal weakness or numbness.  10-point ROS otherwise negative.  ____________________________________________   PHYSICAL EXAM:  VITAL SIGNS: ED Triage Vitals [01/29/17 1102]  Enc Vitals Group     BP (!) 131/54     Pulse Rate 71     Resp 18     Temp 98.7 F (37.1 C)     Temp Source Oral     SpO2 98 %     Weight 216 lb (98 kg)     Height 5\' 6"  (1.676 m)     Head Circumference      Peak Flow      Pain Score      Pain Loc      Pain Edu?      Excl. in [place]?     Constitutional: Alert and oriented. Well appearing and in no acute distress. Eyes: Conjunctivae are normal. PERRL. EOMI. Head: Atraumatic. Nose: No congestion/rhinnorhea. Mouth/Throat: Mucous membranes are moist.  Neck: No stridor.   Cardiovascular: Normal rate, regular rhythm. Grossly normal heart sounds.  Respiratory: Normal respiratory effort.  No retractions. Lungs CTAB. Gastrointestinal: Soft and nontender. No distention. Grossly brown stool on digital rectal exam which is heme-negative. Musculoskeletal: No lower extremity tenderness nor edema.  No joint effusions. Neurologic:  Normal speech and language. No gross focal neurologic deficits are appreciated.  Skin:  Skin is warm, dry and intact. No rash noted. Psychiatric: Mood and affect are normal. Speech and behavior are normal.  ____________________________________________   LABS (all labs ordered are listed, but only abnormal  results are displayed)  Labs Reviewed  LIPASE, BLOOD - Abnormal; Notable for the following:       Result Value   Lipase 10 (*)    All other components within normal limits  COMPREHENSIVE METABOLIC PANEL - Abnormal; Notable for the following:    Potassium 3.1 (*)    Glucose, Bld 107 (*)    Calcium 8.1 (*)    Total Bilirubin 1.3 (*)    GFR calc non Af Amer 59 (*)    All other components within normal limits  CBC  URINALYSIS, COMPLETE (UACMP) WITH MICROSCOPIC   ____________________________________________  EKG   ____________________________________________  RADIOLOGY   ____________________________________________   PROCEDURES  Procedure(s) performed:   Procedures  Critical Care performed:   ____________________________________________   INITIAL IMPRESSION / ASSESSMENT AND PLAN / ED COURSE  Pertinent labs & imaging results that were available during my care of the patient  were reviewed by me and considered in my medical decision making (see chart for details).  Patient with very reassuring lab results. We'll replete her potassium. Most likely a viral issue. Will be discharged home. To follow-up with her primary care doctor. She had a question about if this can be caused by her vitamin D. However, this medication is more likely to be a cause of constipation. Also, because of recent course of Cipro C. difficile is on the differential. However, the patient has not had any episodes of diarrhea since 3:30 AM. He also has Zofran at home that was prescribed by her primary care doctor which she says helps her symptoms.      ____________________________________________   FINAL CLINICAL IMPRESSION(S) / ED DIAGNOSES  Nause, Vomiting and diarrhea.    NEW MEDICATIONS STARTED DURING THIS VISIT:  New Prescriptions   No medications on file     Note:  This document was prepared using Dragon voice recognition software and may include unintentional dictation errors.      Orbie Pyo, MD 01/29/17 [phone]

## 2017-01-29 NOTE — Telephone Encounter (Signed)
Patient stated stools are loose and black with the consistency of coffe grounds and patient is also vomiting dark emesis. Advised patient o go to ER now.

## 2017-01-29 NOTE — Telephone Encounter (Signed)
Patient was last seen on 01/26/17, she was seen for vomiting and diarrhea. She now has black stools and continues to vomit. She requested a call to discuss further treatment.  Pt contact [phone]

## 2017-01-29 NOTE — ED Triage Notes (Signed)
Pt sent from [facility] for reports of vomiting and diarrhea for six days.

## 2017-02-01 ENCOUNTER — Other Ambulatory Visit: Payer: Medicare HMO

## 2017-02-01 ENCOUNTER — Ambulatory Visit: Payer: Medicare HMO

## 2017-02-16 ENCOUNTER — Ambulatory Visit
Admission: RE | Admit: 2017-02-16 | Discharge: 2017-02-16 | Disposition: A | Discharge status: Home / Self Care | Payer: Medicare HMO | Source: Ambulatory Visit | Attending: Internal Medicine | Admitting: Internal Medicine

## 2017-02-16 DIAGNOSIS — Z13820 Encounter for screening for osteoporosis: Secondary | ICD-10-CM | POA: Diagnosis not present

## 2017-02-16 DIAGNOSIS — Z780 Asymptomatic menopausal state: Secondary | ICD-10-CM | POA: Diagnosis not present

## 2017-02-16 DIAGNOSIS — E2839 Other primary ovarian failure: Secondary | ICD-10-CM

## 2017-02-16 DIAGNOSIS — Z1239 Encounter for other screening for malignant neoplasm of breast: Secondary | ICD-10-CM

## 2017-02-16 DIAGNOSIS — Z1231 Encounter for screening mammogram for malignant neoplasm of breast: Secondary | ICD-10-CM | POA: Diagnosis not present

## 2017-03-25 DIAGNOSIS — L821 Other seborrheic keratosis: Secondary | ICD-10-CM | POA: Diagnosis not present

## 2017-03-25 DIAGNOSIS — Z808 Family history of malignant neoplasm of other organs or systems: Secondary | ICD-10-CM | POA: Diagnosis not present

## 2017-03-25 DIAGNOSIS — L570 Actinic keratosis: Secondary | ICD-10-CM | POA: Diagnosis not present

## 2017-03-25 DIAGNOSIS — D239 Other benign neoplasm of skin, unspecified: Secondary | ICD-10-CM | POA: Diagnosis not present

## 2017-04-20 ENCOUNTER — Other Ambulatory Visit: Payer: Self-pay | Admitting: Internal Medicine

## 2017-05-03 ENCOUNTER — Encounter (INDEPENDENT_AMBULATORY_CARE_PROVIDER_SITE_OTHER): Payer: Self-pay

## 2017-05-03 ENCOUNTER — Ambulatory Visit (INDEPENDENT_AMBULATORY_CARE_PROVIDER_SITE_OTHER): Payer: Medicare HMO | Admitting: Internal Medicine

## 2017-05-03 ENCOUNTER — Encounter: Payer: Self-pay | Admitting: Internal Medicine

## 2017-05-03 ENCOUNTER — Other Ambulatory Visit: Payer: Self-pay

## 2017-05-03 VITALS — BP 118/82 | HR 66 | Temp 98.1°F | Resp 16 | Ht 66.0 in | Wt 221.0 lb

## 2017-05-03 DIAGNOSIS — N9089 Other specified noninflammatory disorders of vulva and perineum: Secondary | ICD-10-CM

## 2017-05-03 DIAGNOSIS — R21 Rash and other nonspecific skin eruption: Secondary | ICD-10-CM

## 2017-05-03 DIAGNOSIS — E876 Hypokalemia: Secondary | ICD-10-CM | POA: Diagnosis not present

## 2017-05-03 DIAGNOSIS — Z8619 Personal history of other infectious and parasitic diseases: Secondary | ICD-10-CM

## 2017-05-03 MED ORDER — ACYCLOVIR 400 MG PO TABS: 400.0000 mg | ORAL_TABLET | Freq: Every day | ORAL | 1 refills | Status: DC

## 2017-05-03 MED ORDER — NYSTATIN-TRIAMCINOLONE 100000-0.1 UNIT/GM-% EX CREA: 1.0000 "application " | TOPICAL_CREAM | Freq: Two times a day (BID) | CUTANEOUS | 2 refills | Status: DC

## 2017-05-03 NOTE — Progress Notes (Signed)
Subjective:  Patient ID: Julie Perez, female    DOB: August 14, 1946  Age: 71 y.o. MRN: [id number]  CC: The primary encounter diagnosis was Hypokalemia. Diagnoses of Rash of genitalia, Labia irritation, and History of candidiasis were also pertinent to this visit.  HPI Julie Perez presents for vaginal irritation and rash.  States that the rash occurred on both labia  3 days ago. No recent intercourse or bike rides.   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  Husband has genital herpes,  Has assumed she gave it to him, and vice versa.     Low potassium noted in march ,  Occurred during ER visit for persistent nausea and vomiting.    Lab Results  Component Value Date   TSH 1.09 01/04/2017       Outpatient Medications Prior to Visit  Medication Sig Dispense Refill  . amLODipine (NORVASC) 2.5 MG tablet TAKE 1 TABLET AT BEDTIME 90 tablet 2  . atorvastatin (LIPITOR) 40 MG tablet TAKE 1 TABLET DAILY AT 6 P.M. 90 tablet 1  . citalopram (CELEXA) 40 MG tablet TAKE 1 TABLET DAILY 90 tablet 1  . conjugated estrogens (PREMARIN) vaginal cream Place 1 Applicatorful vaginally daily. FOR 2 WEEKS,  THEN TWICE WEEKLY THEREAFTER 42.5 g 12  . Cyanocobalamin (VITAMIN B-12) 2500 MCG SUBL Place 1 tablet under the tongue daily. Reported on 05/20/2016    . levothyroxine (SYNTHROID, LEVOTHROID) 112 MCG tablet TAKE ONE TABLET BY MOUTH ONCE DAILY BEFORE BREAKFAST. 90 tablet 1  . ondansetron (ZOFRAN ODT) 4 MG disintegrating tablet Take 1 tablet (4 mg total) by mouth every 8 (eight) hours as needed for nausea or vomiting. 20 tablet 0  . ergocalciferol (DRISDOL) 50000 units capsule Take 1 capsule (50,000 Units total) by mouth once a week. 4 capsule 2  . Cholecalciferol (VITAMIN D3) 1000 UNITS CAPS Take 1 capsule by mouth daily. Reported on 05/20/2016    .  levothyroxine (SYNTHROID, LEVOTHROID) 112 MCG tablet TAKE ONE TABLET BY MOUTH ONCE DAILY BEFORE BREAKFAST. 90 tablet 2  . levothyroxine (SYNTHROID, LEVOTHROID) 112 MCG tablet TAKE 1 TABLET DAILY BEFORE BREAKFAST 90 tablet 0  . levothyroxine (SYNTHROID, LEVOTHROID) 112 MCG tablet TAKE 1 TABLET DAILY BEFORE BREAKFAST 90 tablet 0   No facility-administered medications prior to visit.     Review of Systems;  Patient denies headache, fevers, malaise, unintentional weight loss, skin rash, eye pain, sinus congestion and sinus pain, sore throat, dysphagia,  hemoptysis , cough, dyspnea, wheezing, chest pain, palpitations, orthopnea, edema, abdominal pain, nausea, melena, diarrhea, constipation, flank pain, dysuria, hematuria, urinary  Frequency, nocturia, numbness, tingling, seizures,  Focal weakness, Loss of consciousness,  Tremor, insomnia, depression, anxiety, and suicidal ideation.      Objective:  BP 118/82   Pulse 66   Temp 98.1 F (36.7 C) (Oral)   Resp 16   Ht 5\' 6"  (1.676 m)   Wt 221 lb (100.2 kg)   SpO2 98%   BMI 35.67 kg/m   BP Readings from Last 3 Encounters:  05/03/17 118/82  01/29/17 134/63  01/26/17 122/64    Wt Readings from Last 3 Encounters:  05/03/17 221 lb (100.2 kg)  01/29/17 216 lb (98 kg)  01/26/17 216 lb 12.8 oz (98.3 kg)   General Appearance:    Alert, cooperative, no distress, appears stated age  Head:  Normocephalic, without obvious abnormality, atraumatic  Eyes:    PERRL, conjunctiva/corneas clear, EOM's intact, fundi    benign, both eyes  Ears:    Normal TM's and external ear canals, both ears  Nose:   Nares normal, septum midline, mucosa normal, no drainage    or sinus tenderness  Throat:   Lips, mucosa, and tongue normal; teeth and gums normal  Neck:   Supple, symmetrical, trachea midline, no adenopathy;    thyroid:  no enlargement/tenderness/nodules; no carotid   bruit or JVD     Lungs:     Clear to auscultation bilaterally, respirations  unlabored      Heart:    Regular rate and rhythm, S1 and S2 normal, no murmur, rub   or gallop     Abdomen:     Soft, non-tender, bowel sounds active all four quadrants,    no masses, no organomegaly  Genitalia:    Pelvic: cervix normal in appearance, external genitalia normal, no adnexal masses or tenderness, no cervical motion tenderness, rectovaginal septum normal, uterus normal size, shape, and consistency and vagina normal without discharge                 .  Lab Results  Component Value Date   HGBA1C 5.5 01/04/2017    Lab Results  Component Value Date   CREATININE 0.77 05/03/2017   CREATININE 0.96 01/29/2017   CREATININE 0.72 01/04/2017    Lab Results  Component Value Date   WBC 10.2 01/29/2017   HGB 13.7 01/29/2017   HCT 39.8 01/29/2017   PLT 235 01/29/2017   GLUCOSE 90 05/03/2017   CHOL 185 01/04/2017   TRIG 165.0 (H) 01/04/2017   HDL 53.50 01/04/2017   LDLDIRECT 113.0 05/20/2016   LDLCALC 98 01/04/2017   ALT 25 01/29/2017   AST 25 01/29/2017   NA 139 05/03/2017   K 4.2 05/03/2017   CL 105 05/03/2017   CREATININE 0.77 05/03/2017   BUN 19 05/03/2017   CO2 29 05/03/2017   TSH 1.09 01/04/2017   HGBA1C 5.5 01/04/2017    Dg Bone Density  Result Date: 02/16/2017 EXAM: DUAL X-RAY ABSORPTIOMETRY (DXA) FOR BONE MINERAL DENSITY IMPRESSION: Referring Physician:  Crecencio Mc PATIENT: Name: Julie Perez, Julie Perez Patient ID: [id number] Birth Date: 10/26/1946 Height: 66.0 in. Sex: Female Measured: 02/16/2017 Weight: 194.8 lbs. Indications: Caucasian, Estrogen Deficient, Hypothyroid, Hysterectomy, Levothyroxine, Postmenopausal Fractures: None Treatments: Premarin, Vitamin D (E933.5) ASSESSMENT: The BMD measured at Femur Neck Left is 1.072 g/cm2 with a T-score of 0.2. This patient is considered normal according to [facility]) criteria. Site Region Measured Date Measured Age [age]A T-score BMD Significant CHANGE DualFemur Neck Left 02/16/2017 69.6 0[age]2 1.072  g/cm2 AP Spine L1-L4 02/16/2017 69.6 1.6 1.381 g/cm2 World Health Organization [facility]) criteria for post-menopausal, Caucasian Women: Normal T-score at or above -1 SD Osteopenia T-score between -1 and -2.5 SD Osteoporosis T-score at or below -2.5 SD RECOMMENDATION: [place] recommends that FDA-approved medical therapies be considered in postmenopausal women and men age 5 or older with a: 1. Hip or vertebral (clinical or morphometric) fracture. 2. T-score of <-2.5 at the spine or hip. 3. Ten-year fracture probability by FRAX of 3% or greater for hip fracture or 20% or greater for major osteoporotic fracture. All treatment decisions require clinical judgment and consideration of individual patient factors, including patient preferences, co-morbidities, previous drug use, risk factors not captured in the FRAX model (e.g. falls, vitamin D deficiency, increased bone turnover, interval significant decline in  bone density) and possible under - or over-estimation of fracture risk by FRAX. All patients should ensure an adequate intake of dietary calcium (1200 mg/d) and vitamin D (800 IU daily) unless contraindicated. FOLLOW-UP: People with diagnosed cases of osteoporosis or at high risk for fracture should have regular bone mineral density tests. For patients eligible for Medicare, routine testing is allowed once every 2 years. The testing frequency can be increased to one year for patients who have rapidly progressing disease, those who are receiving or discontinuing medical therapy to restore bone mass, or have additional risk factors. I have reviewed this report, and agree with the above findings. [facility] Electronically Signed   By: Kerby Moors M.D.   On: 02/16/2017 15:41   Mm Screening Breast Tomo Bilateral  Result Date: 02/17/2017 CLINICAL DATA:  Screening. EXAM: 2D DIGITAL SCREENING BILATERAL MAMMOGRAM WITH CAD AND ADJUNCT TOMO COMPARISON:  Previous exam(s). ACR Breast Density  Category b: There are scattered areas of fibroglandular density. FINDINGS: There are no findings suspicious for malignancy. Images were processed with CAD. IMPRESSION: No mammographic evidence of malignancy. A result letter of this screening mammogram will be mailed directly to the patient. RECOMMENDATION: Screening mammogram in one year. (Code:SM-B-01Y) BI-RADS CATEGORY  1: Negative. Electronically Signed   By: Franki Cabot M.D.   On: 02/17/2017 17:02    Assessment & Plan:   Problem List Items Addressed This Visit    Labia irritation    She has no signs of candida or genital herpes on exam.  Explained the difference I presentation between shingles and herpes . Acyclovir given.Marland Kitchen  Herpes ab titers ordered .         Hypokalemia - Primary    Secondary to gi illness.  Normal along with mg level on recheck today  Lab Results  Component Value Date   NA 139 05/03/2017   K 4.2 05/03/2017   CL 105 05/03/2017   CO2 29 05/03/2017   No results found for: MICROALBUR, [id number]        Relevant Orders   Basic metabolic panel (Completed)   Magnesium (Completed)   History of candidiasis    Refilled nystatin,  Advised to use gold bond powder with zinc under pannus and intertrigonal areas.        Other Visit Diagnoses    Rash of genitalia       Relevant Orders   HSV(herpes simplex vrs) 1+2 ab-IgG      I have discontinued Ms. Lackie's ergocalciferol. I am also having her start on acyclovir. Additionally, I am having her maintain her Vitamin B-12, levothyroxine, amLODipine, atorvastatin, citalopram, conjugated estrogens, ondansetron, and Vitamin D3.  Meds ordered this encounter  Medications  . Cholecalciferol (VITAMIN D3) 1000 units CAPS    Sig: Take 1 capsule by mouth.  . DISCONTD: nystatin-triamcinolone (MYCOLOG II) cream    Sig: Apply 1 application topically 2 (two) times daily.  Marland Kitchen acyclovir (ZOVIRAX) 400 MG tablet    Sig: Take 1 tablet (400 mg total) by mouth 5 (five) times daily.     Dispense:  35 tablet    Refill:  1    Medications Discontinued During This Encounter  Medication Reason  . Cholecalciferol (VITAMIN D3) 1000 UNITS CAPS Patient Discharge  . levothyroxine (SYNTHROID, LEVOTHROID) 401 MCG tablet Duplicate  . levothyroxine (SYNTHROID, LEVOTHROID) 027 MCG tablet Duplicate  . levothyroxine (SYNTHROID, LEVOTHROID) 253 MCG tablet Duplicate  . ergocalciferol (DRISDOL) 50000 units capsule Patient Discharge   A total of 25 minutes of face  to face time was spent with patient more than half of which was spent in counselling and coordination of care   Follow-up: No Follow-up on file.   Crecencio Mc, MD

## 2017-05-03 NOTE — Patient Instructions (Signed)
I see no signs of a recent herpes infection on your exam today  The blood test will tell us if you have  Been infected in the past .  Save the acyclovir for the next outbreak: it will treat both shingles and herpes   Try using Gold Bond powder with Zinc in any skin creases that are prone to developing any itchy rash   DO NOT USE IN OR NEAR THE VAGINA

## 2017-05-04 DIAGNOSIS — B009 Herpesviral infection, unspecified: Secondary | ICD-10-CM | POA: Insufficient documentation

## 2017-05-04 DIAGNOSIS — E876 Hypokalemia: Secondary | ICD-10-CM | POA: Insufficient documentation

## 2017-05-04 LAB — BASIC METABOLIC PANEL
BUN: 19 mg/dL (ref 6–23)
CALCIUM: 9.4 mg/dL (ref 8.4–10.5)
CO2: 29 meq/L (ref 19–32)
CREATININE: 0.77 mg/dL (ref 0.40–1.20)
Chloride: 105 mEq/L (ref 96–112)
GFR: 78.79 mL/min (ref >60.00)
Glucose, Bld: 90 mg/dL (ref 70–99)
Potassium: 4.2 mEq/L (ref 3.5–5.1)
SODIUM: 139 meq/L (ref 135–145)

## 2017-05-04 LAB — MAGNESIUM: MAGNESIUM: 2 mg/dL (ref 1.5–2.5)

## 2017-05-04 NOTE — Assessment & Plan Note (Signed)
Refilled nystatin,  Advised to use gold bond powder with zinc under pannus and intertrigonal areas.

## 2017-05-04 NOTE — Assessment & Plan Note (Addendum)
Secondary to gi illness.  Normal along with mg level on recheck today  Lab Results  Component Value Date   NA 139 05/03/2017   K 4.2 05/03/2017   CL 105 05/03/2017   CO2 29 05/03/2017   No results found for: Derl Barrow

## 2017-05-04 NOTE — Assessment & Plan Note (Signed)
She has no signs of candida or genital herpes on exam.  Explained the difference I presentation between shingles and herpes . Acyclovir given.Marland Kitchen  Herpes ab titers ordered .

## 2017-05-05 LAB — HSV(HERPES SIMPLEX VRS) I + II AB-IGG: HSV 2 Glycoprotein G Ab, IgG: 3.63 Index — ABNORMAL HIGH (ref <0.90)

## 2017-05-20 ENCOUNTER — Ambulatory Visit (INDEPENDENT_AMBULATORY_CARE_PROVIDER_SITE_OTHER): Payer: Medicare HMO

## 2017-05-20 VITALS — BP 120/80 | HR 60 | Temp 98.3°F | Resp 18 | Ht 66.0 in | Wt 217.2 lb

## 2017-05-20 DIAGNOSIS — Z0000 Encounter for general adult medical examination without abnormal findings: Secondary | ICD-10-CM

## 2017-05-20 DIAGNOSIS — Z23 Encounter for immunization: Secondary | ICD-10-CM

## 2017-05-20 DIAGNOSIS — Z1389 Encounter for screening for other disorder: Secondary | ICD-10-CM | POA: Diagnosis not present

## 2017-05-20 DIAGNOSIS — Z1331 Encounter for screening for depression: Secondary | ICD-10-CM

## 2017-05-20 NOTE — Progress Notes (Signed)
Subjective:   Julie Perez is a 71 y.o. female who presents for Medicare Annual (Subsequent) preventive examination.  Review of Systems:  No ROS.  Medicare Wellness Visit. Additional risk factors are reflected in the social history.  Cardiac Risk Factors include: advanced age (>40men, >26 women);hypertension;obesity (BMI >30kg/m2)     Objective:     Vitals: BP 120/80   Pulse 60   Temp 98.3 F (36.8 C) (Oral)   Resp 18   Ht 5\' 6"  (1.676 m)   Wt 217 lb 4 oz (98.5 kg)   SpO2 96%   BMI 35.07 kg/m   Body mass index is 35.07 kg/m.   Tobacco History  Smoking Status  . Never Smoker  Smokeless Tobacco  . Never Used     Counseling given: Not Answered   Past Medical History:  Diagnosis Date  . Frequent headaches    stress  related  . Hyperlipidemia   . Migraines   . Thyroid disease    Past Surgical History:  Procedure Laterality Date  . ABDOMINAL HYSTERECTOMY  1979   Family History  Problem Relation Age of Onset  . Hypertension Mother   . Alzheimer's disease Mother   . Cancer Father 99       prostate cancer  . Cancer Sister 29       breast cancer  . Diabetes Brother        type 2  . Heart disease Sister        cabg x 4, tobacco abuse  . Diabetes Sister        type 2  . Aneurysm Sister    History  Sexual Activity  . Sexual activity: No    Outpatient Encounter Prescriptions as of 05/20/2017  Medication Sig  . acyclovir (ZOVIRAX) 400 MG tablet Take 1 tablet (400 mg total) by mouth 5 (five) times daily.  Marland Kitchen amLODipine (NORVASC) 2.5 MG tablet TAKE 1 TABLET AT BEDTIME  . atorvastatin (LIPITOR) 40 MG tablet TAKE 1 TABLET DAILY AT 6 P.M.  . Cholecalciferol (VITAMIN D3) 1000 units CAPS Take 1 capsule by mouth.  . citalopram (CELEXA) 40 MG tablet TAKE 1 TABLET DAILY  . conjugated estrogens (PREMARIN) vaginal cream Place 1 Applicatorful vaginally daily. FOR 2 WEEKS,  THEN TWICE WEEKLY THEREAFTER  . Cyanocobalamin (VITAMIN B-12) 2500 MCG SUBL Place 1 tablet  under the tongue daily. Reported on 05/20/2016  . levothyroxine (SYNTHROID, LEVOTHROID) 112 MCG tablet TAKE ONE TABLET BY MOUTH ONCE DAILY BEFORE BREAKFAST.  Marland Kitchen nystatin-triamcinolone (MYCOLOG II) cream Apply 1 application topically 2 (two) times daily.  . ondansetron (ZOFRAN ODT) 4 MG disintegrating tablet Take 1 tablet (4 mg total) by mouth every 8 (eight) hours as needed for nausea or vomiting.   No facility-administered encounter medications on file as of 05/20/2017.     Activities of Daily Living In your present state of health, do you have any difficulty performing the following activities: 05/20/2017  Hearing? N  Vision? N  Difficulty concentrating or making decisions? N  Walking or climbing stairs? N  Dressing or bathing? N  Doing errands, shopping? N  Preparing Food and eating ? N  Using the Toilet? N  In the past six months, have you accidently leaked urine? N  Do you have problems with loss of bowel control? N  Managing your Medications? N  Managing your Finances? N  Housekeeping or managing your Housekeeping? N  Some recent data might be hidden    Patient Care Team: Deborra Medina  L, MD as PCP - General (Internal Medicine)    Assessment:    This is a routine wellness examination for [facility]. The goal of the wellness visit is to assist the patient how to close the gaps in care and create a preventative care plan for the patient.   The roster of all physicians providing medical care to patient is listed in the Snapshot section of the chart.  Taking calcium VIT D as appropriate/Osteoporosis risk reviewed.    Safety issues reviewed; Smoke and carbon monoxide detectors in the home. No firearms in the home.  Wears seatbelts when driving or riding with others. Patient does wear sunscreen or protective clothing when in direct sunlight. No violence in the home.  Depression- PHQ 2 &9 complete.  No signs/symptoms or verbal communication regarding little pleasure in doing things,  feeling down, depressed or hopeless. No changes in sleeping, energy, eating, concentrating.  No thoughts of self harm or harm towards others.  Time spent on this topic is 8 minutes.   Patient is alert, normal appearance, oriented to person/place/and time.  Correctly identified the president of the [place], recall of 3/3 words, and performing simple calculations. Displays appropriate judgement and can read correct time from watch face.   No new identified risk were noted.  No failures at ADL's or IADL's.    BMI- discussed the importance of a healthy diet, water intake and the benefits of aerobic exercise. Educational material provided.   Daily fluid intake: 2 cups of caffeine, 6 cups of water  Dental- every 6 months. ([place])  Eye- Visual acuity not assessed per patient preference since they have regular follow up with the ophthalmologist.  Wears corrective lenses.  Sleep patterns- Sleeps 5-6 hours at night.  Wakes feeling rested. Naps during the day.  Prevnar 13 vaccine administered R deltod, tolerated well. Educational material provided.  Health maintenance gaps- closed.  Patient Concerns: None at this time. Follow up with PCP as needed. Exercise Activities and Dietary recommendations Current Exercise Habits: Home exercise routine, Time (Minutes): 20, Frequency (Times/Week): 4, Weekly Exercise (Minutes/Week): 80, Intensity: Moderate  Goals    . Increase lean proteins          Low carb foods    . Increase physical activity      Fall Risk Fall Risk  05/20/2017 07/22/2016 11/28/2014  Falls in the past year? No No No   Depression Screen PHQ 2/9 Scores 05/20/2017 01/04/2017 11/28/2014  PHQ - 2 Score 0 0 0     Cognitive Function MMSE - Mini Mental State Exam 05/20/2017  Orientation to time 5  Orientation to Place 5  Registration 3  Attention/ Calculation 5  Recall 3  Language- name 2 objects 2  Language- repeat 1  Language- follow 3 step command 3  Language- read & follow  direction 1  Write a sentence 1  Copy design 1  Total score 30        Immunization History  Administered Date(s) Administered  . Influenza,inj,Quad PF,36+ Mos 09/12/2014, 07/17/2015  . Influenza-Unspecified 08/23/2016  . Pneumococcal Conjugate-13 05/20/2017  . Pneumococcal Polysaccharide-23 06/20/2013  . Tdap 05/20/2016  . Zoster 07/06/2014   Screening Tests Health Maintenance  Topic Date Due  . PNA vac Low Risk Adult (2 of 2 - PCV13) 06/20/2014  . INFLUENZA VACCINE  06/23/2017  . MAMMOGRAM  02/17/2019  . COLONOSCOPY  06/20/2021  . TETANUS/TDAP  05/20/2026  . DEXA SCAN  Completed  . Hepatitis C Screening  Completed  Plan:    End of life planning; Advance aging; Advanced directives discussed. Copy of current HCPOA/Living Will requested.    I have personally reviewed and noted the following in the patient's chart:   . Medical and social history . Use of alcohol, tobacco or illicit drugs  . Current medications and supplements . Functional ability and status . Nutritional status . Physical activity . Advanced directives . List of other physicians . Hospitalizations, surgeries, and ER visits in previous 12 months . Vitals . Screenings to include cognitive, depression, and falls . Referrals and appointments  In addition, I have reviewed and discussed with patient certain preventive protocols, quality metrics, and best practice recommendations. A written personalized care plan for preventive services as well as general preventive health recommendations were provided to patient.     Varney Biles, LPN  3/[id number]

## 2017-05-20 NOTE — Progress Notes (Signed)
Care was provided under my supervision. I agree with the management as indicated in the note.  Damyan Corne DO  

## 2017-05-20 NOTE — Patient Instructions (Addendum)
  Julie Perez , Thank you for taking time to come for your Medicare Wellness Visit. I appreciate your ongoing commitment to your health goals. Please review the following plan we discussed and let me know if I can assist you in the future.   Follow up with Dr. Derrel Nip as needed.    Bring a copy of your [place] and/or Living Will to be scanned into chart.  Have a great day!  These are the goals we discussed: Goals    . Increase lean proteins          Low carb foods    . Increase physical activity       This is a list of the screening recommended for you and due dates:  Health Maintenance  Topic Date Due  . Pneumonia vaccines (2 of 2 - PCV13) 06/20/2014  . Flu Shot  06/23/2017  . Mammogram  02/17/2019  . Colon Cancer Screening  06/20/2021  . Tetanus Vaccine  05/20/2026  . DEXA scan (bone density measurement)  Completed  .  Hepatitis C: One time screening is recommended by Center for Disease Control  (CDC) for  adults born from [birth year] through 1964.   Completed

## 2017-05-26 ENCOUNTER — Other Ambulatory Visit: Payer: Self-pay | Admitting: Internal Medicine

## 2017-05-27 DIAGNOSIS — M5416 Radiculopathy, lumbar region: Secondary | ICD-10-CM | POA: Diagnosis not present

## 2017-06-24 ENCOUNTER — Other Ambulatory Visit: Payer: Self-pay | Admitting: Internal Medicine

## 2017-07-05 ENCOUNTER — Encounter: Payer: Self-pay | Admitting: Internal Medicine

## 2017-07-05 ENCOUNTER — Ambulatory Visit (INDEPENDENT_AMBULATORY_CARE_PROVIDER_SITE_OTHER): Payer: Medicare HMO | Admitting: Internal Medicine

## 2017-07-05 VITALS — BP 128/70 | HR 57 | Temp 98.2°F | Resp 15 | Ht 66.0 in | Wt 219.2 lb

## 2017-07-05 DIAGNOSIS — E890 Postprocedural hypothyroidism: Secondary | ICD-10-CM | POA: Diagnosis not present

## 2017-07-05 DIAGNOSIS — M545 Low back pain: Secondary | ICD-10-CM | POA: Diagnosis not present

## 2017-07-05 DIAGNOSIS — B009 Herpesviral infection, unspecified: Secondary | ICD-10-CM | POA: Diagnosis not present

## 2017-07-05 DIAGNOSIS — R69 Illness, unspecified: Secondary | ICD-10-CM | POA: Diagnosis not present

## 2017-07-05 DIAGNOSIS — G8929 Other chronic pain: Secondary | ICD-10-CM

## 2017-07-05 DIAGNOSIS — Z79899 Other long term (current) drug therapy: Secondary | ICD-10-CM

## 2017-07-05 DIAGNOSIS — I10 Essential (primary) hypertension: Secondary | ICD-10-CM | POA: Diagnosis not present

## 2017-07-05 DIAGNOSIS — M5416 Radiculopathy, lumbar region: Secondary | ICD-10-CM | POA: Insufficient documentation

## 2017-07-05 DIAGNOSIS — M47816 Spondylosis without myelopathy or radiculopathy, lumbar region: Secondary | ICD-10-CM

## 2017-07-05 DIAGNOSIS — F411 Generalized anxiety disorder: Secondary | ICD-10-CM | POA: Diagnosis not present

## 2017-07-05 DIAGNOSIS — Z0000 Encounter for general adult medical examination without abnormal findings: Secondary | ICD-10-CM | POA: Insufficient documentation

## 2017-07-05 DIAGNOSIS — E876 Hypokalemia: Secondary | ICD-10-CM | POA: Diagnosis not present

## 2017-07-05 DIAGNOSIS — E785 Hyperlipidemia, unspecified: Secondary | ICD-10-CM | POA: Diagnosis not present

## 2017-07-05 LAB — COMPREHENSIVE METABOLIC PANEL
ALK PHOS: 52 U/L (ref 39–117)
ALT: 14 U/L (ref 0–35)
AST: 14 U/L (ref 0–37)
Albumin: 4.5 g/dL (ref 3.5–5.2)
BILIRUBIN TOTAL: 0.8 mg/dL (ref 0.2–1.2)
BUN: 18 mg/dL (ref 6–23)
CO2: 29 mEq/L (ref 19–32)
CREATININE: 0.65 mg/dL (ref 0.40–1.20)
Calcium: 8.9 mg/dL (ref 8.4–10.5)
Chloride: 104 mEq/L (ref 96–112)
GFR: 95.75 mL/min (ref >60.00)
GLUCOSE: 100 mg/dL — AB (ref 70–99)
Potassium: 4.2 mEq/L (ref 3.5–5.1)
SODIUM: 140 meq/L (ref 135–145)
Total Protein: 6.7 g/dL (ref 6.0–8.3)

## 2017-07-05 LAB — TSH: TSH: 2.04 u[IU]/mL (ref 0.35–4.50)

## 2017-07-05 MED ORDER — CIPROFLOXACIN HCL 250 MG PO TABS: 250.0000 mg | ORAL_TABLET | Freq: Two times a day (BID) | ORAL | 0 refills | Status: DC

## 2017-07-05 MED ORDER — SCOPOLAMINE 1 MG/3DAYS TD PT72: 1.0000 | MEDICATED_PATCH | TRANSDERMAL | 0 refills | Status: DC

## 2017-07-05 MED ORDER — MELOXICAM 15 MG PO TABS: 15.0000 mg | ORAL_TABLET | Freq: Every day | ORAL | 3 refills | Status: DC

## 2017-07-05 MED ORDER — ACYCLOVIR 400 MG PO TABS: 400.0000 mg | ORAL_TABLET | Freq: Every day | ORAL | 11 refills | Status: DC

## 2017-07-05 NOTE — Assessment & Plan Note (Signed)
Managed with citalopram.

## 2017-07-05 NOTE — Patient Instructions (Addendum)
Happy Birthday!  I sent cipro and scopalamine patches to your pharmacy to take with you on your cruise for 1) travelers diarrhea and 2) seasickness (hoping you have neither)  For your back pain due to degenerative disk disease:  1) You can take the meloxicam once daily  Or as needed  For back pain .  DO NOT TAKE ibuprofen, motrin, aleve, naproxen, or any orther NSAID or aspirin product while you are on this except a baby aspirin (81 mg) if you are already doing so..  You CAN take tylenol ,  Up to 2000 mg daily with the melxoicam.  This will help the pain   Protect  Your stomach  From getting an ulcer with omeprazole or nexium if you are gong to take it daily    Health Maintenance for Postmenopausal Women Menopause is a normal process in which your reproductive ability comes to an end. This process happens gradually over a span of months to years, usually between the ages of 58 and 58. Menopause is complete when you have missed 12 consecutive menstrual periods. It is important to talk with your health care provider about some of the most common conditions that affect postmenopausal women, such as heart disease, cancer, and bone loss (osteoporosis). Adopting a healthy lifestyle and getting preventive care can help to promote your health and wellness. Those actions can also lower your chances of developing some of these common conditions. What should I know about menopause? During menopause, you may experience a number of symptoms, such as:  Moderate-to-severe hot flashes.  Night sweats.  Decrease in sex drive.  Mood swings.  Headaches.  Tiredness.  Irritability.  Memory problems.  Insomnia.  Choosing to treat or not to treat menopausal changes is an individual decision that you make with your health care provider. What should I know about hormone replacement therapy and supplements? Hormone therapy products are effective for treating symptoms that are associated with menopause,  such as hot flashes and night sweats. Hormone replacement carries certain risks, especially as you become older. If you are thinking about using estrogen or estrogen with progestin treatments, discuss the benefits and risks with your health care provider. What should I know about heart disease and stroke? Heart disease, heart attack, and stroke become more likely as you age. This may be due, in part, to the hormonal changes that your body experiences during menopause. These can affect how your body processes dietary fats, triglycerides, and cholesterol. Heart attack and stroke are both medical emergencies. There are many things that you can do to help prevent heart disease and stroke:  Have your blood pressure checked at least every 1-2 years. High blood pressure causes heart disease and increases the risk of stroke.  If you are 62-30 years old, ask your health care provider if you should take aspirin to prevent a heart attack or a stroke.  Do not use any tobacco products, including cigarettes, chewing tobacco, or electronic cigarettes. If you need help quitting, ask your health care provider.  It is important to eat a healthy diet and maintain a healthy weight. ? Be sure to include plenty of vegetables, fruits, low-fat dairy products, and lean protein. ? Avoid eating foods that are high in solid fats, added sugars, or salt (sodium).  Get regular exercise. This is one of the most important things that you can do for your health. ? Try to exercise for at least 150 minutes each week. The type of exercise that you do should  increase your heart rate and make you sweat. This is known as moderate-intensity exercise. ? Try to do strengthening exercises at least twice each week. Do these in addition to the moderate-intensity exercise.  Know your numbers.Ask your health care provider to check your cholesterol and your blood glucose. Continue to have your blood tested as directed by your health care  provider.  What should I know about cancer screening? There are several types of cancer. Take the following steps to reduce your risk and to catch any cancer development as early as possible. Breast Cancer  Practice breast self-awareness. ? This means understanding how your breasts normally appear and feel. ? It also means doing regular breast self-exams. Let your health care provider know about any changes, no matter how small.  If you are 40 or older, have a clinician do a breast exam (clinical breast exam or CBE) every year. Depending on your age, family history, and medical history, it may be recommended that you also have a yearly breast X-ray (mammogram).  If you have a family history of breast cancer, talk with your health care provider about genetic screening.  If you are at high risk for breast cancer, talk with your health care provider about having an MRI and a mammogram every year.  Breast cancer (BRCA) gene test is recommended for women who have family members with BRCA-related cancers. Results of the assessment will determine the need for genetic counseling and BRCA1 and for BRCA2 testing. BRCA-related cancers include these types: ? Breast. This occurs in males or females. ? Ovarian. ? Tubal. This may also be called fallopian tube cancer. ? Cancer of the abdominal or pelvic lining (peritoneal cancer). ? Prostate. ? Pancreatic.  Cervical, Uterine, and Ovarian Cancer Your health care provider may recommend that you be screened regularly for cancer of the pelvic organs. These include your ovaries, uterus, and vagina. This screening involves a pelvic exam, which includes checking for microscopic changes to the surface of your cervix (Pap test).  For women ages 21-65, health care providers may recommend a pelvic exam and a Pap test every three years. For women ages 13-65, they may recommend the Pap test and pelvic exam, combined with testing for human papilloma virus (HPV), every  five years. Some types of HPV increase your risk of cervical cancer. Testing for HPV may also be done on women of any age who have unclear Pap test results.  Other health care providers may not recommend any screening for nonpregnant women who are considered low risk for pelvic cancer and have no symptoms. Ask your health care provider if a screening pelvic exam is right for you.  If you have had past treatment for cervical cancer or a condition that could lead to cancer, you need Pap tests and screening for cancer for at least 20 years after your treatment. If Pap tests have been discontinued for you, your risk factors (such as having a new sexual partner) need to be reassessed to determine if you should start having screenings again. Some women have medical problems that increase the chance of getting cervical cancer. In these cases, your health care provider may recommend that you have screening and Pap tests more often.  If you have a family history of uterine cancer or ovarian cancer, talk with your health care provider about genetic screening.  If you have vaginal bleeding after reaching menopause, tell your health care provider.  There are currently no reliable tests available to screen for ovarian cancer.  Lung Cancer Lung cancer screening is recommended for adults 46-33 years old who are at high risk for lung cancer because of a history of smoking. A yearly low-dose CT scan of the lungs is recommended if you:  Currently smoke.  Have a history of at least 30 pack-years of smoking and you currently smoke or have quit within the past 15 years. A pack-year is smoking an average of one pack of cigarettes per day for one year.  Yearly screening should:  Continue until it has been 15 years since you quit.  Stop if you develop a health problem that would prevent you from having lung cancer treatment.  Colorectal Cancer  This type of cancer can be detected and can often be  prevented.  Routine colorectal cancer screening usually begins at age 47 and continues through age 1.  If you have risk factors for colon cancer, your health care provider may recommend that you be screened at an earlier age.  If you have a family history of colorectal cancer, talk with your health care provider about genetic screening.  Your health care provider may also recommend using home test kits to check for hidden blood in your stool.  A small camera at the end of a tube can be used to examine your colon directly (sigmoidoscopy or colonoscopy). This is done to check for the earliest forms of colorectal cancer.  Direct examination of the colon should be repeated every 5-10 years until age 42. However, if early forms of precancerous polyps or small growths are found or if you have a family history or genetic risk for colorectal cancer, you may need to be screened more often.  Skin Cancer  Check your skin from head to toe regularly.  Monitor any moles. Be sure to tell your health care provider: ? About any new moles or changes in moles, especially if there is a change in a mole's shape or color. ? If you have a mole that is larger than the size of a pencil eraser.  If any of your family members has a history of skin cancer, especially at a young age, talk with your health care provider about genetic screening.  Always use sunscreen. Apply sunscreen liberally and repeatedly throughout the day.  Whenever you are outside, protect yourself by wearing long sleeves, pants, a wide-brimmed hat, and sunglasses.  What should I know about osteoporosis? Osteoporosis is a condition in which bone destruction happens more quickly than new bone creation. After menopause, you may be at an increased risk for osteoporosis. To help prevent osteoporosis or the bone fractures that can happen because of osteoporosis, the following is recommended:  If you are 59-55 years old, get at least 1,000 mg of  calcium and at least 600 mg of vitamin D per day.  If you are older than age 19 but younger than age 38, get at least 1,200 mg of calcium and at least 600 mg of vitamin D per day.  If you are older than age 79, get at least 1,200 mg of calcium and at least 800 mg of vitamin D per day.  Smoking and excessive alcohol intake increase the risk of osteoporosis. Eat foods that are rich in calcium and vitamin D, and do weight-bearing exercises several times each week as directed by your health care provider. What should I know about how menopause affects my mental health? Depression may occur at any age, but it is more common as you become older. Common symptoms of  include:  Low or sad mood.  Changes in sleep patterns.  Changes in appetite or eating patterns.  Feeling an overall lack of motivation or enjoyment of activities that you previously enjoyed.  Frequent crying spells.  Talk with your health care provider if you think that you are experiencing depression. What should I know about immunizations? It is important that you get and maintain your immunizations. These include:  Tetanus, diphtheria, and pertussis (Tdap) booster vaccine.  Influenza every year before the flu season begins.  Pneumonia vaccine.  Shingles vaccine.  Your health care provider may also recommend other immunizations. This information is not intended to replace advice given to you by your health care provider. Make sure you discuss any questions you have with your health care provider. Document Released: 01/01/2006 Document Revised: 05/29/2016 Document Reviewed: 08/13/2015 Elsevier Interactive Patient Education  2018 [employer]  

## 2017-07-05 NOTE — Progress Notes (Signed)
Patient ID: Julie Perez, female    DOB: Aug 20, 1946  Age: 71 y.o. MRN: [id number]  The patient is here for annual wellness examination and management of other chronic and acute problems.  Mammogram normal March  2018 Colonoscopy 2012 normal  By Julie Perez    The risk factors are reflected in the social history.  The roster of all physicians providing medical care to patient - is listed in the Snapshot section of the chart.  Activities of daily living:  The patient is 100% independent in all ADLs: dressing, toileting, feeding as well as independent mobility  Home safety : The patient has smoke detectors in the home. They wear seatbelts.  There are no firearms at home. There is no violence in the home.   There is no risks for hepatitis, STDs or HIV. There is no   history of blood transfusion. They have no travel history to infectious disease endemic areas of the world.  The patient has seen their dentist in the last six month. They have seen their eye doctor in the last year. They admit to slight hearing difficulty with regard to whispered voices and some television programs.  They have deferred audiologic testing in the last year.  They do not  have excessive sun exposure. Discussed the need for sun protection: hats, long sleeves and use of sunscreen if there is significant sun exposure.   Diet: the importance of a healthy diet is discussed. They do have a healthy diet.  The benefits of regular aerobic exercise were discussed. She walks 4 times per week ,  20 minutes.   Depression screen: there are no signs or vegative symptoms of depression- irritability, change in appetite, anhedonia, sadness/tearfullness.  Cognitive assessment: the patient manages all their financial and personal affairs and is actively engaged. They could relate day,date,year and events; recalled 2/3 objects at 3 minutes; performed clock-face test normally.  The following portions of the patient's history were  reviewed and updated as appropriate: allergies, current medications, past family history, past medical history,  past surgical history, past social history  and problem list.  Visual acuity was not assessed per patient preference since she has regular follow up with her ophthalmologist. Hearing and body mass index were assessed and reviewed.   During the course of the visit the patient was educated and counseled about appropriate screening and preventive services including : fall prevention , diabetes screening, nutrition counseling, colorectal cancer screening, and recommended immunizations.    CC: The primary encounter diagnosis was Essential hypertension. Diagnoses of Hypokalemia, Hypothyroidism, postradioiodine therapy, Chronic right-sided low back pain without sciatica, Long-term use of high-risk medication, HSV-2 (herpes simplex virus 2) infection, Encounter for preventive health examination, Spondylosis of lumbar region without myelopathy or radiculopathy, Generalized anxiety disorder, and Hyperlipidemia LDL goal <130 were also pertinent to this visit.  Saw Ortho in July for right sided back pain  in July,  Was taking Aleve,  Robaxin and prednisone dose pack given no improvement .  Deferred PT due to cost of $244  Plus 10% each visit  Reporting pain radiating to right knee but not below.  Aggravated by duties cleaning houses. Has tried husband's mobic which worked     History Julie Perez has a past medical history of Frequent headaches; Hyperlipidemia; Migraines; and Thyroid disease.   She has a past surgical history that includes Abdominal hysterectomy (1979).   Her family history includes Alzheimer's disease in her mother; Aneurysm in her sister; Cancer (age of onset: 34) in  her father; Cancer (age of onset: 32) in her sister; Diabetes in her brother and sister; Heart disease in her sister; Hypertension in her mother.She reports that she has never smoked. She has never used smokeless tobacco. She  reports that she drinks about 4.2 oz of alcohol per week . She reports that she does not use drugs.  Outpatient Medications Prior to Visit  Medication Sig Dispense Refill  . amLODipine (NORVASC) 2.5 MG tablet TAKE 1 TABLET AT BEDTIME 90 tablet 2  . atorvastatin (LIPITOR) 40 MG tablet TAKE 1 TABLET DAILY AT 6 P.M. 90 tablet 1  . Cholecalciferol (VITAMIN D3) 1000 units CAPS Take 1 capsule by mouth.    . citalopram (CELEXA) 40 MG tablet TAKE 1 TABLET DAILY 90 tablet 1  . conjugated estrogens (PREMARIN) vaginal cream Place 1 Applicatorful vaginally daily. FOR 2 WEEKS,  THEN TWICE WEEKLY THEREAFTER 42.5 g 12  . Cyanocobalamin (VITAMIN B-12) 2500 MCG SUBL Place 1 tablet under the tongue daily. Reported on 05/20/2016    . levothyroxine (SYNTHROID, LEVOTHROID) 112 MCG tablet TAKE ONE TABLET BY MOUTH ONCE DAILY BEFORE BREAKFAST. 90 tablet 1  . nystatin-triamcinolone (MYCOLOG II) cream Apply 1 application topically 2 (two) times daily. 30 g 2  . ondansetron (ZOFRAN ODT) 4 MG disintegrating tablet Take 1 tablet (4 mg total) by mouth every 8 (eight) hours as needed for nausea or vomiting. 20 tablet 0  . acyclovir (ZOVIRAX) 400 MG tablet Take 1 tablet (400 mg total) by mouth 5 (five) times daily. 35 tablet 1   No facility-administered medications prior to visit.     Review of Systems   Patient denies headache, fevers, malaise, unintentional weight loss, skin rash, eye pain, sinus congestion and sinus pain, sore throat, dysphagia,  hemoptysis , cough, dyspnea, wheezing, chest pain, palpitations, orthopnea, edema, abdominal pain, nausea, melena, diarrhea, constipation, flank pain, dysuria, hematuria, urinary  Frequency, nocturia, numbness, tingling, seizures,  Focal weakness, Loss of consciousness,  Tremor, insomnia, depression, anxiety, and suicidal ideation.      Objective:  BP 128/70 (BP Location: Left Arm, Patient Position: Sitting, Cuff Size: Normal)   Pulse (!) 57   Temp 98.2 F (36.8 C) (Oral)    Resp 15   Ht 5\' 6"  (1.676 m)   Wt 219 lb 3.2 oz (99.4 kg)   SpO2 97%   BMI 35.38 kg/m   Physical Exam   General appearance: alert, cooperative and appears stated age Head: Normocephalic, without obvious abnormality, atraumatic Eyes: conjunctivae/corneas clear. PERRL, EOM's intact. Fundi benign. Ears: normal TM's and external ear canals both ears Nose: Nares normal. Septum midline. Mucosa normal. No drainage or sinus tenderness. Throat: lips, mucosa, and tongue normal; teeth and gums normal Neck: no adenopathy, no carotid bruit, no JVD, supple, symmetrical, trachea midline and thyroid not enlarged, symmetric, no tenderness/mass/nodules Lungs: clear to auscultation bilaterally Breasts: normal appearance, no masses or tenderness Heart: regular rate and rhythm, S1, S2 normal, no murmur, click, rub or gallop Abdomen: soft, non-tender; bowel sounds normal; no masses,  no organomegaly Extremities: extremities normal, atraumatic, no cyanosis or edema Pulses: 2+ and symmetric Skin: Skin color, texture, turgor normal. No rashes or lesions Neurologic: Alert and oriented X 3, normal strength and tone. Normal symmetric reflexes. Normal coordination and gait.      Assessment & Plan:   Problem List Items Addressed This Visit    Spondylosis of lumbar region without myelopathy or radiculopathy    L3-L4 by films ready by EMerge Orto.  Mobic,  Tylenol,  Robaxin.  Deferred PT due to cost.  Stretching exercises given      Relevant Medications   methocarbamol (ROBAXIN) 500 MG tablet   meloxicam (MOBIC) 15 MG tablet   Long-term use of high-risk medication    cmet ordered.  Advised to limit tylenol to 2000 mg daily,  Advised to take PPI if she uses an NSAID daily       Hypothyroidism, postradioiodine therapy    Checking TSH today       Relevant Orders   TSH   Hypokalemia   Hyperlipidemia LDL goal <130    Managed with atorvastatin.  CMET due.       HSV-2 (herpes simplex virus 2) infection     Recommended a daily suppressive dose of acyclovir       Relevant Medications   acyclovir (ZOVIRAX) 400 MG tablet   Generalized anxiety disorder    Managed with citalopram.      Essential hypertension - Primary   Relevant Orders   Comprehensive metabolic panel   Encounter for preventive health examination    Annual comprehensive preventive exam was done as well as an evaluation and management of chronic conditions .  During the course of the visit the patient was educated and counseled about appropriate screening and preventive services including :  diabetes screening, lipid analysis with projected  10 year  risk for CAD , nutrition counseling, breast, cervical and colorectal cancer screening, and recommended immunizations.  Printed recommendations for health maintenance screenings was give       Other Visit Diagnoses    Chronic right-sided low back pain without sciatica       Relevant Medications   methocarbamol (ROBAXIN) 500 MG tablet   meloxicam (MOBIC) 15 MG tablet      I have changed Ms. Novell's acyclovir. I am also having her start on scopolamine, ciprofloxacin, and meloxicam. Additionally, I am having her maintain her Vitamin B-12, levothyroxine, amLODipine, conjugated estrogens, ondansetron, Vitamin D3, nystatin-triamcinolone, atorvastatin, citalopram, and methocarbamol.  Meds ordered this encounter  Medications  . methocarbamol (ROBAXIN) 500 MG tablet    Sig: methocarbamol 500 mg tablet  TAKE 2 TABLET(S) 4 TIMES A DAY BY ORAL ROUTE.  Marland Kitchen scopolamine (TRANSDERM-SCOP, 1.5 MG,) 1 MG/3DAYS    Sig: Place 1 patch (1.5 mg total) onto the skin every 3 (three) days.    Dispense:  10 patch    Refill:  0  . ciprofloxacin (CIPRO) 250 MG tablet    Sig: Take 1 tablet (250 mg total) by mouth 2 (two) times daily.    Dispense:  14 tablet    Refill:  0  . meloxicam (MOBIC) 15 MG tablet    Sig: Take 1 tablet (15 mg total) by mouth daily.    Dispense:  30 tablet    Refill:  3  .  acyclovir (ZOVIRAX) 400 MG tablet    Sig: Take 1 tablet (400 mg total) by mouth daily.    Dispense:  30 tablet    Refill:  11    Medications Discontinued During This Encounter  Medication Reason  . acyclovir (ZOVIRAX) 400 MG tablet Reorder    Follow-up: No Follow-up on file.   Crecencio Mc, MD

## 2017-07-05 NOTE — Assessment & Plan Note (Signed)
Checking TSH today.

## 2017-07-05 NOTE — Assessment & Plan Note (Signed)
Annual comprehensive preventive exam was done as well as an evaluation and management of chronic conditions .  During the course of the visit the patient was educated and counseled about appropriate screening and preventive services including :  diabetes screening, lipid analysis with projected  10 year  risk for CAD , nutrition counseling, breast, cervical and colorectal cancer screening, and recommended immunizations.  Printed recommendations for health maintenance screenings was give 

## 2017-07-05 NOTE — Assessment & Plan Note (Signed)
Managed with atorvastatin.  CMET due.

## 2017-07-05 NOTE — Assessment & Plan Note (Signed)
Recommended a daily suppressive dose of acyclovir

## 2017-07-05 NOTE — Assessment & Plan Note (Signed)
L3-L4 by films ready by EMerge Orto.  Mobic,  Tylenol,  Robaxin.  Deferred PT due to cost.  Stretching exercises given

## 2017-07-05 NOTE — Assessment & Plan Note (Signed)
cmet ordered.  Advised to limit tylenol to 2000 mg daily,  Advised to take PPI if she uses an NSAID daily

## 2017-07-06 ENCOUNTER — Telehealth: Payer: Self-pay

## 2017-07-06 ENCOUNTER — Other Ambulatory Visit: Payer: Self-pay | Admitting: Internal Medicine

## 2017-07-06 ENCOUNTER — Telehealth: Payer: Self-pay | Admitting: *Deleted

## 2017-07-06 ENCOUNTER — Encounter: Payer: Self-pay | Admitting: Internal Medicine

## 2017-07-06 MED ORDER — ACYCLOVIR 400 MG PO TABS: 400.0000 mg | ORAL_TABLET | Freq: Every day | ORAL | 11 refills | Status: DC

## 2017-07-06 MED ORDER — LEVOTHYROXINE SODIUM 112 MCG PO TABS: ORAL_TABLET | ORAL | 1 refills | Status: DC

## 2017-07-06 NOTE — Telephone Encounter (Signed)
Spoke with pt and informed the pt that the cipro is for her to take on vacation incase she develops travelers diarrhea. Also resent the acyclovir to the pharmacy because they did not receive it. Pt was under the impression that there should have been another medication sent in for herpes that she would take daily not just when she had a breakout. Please advise.

## 2017-07-06 NOTE — Telephone Encounter (Signed)
Resent acyclovir to pharmacy.

## 2017-07-06 NOTE — Telephone Encounter (Signed)
Pt has requested a call to discuss the script for ciprofloxacin , pt also stated that acyclovir was not called into the pharmacy Pt contact [phone]

## 2017-07-07 NOTE — Telephone Encounter (Signed)
The ACYCLOVIR  Is daily

## 2017-07-07 NOTE — Telephone Encounter (Signed)
Patient aware and gave verbal understanding  

## 2017-07-12 ENCOUNTER — Telehealth: Payer: Self-pay | Admitting: *Deleted

## 2017-07-12 ENCOUNTER — Other Ambulatory Visit: Payer: Self-pay

## 2017-07-12 MED ORDER — ACYCLOVIR 400 MG PO TABS: 400.0000 mg | ORAL_TABLET | Freq: Every day | ORAL | 3 refills | Status: DC

## 2017-07-12 NOTE — Telephone Encounter (Signed)
Pt requested to have a 90 day supply script for [facility]

## 2017-07-12 NOTE — Telephone Encounter (Signed)
Script sent  

## 2017-07-19 ENCOUNTER — Other Ambulatory Visit: Payer: Self-pay | Admitting: Internal Medicine

## 2017-08-17 ENCOUNTER — Other Ambulatory Visit: Payer: Self-pay | Admitting: Internal Medicine

## 2017-09-06 ENCOUNTER — Other Ambulatory Visit: Payer: Self-pay | Admitting: *Deleted

## 2017-09-06 MED ORDER — MELOXICAM 15 MG PO TABS: 15.0000 mg | ORAL_TABLET | Freq: Every day | ORAL | 0 refills | Status: DC

## 2017-10-06 ENCOUNTER — Ambulatory Visit: Payer: Medicare HMO | Admitting: Family Medicine

## 2017-10-06 ENCOUNTER — Encounter: Payer: Self-pay | Admitting: Family Medicine

## 2017-10-06 VITALS — BP 118/72 | HR 68 | Temp 97.9°F | Wt 217.8 lb

## 2017-10-06 DIAGNOSIS — J3489 Other specified disorders of nose and nasal sinuses: Secondary | ICD-10-CM

## 2017-10-06 DIAGNOSIS — J069 Acute upper respiratory infection, unspecified: Secondary | ICD-10-CM

## 2017-10-06 DIAGNOSIS — R059 Cough, unspecified: Secondary | ICD-10-CM

## 2017-10-06 DIAGNOSIS — R05 Cough: Secondary | ICD-10-CM | POA: Diagnosis not present

## 2017-10-06 MED ORDER — AMOXICILLIN-POT CLAVULANATE 875-125 MG PO TABS: 1.0000 | ORAL_TABLET | Freq: Two times a day (BID) | ORAL | 0 refills | Status: DC

## 2017-10-06 MED ORDER — BENZONATATE 100 MG PO CAPS: 100.0000 mg | ORAL_CAPSULE | Freq: Three times a day (TID) | ORAL | 0 refills | Status: DC

## 2017-10-06 NOTE — Patient Instructions (Signed)
It was a pleasure to see you today.  -Your symptoms are most likely related to a viral illness. Please drink plenty of water so that your urine is pale yellow or clear. Also, get plenty of rest, use tylenol as needed for discomfort and follow up if symptoms do not improve in 3 to 4 days, worsen, or you develop a fever >101.  -as we discussed, please hold on to antibiotic prescription that has been provided and take if symptoms do not improve in 48 to 72 hours.

## 2017-10-06 NOTE — Progress Notes (Signed)
Patient ID: Julie Perez, female   DOB: [birth year], 71 y.o.   MRN: [id number]   PCP: Julie Mc, MD  Subjective:  Julie Perez is a 71 y.o. year old very pleasant female patient who presents with Upper Respiratory infection symptoms including nasal congestion, mild sore throat, cough that is productive with yellow sputum, post nasal drip,and ear pain -started: 5 to 6 days ago, symptoms are not improving but worsening -previous treatments: Alka Seltzer x 1 which provided limited benefit -sick contacts/travel/risks: denies flu exposure. UTD with influenza vaccine 09/08/17. Recent airline travel. -Hx of: HTN  ROS-denies fever, SOB, NVD, tooth pain  Pertinent Past Medical History- HTN  Medications- reviewed  Current Outpatient Medications  Medication Sig Dispense Refill  . acyclovir (ZOVIRAX) 400 MG tablet Take 1 tablet (400 mg total) by mouth daily. 90 tablet 3  . amLODipine (NORVASC) 2.5 MG tablet TAKE 1 TABLET AT BEDTIME 90 tablet 2  . atorvastatin (LIPITOR) 40 MG tablet TAKE 1 TABLET DAILY AT 6 P.M. 90 tablet 1  . Cholecalciferol (VITAMIN D3) 1000 units CAPS Take 1 capsule by mouth.    . citalopram (CELEXA) 40 MG tablet TAKE 1 TABLET DAILY 90 tablet 1  . conjugated estrogens (PREMARIN) vaginal cream Place 1 Applicatorful vaginally daily. FOR 2 WEEKS,  THEN TWICE WEEKLY THEREAFTER 42.5 g 12  . Cyanocobalamin (VITAMIN B-12) 2500 MCG SUBL Place 1 tablet under the tongue daily. Reported on 05/20/2016    . levothyroxine (SYNTHROID, LEVOTHROID) 112 MCG tablet TAKE 1 TABLET DAILY BEFORE BREAKFAST 90 tablet 0  . meloxicam (MOBIC) 15 MG tablet Take 1 tablet (15 mg total) by mouth daily. 90 tablet 0  . methocarbamol (ROBAXIN) 500 MG tablet methocarbamol 500 mg tablet  TAKE 2 TABLET(S) 4 TIMES A DAY BY ORAL ROUTE.    Marland Kitchen nystatin-triamcinolone (MYCOLOG II) cream Apply 1 application topically 2 (two) times daily. 30 g 2   No current facility-administered medications for this visit.      Objective: BP 118/72   Pulse 68   Temp 97.9 F (36.6 C) (Oral)   Wt 217 lb 12.8 oz (98.8 kg)   SpO2 96%   BMI 35.15 kg/m  Gen: NAD, resting comfortably HEENT: Turbinates erythematous, TM normal, pharynx mildly erythematous with post nasal drip and  no tonsilar exudate or edema, positive sinus tenderness CV: RRR no murmurs rubs or gallops Lungs: CTAB no crackles, wheeze, rhonchi Abdomen: soft/nontender/nondistended/normal bowel sounds. No rebound or guarding.  Ext: no edema Skin: warm, dry, no rash Neuro: grossly normal, moves all extremities  Assessment/Plan:  1. Upper Respiratory infection History and exam today are suggestive of viral infection most likely due to upper respiratory infection. Symptomatic treatment with: rest, fluids, tylenol, and benzonatate for cough as needed. Advised patient on supportive measures:  Get rest, drink plenty of fluids, and use tylenol  as needed for pain. Follow up if fever >101, if symptoms worsen; antibiotic can be taken or if symptoms are not improved in 3 to 4 days. Patient verbalizes understanding.    2. Sinus pressure Symptom duration of 5 to 6 days and worsening symptoms, we discussed that this is most likely viral in nature; we did discuss prudent antibiotic stewardship also. As symptoms have been present and worsening; we agreed that she will hold off on an antibiotic until 48 to 72 hours and will take antibiotic if symptoms do not improve or worsen. Written prescription for Augmentin provided. Tylenol for discomfort and she will hold off on antibiotic for  48 to 72 hours and will use if symptoms do not improve.  3. Cough Mucinex plain or benzonatate for cough as needed.  We discussed that we did not find any infection that had higher probability of being bacterial such as pneumonia or strep throat. We discussed signs to monitor for such as  fever or shortness of breath.  Finally, we reviewed reasons to return to care including if  symptoms worsen or persist or new concerns arise- once again particularly shortness of breath or fever.    Julie Quint, FNP

## 2017-10-08 ENCOUNTER — Telehealth: Payer: Self-pay | Admitting: Internal Medicine

## 2017-10-08 NOTE — Telephone Encounter (Signed)
Pt stated she feels better after taking 2 days of antibiotics. Pt asking if she needs to stop taking the ABX. Advised pt to complete course until pills are all gone.

## 2017-10-17 ENCOUNTER — Other Ambulatory Visit: Payer: Self-pay | Admitting: Internal Medicine

## 2017-11-23 ENCOUNTER — Other Ambulatory Visit: Payer: Self-pay | Admitting: Internal Medicine

## 2017-12-03 ENCOUNTER — Other Ambulatory Visit: Payer: Self-pay | Admitting: Internal Medicine

## 2017-12-21 ENCOUNTER — Other Ambulatory Visit: Payer: Self-pay | Admitting: Internal Medicine

## 2017-12-31 ENCOUNTER — Ambulatory Visit: Payer: Medicare HMO | Admitting: Internal Medicine

## 2017-12-31 ENCOUNTER — Encounter: Payer: Self-pay | Admitting: Internal Medicine

## 2017-12-31 VITALS — BP 152/80 | HR 59 | Temp 97.9°F | Resp 18 | Ht 67.0 in | Wt 218.5 lb

## 2017-12-31 DIAGNOSIS — M5416 Radiculopathy, lumbar region: Secondary | ICD-10-CM | POA: Diagnosis not present

## 2017-12-31 DIAGNOSIS — E538 Deficiency of other specified B group vitamins: Secondary | ICD-10-CM | POA: Diagnosis not present

## 2017-12-31 DIAGNOSIS — I10 Essential (primary) hypertension: Secondary | ICD-10-CM | POA: Diagnosis not present

## 2017-12-31 DIAGNOSIS — E876 Hypokalemia: Secondary | ICD-10-CM | POA: Diagnosis not present

## 2017-12-31 DIAGNOSIS — Z79899 Other long term (current) drug therapy: Secondary | ICD-10-CM | POA: Diagnosis not present

## 2017-12-31 DIAGNOSIS — E890 Postprocedural hypothyroidism: Secondary | ICD-10-CM

## 2017-12-31 DIAGNOSIS — E785 Hyperlipidemia, unspecified: Secondary | ICD-10-CM

## 2017-12-31 DIAGNOSIS — E559 Vitamin D deficiency, unspecified: Secondary | ICD-10-CM | POA: Diagnosis not present

## 2017-12-31 DIAGNOSIS — M5431 Sciatica, right side: Secondary | ICD-10-CM | POA: Diagnosis not present

## 2017-12-31 LAB — COMPREHENSIVE METABOLIC PANEL
ALT: 15 U/L (ref 0–35)
AST: 17 U/L (ref 0–37)
Albumin: 4.3 g/dL (ref 3.5–5.2)
Alkaline Phosphatase: 49 U/L (ref 39–117)
BUN: 18 mg/dL (ref 6–23)
CALCIUM: 9.2 mg/dL (ref 8.4–10.5)
CO2: 29 mEq/L (ref 19–32)
CREATININE: 0.81 mg/dL (ref 0.40–1.20)
Chloride: 103 mEq/L (ref 96–112)
GFR: 74.17 mL/min (ref >60.00)
Glucose, Bld: 99 mg/dL (ref 70–99)
Potassium: 4.1 mEq/L (ref 3.5–5.1)
SODIUM: 138 meq/L (ref 135–145)
Total Bilirubin: 0.8 mg/dL (ref 0.2–1.2)
Total Protein: 7.3 g/dL (ref 6.0–8.3)

## 2017-12-31 LAB — VITAMIN D 25 HYDROXY (VIT D DEFICIENCY, FRACTURES): VITD: 25.51 ng/mL — ABNORMAL LOW (ref 30.00–100.00)

## 2017-12-31 MED ORDER — METHOCARBAMOL 500 MG PO TABS: 500.0000 mg | ORAL_TABLET | Freq: Four times a day (QID) | ORAL | 2 refills | Status: DC | PRN

## 2017-12-31 MED ORDER — TRAMADOL HCL 50 MG PO TABS: 50.0000 mg | ORAL_TABLET | Freq: Four times a day (QID) | ORAL | 0 refills | Status: DC | PRN

## 2017-12-31 MED ORDER — PREDNISONE 10 MG PO TABS: ORAL_TABLET | ORAL | 0 refills | Status: DC

## 2017-12-31 MED ORDER — AMLODIPINE BESYLATE 5 MG PO TABS: 5.0000 mg | ORAL_TABLET | Freq: Every day | ORAL | 1 refills | Status: DC

## 2017-12-31 NOTE — Patient Instructions (Addendum)
I am prescribing a course of Prednisone for 8 days  ( 6 tablets all at once on  days 1  , 2 and 3,  Then reduce  Daily dose by 1 tablet each day until gone )  No meloxicam until you have finished the  Prednisone   Ok to use tylenol and tramadol together  (sart with tylenol and add tramadol if needed)   Methocarbamol Is a muscle relaxer.  Do not  combine with alcohol. It will make you sleepy  Heating pads and ice are your friend.      Increase your amlodipine to 5 mg daily at bedtime

## 2017-12-31 NOTE — Progress Notes (Signed)
Pre-visit discussion using our clinic review tool. No additional management support is needed unless otherwise documented below in the visit note.  

## 2017-12-31 NOTE — Progress Notes (Signed)
Subjective:  Patient ID: Julie Perez, female    DOB: [birth year]  Age: 72 y.o. MRN: [id number]  CC: The primary encounter diagnosis was Sciatica of right side. Diagnoses of Long-term use of high-risk medication, Vitamin D deficiency, Right lumbar radiculopathy, Hypokalemia, Essential hypertension, and B12 deficiency were also pertinent to this visit.  HPI Tempest Frankland presents for   6 month Follow up on hypertension, hyperlipidemia and other chronic issues,  And  for evaluation of bilateral low back pain with right sided sciatica.     1) sciatica:  She has a history of lumbar spondylosis  With prior 2018  eval by Orthopedics.  At that time PT was  ordered but patient defered due to cost .  Her pain was previously controlled with meloxiam,  But currently has been persistent an has been aggravated by activities involving with house cleaning that require prolonged bending (moppin and vacuuming) .  She denies foot drop, leg weakness,  Tripping and falls.    2) Hypertension: patient checks blood pressure twice weekly at home.  Readings have been elevated for the last week,    > 140/80 at rest . Patient is following a reduced salt diet most days and is taking medications as prescribed. Taking amlodipine     Outpatient Medications Prior to Visit  Medication Sig Dispense Refill  . acyclovir (ZOVIRAX) 400 MG tablet Take 1 tablet (400 mg total) by mouth daily. 90 tablet 3  . atorvastatin (LIPITOR) 40 MG tablet TAKE 1 TABLET DAILY AT 6 P.M. 90 tablet 1  . Cholecalciferol (VITAMIN D3) 1000 units CAPS Take 1 capsule by mouth.    . citalopram (CELEXA) 40 MG tablet TAKE 1 TABLET DAILY 90 tablet 0  . conjugated estrogens (PREMARIN) vaginal cream Place 1 Applicatorful vaginally daily. FOR 2 WEEKS,  THEN TWICE WEEKLY THEREAFTER 42.5 g 12  . levothyroxine (SYNTHROID, LEVOTHROID) 112 MCG tablet TAKE 1 TABLET DAILY BEFORE BREAKFAST 90 tablet 0  . meloxicam (MOBIC) 15 MG tablet TAKE 1 TABLET BY MOUTH EVERY  DAY 90 tablet 1  . nystatin-triamcinolone (MYCOLOG II) cream Apply 1 application topically 2 (two) times daily. 30 g 2  . amLODipine (NORVASC) 2.5 MG tablet TAKE 1 TABLET AT BEDTIME 90 tablet 2  . Tdap (BOOSTRIX) 5-2.5-18.5 LF-MCG/0.5 injection Boostrix Tdap 2.5 Lf unit-8 mcg-5 Lf/0.5 mL intramuscular syringe    . amoxicillin-clavulanate (AUGMENTIN) 875-125 MG tablet Take 1 tablet 2 (two) times daily by mouth. 20 tablet 0  . benzonatate (TESSALON) 100 MG capsule Take 1 capsule (100 mg total) 3 (three) times daily by mouth. 20 capsule 0  . Cyanocobalamin (VITAMIN B-12) 2500 MCG SUBL Place 1 tablet under the tongue daily. Reported on 05/20/2016    . methocarbamol (ROBAXIN) 500 MG tablet methocarbamol 500 mg tablet  TAKE 2 TABLET(S) 4 TIMES A DAY BY ORAL ROUTE.     No facility-administered medications prior to visit.     Review of Systems;  Patient denies headache, fevers, malaise, unintentional weight loss, skin rash, eye pain, sinus congestion and sinus pain, sore throat, dysphagia,  hemoptysis , cough, dyspnea, wheezing, chest pain, palpitations, orthopnea, edema, abdominal pain, nausea, melena, diarrhea, constipation, flank pain, dysuria, hematuria, urinary  Frequency, nocturia, numbness, tingling, seizures,  Focal weakness, Loss of consciousness,  Tremor, insomnia, depression, anxiety, and suicidal ideation.      Objective:  BP (!) 152/80 (BP Location: Left Arm, Patient Position: Sitting, Cuff Size: Normal)   Pulse (!) 59   Temp 97.9 F (36.6 C) (  Oral)   Resp 18   Ht 5\' 7"  (1.702 m)   Wt 218 lb 8 oz (99.1 kg)   SpO2 97%   BMI 34.22 kg/m   BP Readings from Last 3 Encounters:  12/31/17 (!) 152/80  10/06/17 118/72  07/05/17 128/70    Wt Readings from Last 3 Encounters:  12/31/17 218 lb 8 oz (99.1 kg)  10/06/17 217 lb 12.8 oz (98.8 kg)  07/05/17 219 lb 3.2 oz (99.4 kg)    General appearance: alert, cooperative and appears stated age Back: symmetric, no curvature. ROM normal.  No CVA tenderness. Lungs: clear to auscultation bilaterally Heart: regular rate and rhythm, S1, S2 normal, no murmur, click, rub or gallop Abdomen: soft, non-tender; bowel sounds normal; no masses,  no organomegaly Pulses: 2+ and symmetric Skin: Skin color, texture, turgor normal. No rashes or lesions Lymph nodes: Cervical, supraclavicular, and axillary nodes normal. Neuro:  Positive straight leg lift on the righ. t DTR's 2+ bilaterally  Motor examination shows 4+/5 symmetric hand grip and upper extremity and 5/5 lower extremity strength.    Lab Results  Component Value Date   HGBA1C 5.5 01/04/2017    Lab Results  Component Value Date   CREATININE 0.81 12/31/2017   CREATININE 0.65 07/05/2017   CREATININE 0.77 05/03/2017    Lab Results  Component Value Date   WBC 10.2 01/29/2017   HGB 13.7 01/29/2017   HCT 39.8 01/29/2017   PLT 235 01/29/2017   GLUCOSE 99 12/31/2017   CHOL 185 01/04/2017   TRIG 165.0 (H) 01/04/2017   HDL 53.50 01/04/2017   LDLDIRECT 113.0 05/20/2016   LDLCALC 98 01/04/2017   ALT 15 12/31/2017   AST 17 12/31/2017   NA 138 12/31/2017   K 4.1 12/31/2017   CL 103 12/31/2017   CREATININE 0.81 12/31/2017   BUN 18 12/31/2017   CO2 29 12/31/2017   TSH 2.04 07/05/2017   HGBA1C 5.5 01/04/2017    Assessment & Plan:   Problem List Items Addressed This Visit    Vitamin D deficiency    Increase dose to 2000 Ius daily       Relevant Orders   VITAMIN D 25 Hydroxy (Vit-D Deficiency, Fractures) (Completed)   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  Prednisone taper plus tylenol/tramadol.  Prn robaxin.  Suspend melosicam.  If no resolution of sciatica,  Will need an MRI       Relevant Medications   methocarbamol (ROBAXIN) 500 MG tablet   Long-term use of high-risk medication    LTS are normal.  Continue atorvastatin   Lab Results  Component Value Date   ALT 15 12/31/2017   AST 17 12/31/2017   ALKPHOS 49 12/31/2017   BILITOT 0.8  12/31/2017         Relevant Orders   Comprehensive metabolic panel (Completed)   Hypokalemia    Secondary to prior gi illness.  Resolved.  Lab Results  Component Value Date   NA 138 12/31/2017   K 4.1 12/31/2017   CL 103 12/31/2017   CO2 29 12/31/2017   No results found for: Derl Barrow        Essential hypertension    Not at goal  amlodipine increased to 5 mg daily   Lab Results  Component Value Date   CREATININE 0.81 12/31/2017         Relevant Medications   amLODipine (NORVASC) 5 MG tablet   B12 deficiency    Level has not been checked  since 2017.  She is taking sublingual supplements.  Will recheck with next lab draw  Lab Results  Component Value Date   VITAMINB12 311 05/20/2016          Other Visit Diagnoses    Sciatica of right side    -  Primary   Relevant Medications   methocarbamol (ROBAXIN) 500 MG tablet   predniSONE (DELTASONE) 10 MG tablet      I have discontinued Garvin Fila. Feigenbaum's Vitamin B-12, amoxicillin-clavulanate, and benzonatate. I have also changed her methocarbamol and amLODipine. Additionally, I am having her start on predniSONE and traMADol. Lastly, I am having her maintain her conjugated estrogens, Vitamin D3, nystatin-triamcinolone, acyclovir, levothyroxine, atorvastatin, meloxicam, citalopram, and Tdap.  Meds ordered this encounter  Medications  . methocarbamol (ROBAXIN) 500 MG tablet    Sig: Take 1 tablet (500 mg total) by mouth every 6 (six) hours as needed for muscle spasms.    Dispense:  120 tablet    Refill:  2  . predniSONE (DELTASONE) 10 MG tablet    Sig: 6 tablets daily for 3 days,  then reduce by 1 tablet daily until gone    Dispense:  33 tablet    Refill:  0  . traMADol (ULTRAM) 50 MG tablet    Sig: Take 1 tablet (50 mg total) by mouth every 6 (six) hours as needed.    Dispense:  30 tablet    Refill:  0  . amLODipine (NORVASC) 5 MG tablet    Sig: Take 1 tablet (5 mg total) by mouth at bedtime.     Dispense:  90 tablet    Refill:  1    Medications Discontinued During This Encounter  Medication Reason  . amoxicillin-clavulanate (AUGMENTIN) 875-125 MG tablet Error  . benzonatate (TESSALON) 100 MG capsule Error  . methocarbamol (ROBAXIN) 500 MG tablet Error  . Cyanocobalamin (VITAMIN B-12) 2500 MCG SUBL Error  . amLODipine (NORVASC) 2.5 MG tablet     Follow-up: No Follow-up on file.   Crecencio Mc, MD

## 2018-01-02 ENCOUNTER — Encounter: Payer: Self-pay | Admitting: Internal Medicine

## 2018-01-02 NOTE — Assessment & Plan Note (Signed)
Increase dose to 2000 Ius daily

## 2018-01-02 NOTE — Assessment & Plan Note (Signed)
LTS are normal.  Continue atorvastatin   Lab Results  Component Value Date   ALT 15 12/31/2017   AST 17 12/31/2017   ALKPHOS 49 12/31/2017   BILITOT 0.8 12/31/2017

## 2018-01-02 NOTE — Assessment & Plan Note (Signed)
Not at goal  amlodipine increased to 5 mg daily   Lab Results  Component Value Date   CREATININE 0.81 12/31/2017

## 2018-01-02 NOTE — Assessment & Plan Note (Signed)
Level has not been checked since 2017.  She is taking sublingual supplements.  Will recheck with next lab draw  Lab Results  Component Value Date   VITAMINB12 311 05/20/2016

## 2018-01-02 NOTE — Assessment & Plan Note (Signed)
Secondary to spondylolysis .  Prednisone taper plus tylenol/tramadol.  Prn robaxin.  Suspend melosicam.  If no resolution of sciatica,  Will need an MRI

## 2018-01-02 NOTE — Assessment & Plan Note (Signed)
Secondary to prior gi illness.  Resolved.  Lab Results  Component Value Date   NA 138 12/31/2017   K 4.1 12/31/2017   CL 103 12/31/2017   CO2 29 12/31/2017   No results found for: Derl Barrow

## 2018-01-15 ENCOUNTER — Other Ambulatory Visit: Payer: Self-pay | Admitting: Internal Medicine

## 2018-02-07 DIAGNOSIS — M51369 Other intervertebral disc degeneration, lumbar region without mention of lumbar back pain or lower extremity pain: Secondary | ICD-10-CM | POA: Insufficient documentation

## 2018-02-07 DIAGNOSIS — M5136 Other intervertebral disc degeneration, lumbar region: Secondary | ICD-10-CM | POA: Insufficient documentation

## 2018-02-07 DIAGNOSIS — M545 Low back pain: Secondary | ICD-10-CM | POA: Diagnosis not present

## 2018-02-07 DIAGNOSIS — M5441 Lumbago with sciatica, right side: Secondary | ICD-10-CM | POA: Diagnosis not present

## 2018-02-07 DIAGNOSIS — E669 Obesity, unspecified: Secondary | ICD-10-CM | POA: Diagnosis not present

## 2018-02-07 DIAGNOSIS — M5440 Lumbago with sciatica, unspecified side: Secondary | ICD-10-CM | POA: Diagnosis not present

## 2018-02-07 DIAGNOSIS — G8929 Other chronic pain: Secondary | ICD-10-CM | POA: Diagnosis not present

## 2018-02-07 DIAGNOSIS — M5416 Radiculopathy, lumbar region: Secondary | ICD-10-CM | POA: Diagnosis not present

## 2018-02-23 DIAGNOSIS — R531 Weakness: Secondary | ICD-10-CM | POA: Diagnosis not present

## 2018-02-23 DIAGNOSIS — G8929 Other chronic pain: Secondary | ICD-10-CM | POA: Diagnosis not present

## 2018-02-23 DIAGNOSIS — M5441 Lumbago with sciatica, right side: Secondary | ICD-10-CM | POA: Diagnosis not present

## 2018-02-23 DIAGNOSIS — R69 Illness, unspecified: Secondary | ICD-10-CM | POA: Diagnosis not present

## 2018-02-28 DIAGNOSIS — G8929 Other chronic pain: Secondary | ICD-10-CM | POA: Diagnosis not present

## 2018-02-28 DIAGNOSIS — M5441 Lumbago with sciatica, right side: Secondary | ICD-10-CM | POA: Diagnosis not present

## 2018-03-02 DIAGNOSIS — G8929 Other chronic pain: Secondary | ICD-10-CM | POA: Diagnosis not present

## 2018-03-02 DIAGNOSIS — E669 Obesity, unspecified: Secondary | ICD-10-CM | POA: Diagnosis not present

## 2018-03-02 DIAGNOSIS — M5136 Other intervertebral disc degeneration, lumbar region: Secondary | ICD-10-CM | POA: Diagnosis not present

## 2018-03-02 DIAGNOSIS — M5441 Lumbago with sciatica, right side: Secondary | ICD-10-CM | POA: Diagnosis not present

## 2018-03-21 ENCOUNTER — Other Ambulatory Visit: Payer: Self-pay | Admitting: Internal Medicine

## 2018-03-28 DIAGNOSIS — Z872 Personal history of diseases of the skin and subcutaneous tissue: Secondary | ICD-10-CM | POA: Diagnosis not present

## 2018-03-28 DIAGNOSIS — L821 Other seborrheic keratosis: Secondary | ICD-10-CM | POA: Diagnosis not present

## 2018-03-28 DIAGNOSIS — L570 Actinic keratosis: Secondary | ICD-10-CM | POA: Diagnosis not present

## 2018-03-28 DIAGNOSIS — L578 Other skin changes due to chronic exposure to nonionizing radiation: Secondary | ICD-10-CM | POA: Diagnosis not present

## 2018-05-02 ENCOUNTER — Ambulatory Visit: Payer: Medicare HMO | Admitting: Family Medicine

## 2018-05-02 ENCOUNTER — Encounter: Payer: Self-pay | Admitting: Family Medicine

## 2018-05-02 VITALS — BP 124/78 | HR 68 | Temp 98.6°F | Resp 12 | Ht 65.5 in | Wt 213.9 lb

## 2018-05-02 DIAGNOSIS — F411 Generalized anxiety disorder: Secondary | ICD-10-CM | POA: Diagnosis not present

## 2018-05-02 DIAGNOSIS — G4733 Obstructive sleep apnea (adult) (pediatric): Secondary | ICD-10-CM

## 2018-05-02 DIAGNOSIS — Z1231 Encounter for screening mammogram for malignant neoplasm of breast: Secondary | ICD-10-CM | POA: Diagnosis not present

## 2018-05-02 DIAGNOSIS — R3129 Other microscopic hematuria: Secondary | ICD-10-CM

## 2018-05-02 DIAGNOSIS — I517 Cardiomegaly: Secondary | ICD-10-CM

## 2018-05-02 DIAGNOSIS — I10 Essential (primary) hypertension: Secondary | ICD-10-CM

## 2018-05-02 DIAGNOSIS — E538 Deficiency of other specified B group vitamins: Secondary | ICD-10-CM | POA: Diagnosis not present

## 2018-05-02 DIAGNOSIS — E785 Hyperlipidemia, unspecified: Secondary | ICD-10-CM

## 2018-05-02 DIAGNOSIS — E890 Postprocedural hypothyroidism: Secondary | ICD-10-CM | POA: Diagnosis not present

## 2018-05-02 DIAGNOSIS — Z5181 Encounter for therapeutic drug level monitoring: Secondary | ICD-10-CM

## 2018-05-02 DIAGNOSIS — R5383 Other fatigue: Secondary | ICD-10-CM

## 2018-05-02 DIAGNOSIS — R351 Nocturia: Secondary | ICD-10-CM | POA: Diagnosis not present

## 2018-05-02 DIAGNOSIS — Z1239 Encounter for other screening for malignant neoplasm of breast: Secondary | ICD-10-CM

## 2018-05-02 DIAGNOSIS — N952 Postmenopausal atrophic vaginitis: Secondary | ICD-10-CM

## 2018-05-02 DIAGNOSIS — E669 Obesity, unspecified: Secondary | ICD-10-CM

## 2018-05-02 DIAGNOSIS — R69 Illness, unspecified: Secondary | ICD-10-CM | POA: Diagnosis not present

## 2018-05-02 DIAGNOSIS — E559 Vitamin D deficiency, unspecified: Secondary | ICD-10-CM

## 2018-05-02 MED ORDER — AMLODIPINE BESYLATE 5 MG PO TABS: 5.0000 mg | ORAL_TABLET | Freq: Every day | ORAL | 1 refills | Status: DC

## 2018-05-02 MED ORDER — LEVOTHYROXINE SODIUM 112 MCG PO TABS: 112.0000 ug | ORAL_TABLET | Freq: Every day | ORAL | 1 refills | Status: DC

## 2018-05-02 MED ORDER — ACYCLOVIR 400 MG PO TABS: 400.0000 mg | ORAL_TABLET | Freq: Every day | ORAL | 3 refills | Status: DC

## 2018-05-02 MED ORDER — ATORVASTATIN CALCIUM 40 MG PO TABS: 40.0000 mg | ORAL_TABLET | Freq: Every day | ORAL | 1 refills | Status: DC

## 2018-05-02 NOTE — Progress Notes (Signed)
BP 124/78   Pulse 68   Temp 98.6 F (37 C) (Oral)   Resp 12   Ht 5' 5.5" (1.664 m)   Wt 213 lb 14.4 oz (97 kg)   SpO2 93%   BMI 35.05 kg/m    Subjective:    Patient ID: Julie Perez, female    DOB: 06-25-1946, 72 y.o.   MRN: [id number]  HPI: Julie Perez is a 72 y.o. female  Chief Complaint  Patient presents with  . New Patient (Initial Visit)    HPI Patient is here to establish care  She has been dealing with HTN for 15-20 years; not checking at home; likes her salt; on CCB; HTN runs in the family, just mother  High cholesterol; sausage and bacon; likes cheese; on medicine;  Premarin on list; hardly ever takes medicine  Vitamin D deficiency; not much sun exposure  Vitamin B12 deficiency  On tramadol and gabapentin and meloxcam; taking the NSAID daily in the afternoon; Reche [place], [place] for aches and pains  Celexa, her happy pills  She gets up at night 4 x every night; can go and then has to go again right away; going on for four months; no burning with urination; Dr. Derrel Nip has checked her for UTIs before  Depression screen [facility] 2/9 05/02/2018 05/20/2017 01/04/2017 11/28/2014  Decreased Interest 0 0 0 0  Down, Depressed, Hopeless 2 0 0 0  PHQ - 2 Score 2 0 0 0  Altered sleeping 0 - - -  Tired, decreased energy 3 - - -  Change in appetite 3 - - -  Feeling bad or failure about yourself  3 - - -  Trouble concentrating 0 - - -  Moving slowly or fidgety/restless 0 - - -  Suicidal thoughts 0 - - -  PHQ-9 Score 11 - - -  Difficult doing work/chores Not difficult at all - - -    Relevant past medical, surgical, family and social history reviewed Past Medical History:  Diagnosis Date  . Frequent headaches    stress  related  . Hyperlipidemia   . Hypertension   . Migraines   . Thyroid disease    Past Surgical History:  Procedure Laterality Date  . ABDOMINAL HYSTERECTOMY  1979   Family History  Problem Relation Age of Onset  . Hypertension Mother   .  Alzheimer's disease Mother   . Cancer Father 25       prostate cancer  . Cancer Sister 32       breast cancer  . Diabetes Brother        type 2  . Heart disease Sister        cabg x 4, tobacco abuse  . Diabetes Sister        type 2  . Aneurysm Sister    Social History   Tobacco Use  . Smoking status: Never Smoker  . Smokeless tobacco: Never Used  Substance Use Topics  . Alcohol use: Yes    Alcohol/week: 0.6 oz    Types: 1 Glasses of wine per week  . Drug use: No    Interim medical history since last visit reviewed. Allergies and medications reviewed  Review of Systems  Respiratory: Negative for shortness of breath.   Cardiovascular: Negative for chest pain.   Per HPI unless specifically indicated above     Objective:    BP 124/78   Pulse 68   Temp 98.6 F (37 C) (Oral)   Resp  12   Ht 5' 5.5" (1.664 m)   Wt 213 lb 14.4 oz (97 kg)   SpO2 93%   BMI 35.05 kg/m   Wt Readings from Last 3 Encounters:  05/02/18 213 lb 14.4 oz (97 kg)  12/31/17 218 lb 8 oz (99.1 kg)  10/06/17 217 lb 12.8 oz (98.8 kg)    Physical Exam  Constitutional: She appears well-developed and well-nourished. No distress.  Obese, very pleasant  HENT:  Head: Normocephalic and atraumatic.  Eyes: EOM are normal. No scleral icterus.  Neck: No thyromegaly present.  Cardiovascular: Normal rate, regular rhythm and normal heart sounds.  No murmur heard. Pulmonary/Chest: Effort normal and breath sounds normal. No respiratory distress. She has no wheezes.  Abdominal: Soft. Bowel sounds are normal. She exhibits no distension.  Musculoskeletal: Normal range of motion. She exhibits no edema.  Neurological: She is alert. She exhibits normal muscle tone.  Skin: Skin is warm and dry. She is not diaphoretic. No pallor.  Psychiatric: She has a normal mood and affect. Her behavior is normal. Judgment and thought content normal.       Assessment & Plan:   Problem List Items Addressed This Visit       Cardiovascular and Mediastinum   Cardiomegaly   Relevant Medications   amLODipine (NORVASC) 5 MG tablet   atorvastatin (LIPITOR) 40 MG tablet   Other Relevant Orders   ECHOCARDIOGRAM COMPLETE (Completed)   Essential hypertension   Relevant Medications   amLODipine (NORVASC) 5 MG tablet   atorvastatin (LIPITOR) 40 MG tablet     Respiratory   OSA (obstructive sleep apnea)    Patient does not wish to use machine right now; working on weight loss; politley declined referral to sleep specialist; might be the reason for the nocturia        Endocrine   Hypothyroidism, postradioiodine therapy - Primary   Relevant Medications   levothyroxine (SYNTHROID, LEVOTHROID) 112 MCG tablet   Other Relevant Orders   TSH (Completed)     Genitourinary   Postmenopausal atrophic vaginitis     Other   Fatigue   Relevant Orders   CBC with Differential/Platelet (Completed)   Obesity (BMI 30.0-34.9)   Hyperlipidemia LDL goal <130   Relevant Medications   amLODipine (NORVASC) 5 MG tablet   atorvastatin (LIPITOR) 40 MG tablet   Other Relevant Orders   Lipid panel (Completed)   Generalized anxiety disorder   Vitamin D deficiency   Relevant Orders   VITAMIN D 25 Hydroxy (Vit-D Deficiency, Fractures) (Completed)    Other Visit Diagnoses    Screening for breast cancer       Relevant Orders   MM 3D SCREEN BREAST BILATERAL   Vitamin B12 deficiency       Relevant Orders   Vitamin B12 (Completed)   Medication monitoring encounter       Relevant Orders   COMPLETE METABOLIC PANEL WITH GFR (Completed)   Nocturia       Relevant Orders   Urinalysis w microscopic + reflex cultur   Microscopic hematuria       Relevant Orders   Urinalysis w microscopic + reflex cultur       Follow up plan: Return in about 6 months (around 11/01/2018) for follow-up visit with Dr. Sanda Klein.  An after-visit summary was printed and given to the patient at [facility].  Please see the patient instructions which may contain  other information and recommendations beyond what is mentioned above in the assessment and plan.  Meds ordered  this encounter  Medications  . acyclovir (ZOVIRAX) 400 MG tablet    Sig: Take 1 tablet (400 mg total) by mouth daily.    Dispense:  90 tablet    Refill:  3  . amLODipine (NORVASC) 5 MG tablet    Sig: Take 1 tablet (5 mg total) by mouth at bedtime.    Dispense:  90 tablet    Refill:  1  . atorvastatin (LIPITOR) 40 MG tablet    Sig: Take 1 tablet (40 mg total) by mouth at bedtime.    Dispense:  90 tablet    Refill:  1  . levothyroxine (SYNTHROID, LEVOTHROID) 112 MCG tablet    Sig: Take 1 tablet (112 mcg total) by mouth daily before breakfast.    Dispense:  90 tablet    Refill:  1    Orders Placed This Encounter  Procedures  . Urine Culture  . MM 3D SCREEN BREAST BILATERAL  . VITAMIN D 25 Hydroxy (Vit-D Deficiency, Fractures)  . TSH  . Vitamin B12  . Lipid panel  . COMPLETE METABOLIC PANEL WITH GFR  . CBC with Differential/Platelet  . Urinalysis w microscopic + reflex cultur  . Urinalysis w microscopic + reflex cultur  . REFLEXIVE URINE CULTURE  . ECHOCARDIOGRAM COMPLETE

## 2018-05-02 NOTE — Patient Instructions (Addendum)
Check out the information at familydoctor.org entitled "Nutrition for Weight Loss: What You Need to Know about Fad Diets" Try to lose between 1-2 pounds per week by taking in fewer calories and burning off more calories You can succeed by limiting portions, limiting foods dense in calories and fat, becoming more active, and drinking 8 glasses of water a day (64 ounces) Don't skip meals, especially breakfast, as skipping meals may alter your metabolism Do not use over-the-counter weight loss pills or gimmicks that claim rapid weight loss A healthy BMI (or body mass index) is between 18.5 and 24.9 You can calculate your ideal BMI at the [employer] website [employer] Let's get labs today If you have not heard anything from my staff in a week about any orders/referrals/studies from today, please contact us here to follow-up [phone] Please call [phone] to schedule your echocardiogram Please wait 2-3 days after the order has been placed to call and get your echocardiogram scheduled

## 2018-05-02 NOTE — Assessment & Plan Note (Signed)
Patient does not wish to use machine right now; working on weight loss; politley declined referral to sleep specialist; might be the reason for the nocturia

## 2018-05-03 ENCOUNTER — Other Ambulatory Visit: Payer: Self-pay | Admitting: Family Medicine

## 2018-05-03 DIAGNOSIS — N3001 Acute cystitis with hematuria: Secondary | ICD-10-CM

## 2018-05-03 DIAGNOSIS — R8281 Pyuria: Secondary | ICD-10-CM

## 2018-05-03 MED ORDER — NITROFURANTOIN MONOHYD MACRO 100 MG PO CAPS: 100.0000 mg | ORAL_CAPSULE | Freq: Two times a day (BID) | ORAL | 0 refills | Status: DC

## 2018-05-03 NOTE — Progress Notes (Signed)
Start Rx; recheck urine in 2 weeks

## 2018-05-04 LAB — URINALYSIS W MICROSCOPIC + REFLEX CULTURE
Bilirubin Urine: NEGATIVE
Glucose, UA: NEGATIVE
Hgb urine dipstick: NEGATIVE
Hyaline Cast: NONE SEEN /LPF
Nitrites, Initial: NEGATIVE
PROTEIN: NEGATIVE
SPECIFIC GRAVITY, URINE: 1.028 (ref 1.001–1.03)
pH: 6 (ref 5.0–8.0)

## 2018-05-04 LAB — CBC WITH DIFFERENTIAL/PLATELET
BASOS ABS: 30 {cells}/uL (ref 0–200)
Basophils Relative: 0.4 %
Eosinophils Absolute: 113 cells/uL (ref 15–500)
Eosinophils Relative: 1.5 %
HCT: 40.6 % (ref 35.0–45.0)
HEMOGLOBIN: 14 g/dL (ref 11.7–15.5)
Lymphs Abs: 2325 cells/uL (ref 850–3900)
MCH: 30.2 pg (ref 27.0–33.0)
MCHC: 34.5 g/dL (ref 32.0–36.0)
MCV: 87.5 fL (ref 80.0–100.0)
MONOS PCT: 7.3 %
MPV: 11 fL (ref 7.5–12.5)
NEUTROS ABS: 4485 {cells}/uL (ref 1500–7800)
Neutrophils Relative %: 59.8 %
PLATELETS: 250 10*3/uL (ref 140–400)
RBC: 4.64 10*6/uL (ref 3.80–5.10)
RDW: 13 % (ref 11.0–15.0)
Total Lymphocyte: 31 %
WBC: 7.5 10*3/uL (ref 3.8–10.8)
WBCMIX: 548 {cells}/uL (ref 200–950)

## 2018-05-04 LAB — COMPLETE METABOLIC PANEL WITH GFR
AG RATIO: 1.8 (calc) (ref 1.0–2.5)
ALT: 13 U/L (ref 6–29)
AST: 15 U/L (ref 10–35)
Albumin: 4.5 g/dL (ref 3.6–5.1)
Alkaline phosphatase (APISO): 62 U/L (ref 33–130)
BUN: 13 mg/dL (ref 7–25)
CALCIUM: 9.2 mg/dL (ref 8.6–10.4)
CO2: 28 mmol/L (ref 20–32)
Chloride: 103 mmol/L (ref 98–110)
Creat: 0.72 mg/dL (ref 0.60–0.93)
GFR, EST NON AFRICAN AMERICAN: 85 mL/min/{1.73_m2} (ref >=60)
GFR, Est African American: 98 mL/min/{1.73_m2} (ref >=60)
GLOBULIN: 2.5 g/dL (ref 1.9–3.7)
Glucose, Bld: 85 mg/dL (ref 65–139)
POTASSIUM: 3.8 mmol/L (ref 3.5–5.3)
SODIUM: 140 mmol/L (ref 135–146)
TOTAL PROTEIN: 7 g/dL (ref 6.1–8.1)
Total Bilirubin: 0.8 mg/dL (ref 0.2–1.2)

## 2018-05-04 LAB — LIPID PANEL
CHOLESTEROL: 176 mg/dL (ref <200)
HDL: 54 mg/dL (ref >50)
LDL Cholesterol (Calc): 96 mg/dL (calc)
Non-HDL Cholesterol (Calc): 122 mg/dL (calc) (ref <130)
Total CHOL/HDL Ratio: 3.3 (calc) (ref <5.0)
Triglycerides: 155 mg/dL — ABNORMAL HIGH (ref <150)

## 2018-05-04 LAB — TSH: TSH: 3.01 m[IU]/L (ref 0.40–4.50)

## 2018-05-04 LAB — VITAMIN D 25 HYDROXY (VIT D DEFICIENCY, FRACTURES): VIT D 25 HYDROXY: 23 ng/mL — AB (ref 30–100)

## 2018-05-04 LAB — URINE CULTURE
MICRO NUMBER:: 90698795
SPECIMEN QUALITY: ADEQUATE

## 2018-05-04 LAB — VITAMIN B12: Vitamin B-12: 210 pg/mL (ref 200–1100)

## 2018-05-04 LAB — CULTURE INDICATED

## 2018-05-09 ENCOUNTER — Ambulatory Visit
Admission: RE | Admit: 2018-05-09 | Discharge: 2018-05-09 | Disposition: A | Discharge status: Home / Self Care | Payer: Medicare HMO | Source: Ambulatory Visit | Attending: Family Medicine | Admitting: Family Medicine

## 2018-05-09 DIAGNOSIS — E785 Hyperlipidemia, unspecified: Secondary | ICD-10-CM | POA: Insufficient documentation

## 2018-05-09 DIAGNOSIS — I517 Cardiomegaly: Secondary | ICD-10-CM | POA: Diagnosis not present

## 2018-05-09 DIAGNOSIS — I119 Hypertensive heart disease without heart failure: Secondary | ICD-10-CM | POA: Insufficient documentation

## 2018-05-09 NOTE — Progress Notes (Signed)
*  PRELIMINARY RESULTS* Echocardiogram 2D Echocardiogram has been performed.  Julie Perez 05/09/2018, 10:22 AM

## 2018-05-17 ENCOUNTER — Encounter: Payer: Self-pay | Admitting: Family Medicine

## 2018-05-17 DIAGNOSIS — I517 Cardiomegaly: Secondary | ICD-10-CM

## 2018-05-17 DIAGNOSIS — I5189 Other ill-defined heart diseases: Secondary | ICD-10-CM

## 2018-05-17 DIAGNOSIS — I519 Heart disease, unspecified: Secondary | ICD-10-CM | POA: Insufficient documentation

## 2018-05-17 HISTORY — DX: Other ill-defined heart diseases: I51.89

## 2018-05-17 HISTORY — DX: Cardiomegaly: I51.7

## 2018-05-23 ENCOUNTER — Ambulatory Visit: Payer: Medicare HMO

## 2018-05-31 ENCOUNTER — Other Ambulatory Visit: Payer: Self-pay

## 2018-05-31 DIAGNOSIS — N3001 Acute cystitis with hematuria: Secondary | ICD-10-CM

## 2018-05-31 DIAGNOSIS — R8281 Pyuria: Secondary | ICD-10-CM

## 2018-06-01 ENCOUNTER — Encounter: Payer: Self-pay | Admitting: Family Medicine

## 2018-06-01 ENCOUNTER — Other Ambulatory Visit: Payer: Self-pay | Admitting: Internal Medicine

## 2018-06-01 MED ORDER — SULFAMETHOXAZOLE-TRIMETHOPRIM 800-160 MG PO TABS: 1.0000 | ORAL_TABLET | Freq: Two times a day (BID) | ORAL | 0 refills | Status: DC

## 2018-06-02 LAB — URINALYSIS W MICROSCOPIC + REFLEX CULTURE
BILIRUBIN URINE: NEGATIVE
GLUCOSE, UA: NEGATIVE
HGB URINE DIPSTICK: NEGATIVE
Hyaline Cast: NONE SEEN /LPF
NITRITES URINE, INITIAL: NEGATIVE
Specific Gravity, Urine: 1.028 (ref 1.001–1.03)
pH: 6 (ref 5.0–8.0)

## 2018-06-02 LAB — URINE CULTURE
MICRO NUMBER:: 90816682
RESULT: NO GROWTH
SPECIMEN QUALITY: ADEQUATE

## 2018-06-02 LAB — CULTURE INDICATED

## 2018-06-15 ENCOUNTER — Ambulatory Visit
Admission: RE | Admit: 2018-06-15 | Discharge: 2018-06-15 | Disposition: A | Discharge status: Home / Self Care | Payer: Medicare HMO | Source: Ambulatory Visit | Attending: Family Medicine | Admitting: Family Medicine

## 2018-06-15 DIAGNOSIS — Z1231 Encounter for screening mammogram for malignant neoplasm of breast: Secondary | ICD-10-CM | POA: Diagnosis not present

## 2018-06-15 DIAGNOSIS — Z1239 Encounter for other screening for malignant neoplasm of breast: Secondary | ICD-10-CM

## 2018-07-11 ENCOUNTER — Encounter: Payer: Medicare HMO | Admitting: Internal Medicine

## 2018-08-09 ENCOUNTER — Ambulatory Visit: Payer: Medicare HMO

## 2018-08-23 ENCOUNTER — Ambulatory Visit (INDEPENDENT_AMBULATORY_CARE_PROVIDER_SITE_OTHER): Payer: Medicare HMO

## 2018-08-23 VITALS — BP 106/64 | HR 56 | Temp 98.4°F | Ht 66.0 in | Wt 219.0 lb

## 2018-08-23 DIAGNOSIS — Z135 Encounter for screening for eye and ear disorders: Secondary | ICD-10-CM | POA: Diagnosis not present

## 2018-08-23 DIAGNOSIS — K649 Unspecified hemorrhoids: Secondary | ICD-10-CM

## 2018-08-23 DIAGNOSIS — Z0000 Encounter for general adult medical examination without abnormal findings: Secondary | ICD-10-CM | POA: Diagnosis not present

## 2018-08-23 NOTE — Patient Instructions (Signed)
Julie Perez , Thank you for taking time to come for your Medicare Wellness Visit. I appreciate your ongoing commitment to your health goals. Please review the following plan we discussed and let me know if I can assist you in the future.   Screening recommendations/referrals: Colorectal Screening: Up to date Mammogram: Up to date Bone Density: Up to date  Vision and Dental Exams: Recommended annual ophthalmology exams for early detection of glaucoma and other disorders of the eye Recommended annual dental exams for proper oral hygiene  Vaccinations: Influenza vaccine: Up to date Pneumococcal vaccine: Up to date Tdap vaccine: Up to date Shingles vaccine: Please call your insurance company to determine your out of pocket expense for the Shingrix vaccine. You may receive this vaccine at your local pharmacy.  Advanced directives: Advance directives discussed with you today. I have provided a copy for you to complete at home and have notarized. Once this is complete please bring a copy in to our office so we can scan it into your chart.  Goals: Recommend to exercise for at least 150 minutes per week.  Next appointment: Please schedule your Annual Wellness Visit with your Nurse Health Advisor in one year.  Preventive Care 23 Years and Older, Female Preventive care refers to lifestyle choices and visits with your health care provider that can promote health and wellness. What does preventive care include?  A yearly physical exam. This is also called an annual well check.  Dental exams once or twice a year.  Routine eye exams. Ask your health care provider how often you should have your eyes checked.  Personal lifestyle choices, including:  Daily care of your teeth and gums.  Regular physical activity.  Eating a healthy diet.  Avoiding tobacco and drug use.  Limiting alcohol use.  Practicing safe sex.  Taking low-dose aspirin every day if recommended by your health care  provider.  Taking vitamin and mineral supplements as recommended by your health care provider. What happens during an annual well check? The services and screenings done by your health care provider during your annual well check will depend on your age, overall health, lifestyle risk factors, and family history of disease. Counseling  Your health care provider may ask you questions about your:  Alcohol use.  Tobacco use.  Drug use.  Emotional well-being.  Home and relationship well-being.  Sexual activity.  Eating habits.  History of falls.  Memory and ability to understand (cognition).  Work and work Statistician.  Reproductive health. Screening  You may have the following tests or measurements:  Height, weight, and BMI.  Blood pressure.  Lipid and cholesterol levels. These may be checked every 5 years, or more frequently if you are over 75 years old.  Skin check.  Lung cancer screening. You may have this screening every year starting at age 45 if you have a 30-pack-year history of smoking and currently smoke or have quit within the past 15 years.  Fecal occult blood test (FOBT) of the stool. You may have this test every year starting at age 60.  Flexible sigmoidoscopy or colonoscopy. You may have a sigmoidoscopy every 5 years or a colonoscopy every 10 years starting at age 29.  Hepatitis C blood test.  Hepatitis B blood test.  Sexually transmitted disease (STD) testing.  Diabetes screening. This is done by checking your blood sugar (glucose) after you have not eaten for a while (fasting). You may have this done every 1-3 years.  Bone density scan. This is  done to screen for osteoporosis. You may have this done starting at age 31.  Mammogram. This may be done every 1-2 years. Talk to your health care provider about how often you should have regular mammograms. Talk with your health care provider about your test results, treatment options, and if necessary, the  need for more tests. Vaccines  Your health care provider may recommend certain vaccines, such as:  Influenza vaccine. This is recommended every year.  Tetanus, diphtheria, and acellular pertussis (Tdap, Td) vaccine. You may need a Td booster every 10 years.  Zoster vaccine. You may need this after age 21.  Pneumococcal 13-valent conjugate (PCV13) vaccine. One dose is recommended after age 25.  Pneumococcal polysaccharide (PPSV23) vaccine. One dose is recommended after age 27. Talk to your health care provider about which screenings and vaccines you need and how often you need them. This information is not intended to replace advice given to you by your health care provider. Make sure you discuss any questions you have with your health care provider. Document Released: 12/06/2015 Document Revised: 07/29/2016 Document Reviewed: 09/10/2015 Elsevier Interactive Patient Education  2017 [place] Prevention in the Home Falls can cause injuries. They can happen to people of all ages. There are many things you can do to make your home safe and to help prevent falls. What can I do on the outside of my home?  Regularly fix the edges of walkways and driveways and fix any cracks.  Remove anything that might make you trip as you walk through a door, such as a raised step or threshold.  Trim any bushes or trees on the path to your home.  Use bright outdoor lighting.  Clear any walking paths of anything that might make someone trip, such as rocks or tools.  Regularly check to see if handrails are loose or broken. Make sure that both sides of any steps have handrails.  Any raised decks and porches should have guardrails on the edges.  Have any leaves, snow, or ice cleared regularly.  Use sand or salt on walking paths during winter.  Clean up any spills in your garage right away. This includes oil or grease spills. What can I do in the bathroom?  Use night lights.  Install grab  bars by the toilet and in the tub and shower. Do not use towel bars as grab bars.  Use non-skid mats or decals in the tub or shower.  If you need to sit down in the shower, use a plastic, non-slip stool.  Keep the floor dry. Clean up any water that spills on the floor as soon as it happens.  Remove soap buildup in the tub or shower regularly.  Attach bath mats securely with double-sided non-slip rug tape.  Do not have throw rugs and other things on the floor that can make you trip. What can I do in the bedroom?  Use night lights.  Make sure that you have a light by your bed that is easy to reach.  Do not use any sheets or blankets that are too big for your bed. They should not hang down onto the floor.  Have a firm chair that has side arms. You can use this for support while you get dressed.  Do not have throw rugs and other things on the floor that can make you trip. What can I do in the kitchen?  Clean up any spills right away.  Avoid walking on wet floors.  Keep  items that you use a lot in easy-to-reach places.  If you need to reach something above you, use a strong step stool that has a grab bar.  Keep electrical cords out of the way.  Do not use floor polish or wax that makes floors slippery. If you must use wax, use non-skid floor wax.  Do not have throw rugs and other things on the floor that can make you trip. What can I do with my stairs?  Do not leave any items on the stairs.  Make sure that there are handrails on both sides of the stairs and use them. Fix handrails that are broken or loose. Make sure that handrails are as long as the stairways.  Check any carpeting to make sure that it is firmly attached to the stairs. Fix any carpet that is loose or worn.  Avoid having throw rugs at the top or bottom of the stairs. If you do have throw rugs, attach them to the floor with carpet tape.  Make sure that you have a light switch at the top of the stairs and the  bottom of the stairs. If you do not have them, ask someone to add them for you. What else can I do to help prevent falls?  Wear shoes that:  Do not have high heels.  Have rubber bottoms.  Are comfortable and fit you well.  Are closed at the toe. Do not wear sandals.  If you use a stepladder:  Make sure that it is fully opened. Do not climb a closed stepladder.  Make sure that both sides of the stepladder are locked into place.  Ask someone to hold it for you, if possible.  Clearly mark and make sure that you can see:  Any grab bars or handrails.  First and last steps.  Where the edge of each step is.  Use tools that help you move around (mobility aids) if they are needed. These include:  Canes.  Walkers.  Scooters.  Crutches.  Turn on the lights when you go into a dark area. Replace any light bulbs as soon as they burn out.  Set up your furniture so you have a clear path. Avoid moving your furniture around.  If any of your floors are uneven, fix them.  If there are any pets around you, be aware of where they are.  Review your medicines with your doctor. Some medicines can make you feel dizzy. This can increase your chance of falling. Ask your doctor what other things that you can do to help prevent falls. This information is not intended to replace advice given to you by your health care provider. Make sure you discuss any questions you have with your health care provider. Document Released: 09/05/2009 Document Revised: 04/16/2016 Document Reviewed: 12/14/2014 Elsevier Interactive Patient Education  2017 [employer].

## 2018-08-23 NOTE — Progress Notes (Signed)
Subjective:   Julie Perez is a 72 y.o. female who presents for Medicare Annual (Subsequent) preventive examination.  Review of Systems:  N/A Cardiac Risk Factors include: advanced age (>61men, >36 women);dyslipidemia;hypertension;sedentary lifestyle;obesity (BMI >30kg/m2)     Objective:     Vitals: BP 106/64 (BP Location: Left Arm, Patient Position: Sitting)   Pulse (!) 56   Temp 98.4 F (36.9 C) (Oral)   Ht 5\' 6"  (1.676 m)   Wt 219 lb (99.3 kg)   SpO2 95%   BMI 35.35 kg/m   Body mass index is 35.35 kg/m.  Advanced Directives 08/23/2018 05/20/2017 01/29/2017  Does Patient Have a Medical Advance Directive? No Yes Yes  Type of Advance Directive - Living will;Healthcare Power of [place]  Does patient want to make changes to medical advance directive? - No - Patient declined -  Copy of [place] in Chart? - No - copy requested -  Would patient like information on creating a medical advance directive? Yes (MAU/Ambulatory/Procedural Areas - Information given) - -    Tobacco Social History   Tobacco Use  Smoking Status Never Smoker  Smokeless Tobacco Never Used  Tobacco Comment   smoking cessation materials not required     Counseling given: No Comment: smoking cessation materials not required  Clinical Intake:  Pre-visit preparation completed: Yes  Pain : 0-10 Pain Score: 8  Pain Type: Chronic pain Pain Location: Back   BMI - recorded: 35.35 Nutritional Status: BMI > 30  Obese Nutritional Risks: None(does have a small hemorrhoid with slight pinkish tissue surrounding affected area) Referral placed for pt to be seen by [facility] for evaluation and treatment of condition. Diabetes: No  How often do you need to have someone help you when you read instructions, pamphlets, or other written materials from your doctor or pharmacy?: 1 - Never  Interpreter Needed?: No  Information entered by ::  Idell Pickles, LPN  Past Medical History:  Diagnosis Date  . Frequent headaches    stress  related  . Grade I diastolic dysfunction 0/27/2536   Echo June 2019  . Hyperlipidemia   . Hypertension   . Migraines   . Mild concentric left ventricular hypertrophy 05/17/2018   Echo June 2019  . Thyroid disease    Past Surgical History:  Procedure Laterality Date  . ABDOMINAL HYSTERECTOMY  1979   Family History  Problem Relation Age of Onset  . Hypertension Mother   . Alzheimer's disease Mother   . Cancer Father 17       prostate cancer  . Cancer Sister 23       breast cancer  . Breast cancer Sister 46  . Aneurysm Sister   . Diabetes Brother        type 2  . Heart disease Sister        cabg x 4, tobacco abuse  . Diabetes Sister        type 2   Social History   Socioeconomic History  . Marital status: Married    Spouse name: Rip Harbour  . Number of children: 2  . Years of education: Not on file  . Highest education level: 12th grade  Occupational History  . Occupation: Retired  Scientific laboratory technician  . Financial resource strain: Not hard at all  . Food insecurity:    Worry: Never true    Inability: Never true  . Transportation needs:    Medical: No    Non-medical: No  Tobacco Use  . Smoking status: Never Smoker  . Smokeless tobacco: Never Used  . Tobacco comment: smoking cessation materials not required  Substance and Sexual Activity  . Alcohol use: Yes    Alcohol/week: 1.0 standard drinks    Types: 1 Glasses of wine per week    Comment: rare  . Drug use: No  . Sexual activity: Yes  Lifestyle  . Physical activity:    Days per week: 0 days    Minutes per session: 0 min  . Stress: To some extent  Relationships  . Social connections:    Talks on phone: Patient refused    Gets together: Patient refused    Attends religious service: Patient refused    Active member of club or organization: Patient refused    Attends meetings of clubs or organizations: Patient refused     Relationship status: Married  Other Topics Concern  . Not on file  Social History Narrative  . Not on file    Outpatient Encounter Medications as of 08/23/2018  Medication Sig  . acyclovir (ZOVIRAX) 400 MG tablet Take 1 tablet (400 mg total) by mouth daily.  Marland Kitchen amLODipine (NORVASC) 5 MG tablet Take 1 tablet (5 mg total) by mouth at bedtime.  Marland Kitchen atorvastatin (LIPITOR) 40 MG tablet Take 1 tablet (40 mg total) by mouth at bedtime.  . Cholecalciferol (VITAMIN D3) 1000 units CAPS Take 1 capsule by mouth.  . citalopram (CELEXA) 40 MG tablet TAKE 1 TABLET DAILY  . levothyroxine (SYNTHROID, LEVOTHROID) 112 MCG tablet Take 1 tablet (112 mcg total) by mouth daily before breakfast.  . nystatin-triamcinolone (MYCOLOG II) cream Apply 1 application topically 2 (two) times daily.  Marland Kitchen acetaminophen (TYLENOL) 500 MG tablet Take 1,000 mg by mouth 2 (two) times daily.  Marland Kitchen conjugated estrogens (PREMARIN) vaginal cream Place 1 Applicatorful vaginally daily. FOR 2 WEEKS,  THEN TWICE WEEKLY THEREAFTER (Patient not taking: Reported on 05/02/2018)  . gabapentin (NEURONTIN) 100 MG capsule Take 100 mg by mouth daily.  . meloxicam (MOBIC) 15 MG tablet TAKE 1 TABLET BY MOUTH EVERY DAY  . methocarbamol (ROBAXIN) 500 MG tablet Take 1 tablet (500 mg total) by mouth every 6 (six) hours as needed for muscle spasms.  Marland Kitchen sulfamethoxazole-trimethoprim (BACTRIM DS,SEPTRA DS) 800-160 MG tablet Take 1 tablet by mouth 2 (two) times daily.  . traMADol (ULTRAM) 50 MG tablet Take 1 tablet (50 mg total) by mouth every 6 (six) hours as needed.   No facility-administered encounter medications on file as of 08/23/2018.     Activities of Daily Living In your present state of health, do you have any difficulty performing the following activities: 08/23/2018  Hearing? N  Comment denies hearing aids  Vision? N  Comment denies eyeglasses; previous h/o cataract removal with [facility]  Difficulty concentrating or making decisions? Y    Comment short term memory loss  Walking or climbing stairs? Y  Comment dyspnea, joint and back pain  Dressing or bathing? N  Doing errands, shopping? N  Preparing Food and eating ? N  Comment denies dentures  Using the Toilet? N  In the past six months, have you accidently leaked urine? N  Do you have problems with loss of bowel control? N  Managing your Medications? N  Managing your Finances? N  Housekeeping or managing your Housekeeping? N  Some recent data might be hidden    Patient Care Team: Lada, Satira Anis, MD as PCP - General (Family Medicine) Reche Dixon, PA-C as Consulting  Physician (Orthopedic Surgery)    Assessment:   This is a routine wellness examination for [facility].  Exercise Activities and Dietary recommendations Current Exercise Habits: The patient does not participate in regular exercise at present, Exercise limited by: None identified  Goals    . Exercise 150 min/wk Moderate Activity     Recommend to exercise for at least 150 minutes per week.    . Increase lean proteins     Low carb foods    . Increase physical activity       Fall Risk Fall Risk  08/23/2018 05/02/2018 05/20/2017 07/22/2016 11/28/2014  Falls in the past year? No No No No No  Comment - - - Emmi Telephone Survey: data to providers prior to load -  Risk for fall due to : Other (Comment) - - - -  Risk for fall due to: Comment N/A - - - -   FALL RISK PREVENTION PERTAINING TO THE HOME:  Any stairs in or around the home WITH handrails? Yes  Home free of loose throw rugs in walkways, pet beds, electrical cords, etc? Yes  Adequate lighting in your home to reduce risk of falls? Yes   ASSISTIVE DEVICES UTILIZED TO PREVENT FALLS:  Life alert? No  Use of a cane, walker or w/c? No  Grab bars in the bathroom? No  Shower chair or bench in shower? No  Elevated toilet seat or a handicapped toilet? No   DME ORDERS:  DME order needed?  No   TIMED UP AND GO:  Was the test performed? Yes .   Length of time to ambulate 10 feet: 10 sec.   GAIT:  Appearance of gait: Gait stead-fast and without the use of an assistive device.  Education: Fall risk prevention has been discussed.  Intervention(s) required? No   Depression Screen PHQ 2/9 Scores 08/23/2018 05/02/2018 05/20/2017 01/04/2017  PHQ - 2 Score 0 2 0 0  PHQ- 9 Score 0 11 - -     Cognitive Function MMSE - Mini Mental State Exam 05/20/2017  Orientation to time 5  Orientation to Place 5  Registration 3  Attention/ Calculation 5  Recall 3  Language- name 2 objects 2  Language- repeat 1  Language- follow 3 step command 3  Language- read & follow direction 1  Write a sentence 1  Copy design 1  Total score 30     6CIT Screen 08/23/2018  What Year? 0 points  What month? 0 points  What time? 0 points  Count back from 20 0 points  Months in reverse 0 points  Repeat phrase 0 points  Total Score 0    Immunization History  Administered Date(s) Administered  . Influenza, High Dose Seasonal PF 09/08/2017, 08/19/2018  . Influenza,inj,Quad PF,6+ Mos 09/12/2014, 07/17/2015  . Influenza-Unspecified 08/23/2016, 08/23/2017  . Pneumococcal Conjugate-13 05/20/2017  . Pneumococcal Polysaccharide-23 07/10/2013  . Tdap 05/20/2016  . Zoster 07/06/2014   Qualifies for Shingles Vaccine? Yes  Zostavax completed 07/06/14. Due for Shingrix. Education has been provided regarding the importance of this vaccine. Pt has been advised to call insurance company to determine out of pocket expense. Advised may also receive vaccine at local pharmacy or Health Dept. Verbalized acceptance and understanding.  Tdap: Up to date  Flu Vaccine: Up to date  Pneumococcal Vaccine: Up to date  Screening Tests Health Maintenance  Topic Date Due  . MAMMOGRAM  06/16/2019  . COLONOSCOPY  02/22/2023  . TETANUS/TDAP  05/20/2026  . INFLUENZA VACCINE  Completed  .  DEXA SCAN  Completed  . Hepatitis C Screening  Completed  . PNA vac Low Risk Adult   Completed    Cancer Screenings:  Colorectal Screening: Completed 02/21/13. Repeat every 10 years  Mammogram: Completed 06/15/18. Repeat every year  Bone Density: Completed 02/16/17. Results reflect NORMAL. Repeat every 2 years.  Lung Cancer Screening: (Low Dose CT Chest recommended if Age 73-80 years, 30 pack-year currently smoking OR have quit w/in 15years.) does not qualify.   Additional Screening:  Hepatitis C Screening: Completed 05/20/16  Vision Screening: Recommended annual ophthalmology exams for early detection of glaucoma and other disorders of the eye. Is the patient up to date with their annual eye exam?  No  Who is the provider or what is the name of the office in which the pt attends annual eye exams? Not established with a provider If pt is not established with a provider, would they like to be referred to a provider to establish care? Yes . Ophthalmology referral has been placed for pt to be seen by [facility]. Pt has [employer] and can only be seen by [facility]. Referral notes indicate this as well. Pt aware the office will call re: appt.  Dental Screening: Recommended annual dental exams for proper oral hygiene  Community Resource Referral:  CRR required this visit?  No     Plan:  I have personally reviewed and addressed the Medicare Annual Wellness questionnaire and have noted the following in the patient's chart:  A. Medical and social history B. Use of alcohol, tobacco or illicit drugs  C. Current medications and supplements D. Functional ability and status E.  Nutritional status F.  Physical activity G. Advance directives H. List of other physicians I.  Hospitalizations, surgeries, and ER visits in previous 12 months J.  [place] such as hearing and vision if needed, cognitive and depression L. Referrals and appointments  In addition, I have reviewed and discussed with patient certain preventive protocols, quality  metrics, and best practice recommendations. A written personalized care plan for preventive services as well as general preventive health recommendations were provided to patient.  See attached scanned questionnaire for additional information.   Signed,  Aleatha Borer, LPN Nurse Health Advisor

## 2018-08-29 ENCOUNTER — Ambulatory Visit: Payer: Medicare HMO | Admitting: Family Medicine

## 2018-08-29 ENCOUNTER — Encounter: Payer: Self-pay | Admitting: Family Medicine

## 2018-08-29 VITALS — BP 120/72 | HR 63 | Temp 98.8°F | Resp 16 | Ht 66.0 in | Wt 216.5 lb

## 2018-08-29 DIAGNOSIS — R319 Hematuria, unspecified: Secondary | ICD-10-CM

## 2018-08-29 DIAGNOSIS — R300 Dysuria: Secondary | ICD-10-CM

## 2018-08-29 LAB — POCT URINALYSIS DIPSTICK
Bilirubin, UA: NEGATIVE
Glucose, UA: NEGATIVE
NITRITE UA: NEGATIVE
PROTEIN UA: POSITIVE — AB
Spec Grav, UA: 1.025 (ref 1.010–1.025)
Urobilinogen, UA: 2 E.U./dL — AB
pH, UA: 6 (ref 5.0–8.0)

## 2018-08-29 NOTE — Progress Notes (Signed)
Name: Julie Perez   MRN: [id number]    DOB: 29-Dec-1945   Date:08/29/2018       Progress Note  Subjective  Chief Complaint  Chief Complaint  Patient presents with  . Urinary Tract Infection    burning for 3 weeks    HPI  Pt presents with concern for possible UTI - she has been having dysuria for about 3 weeks.  She enodrses nocturia that has been ongoing for several months - gets up to urinate about 3-4 times a night; denies urinary frequency during the day.  No flank/back/abdominal pain, or history of kidney stone; no nausea or vomiting. +Protein, leuks, and blood in UA today.  She notes recurrent UTI's since turning 71yo.  She notes this episode feels very similar to her previous UTI's.  Patient Active Problem List   Diagnosis Date Noted  . Mild concentric left ventricular hypertrophy 05/17/2018  . Grade I diastolic dysfunction 40/98/1191  . Encounter for preventive health examination 07/05/2017  . Right lumbar radiculopathy 07/05/2017  . 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  . Hypokalemia 05/04/2017  . Obesity (BMI 30.0-34.9) 01/05/2017  . Dysuria 06/18/2016  . Fatigue 05/26/2015  . Cardiomegaly 04/20/2015  . Essential hypertension 04/20/2015  . Long-term use of high-risk medication 11/19/2014  . Cephalalgia 10/02/2014  . Postmenopausal atrophic vaginitis 09/15/2014  . Encounter for Medicare annual wellness exam 09/15/2014  . History of candidiasis 09/15/2014  . Generalized anxiety disorder 09/15/2014  . S/P hysterectomy 09/12/2014  . B12 deficiency 06/22/2014  . Vitamin D deficiency 06/22/2014  . OSA (obstructive sleep apnea) 06/22/2014  . 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 Hyperlipidemia LDL goal <130 04/09/2008    Social History   Tobacco Use  . Smoking status: Never Smoker  . Smokeless tobacco: Never Used  . Tobacco comment: smoking cessation materials not required  Substance Use Topics  . Alcohol use: Yes   Alcohol/week: 1.0 standard drinks    Types: 1 Glasses of wine per week    Comment: rare     Current Outpatient Medications:  .  acetaminophen (TYLENOL) 500 MG tablet, Take 1,000 mg by mouth 2 (two) times daily., Disp: , Rfl:  .  acyclovir (ZOVIRAX) 400 MG tablet, Take 1 tablet (400 mg total) by mouth daily., Disp: 90 tablet, Rfl: 3 .  amLODipine (NORVASC) 5 MG tablet, Take 1 tablet (5 mg total) by mouth at bedtime., Disp: 90 tablet, Rfl: 1 .  atorvastatin (LIPITOR) 40 MG tablet, Take 1 tablet (40 mg total) by mouth at bedtime., Disp: 90 tablet, Rfl: 1 .  Cholecalciferol (VITAMIN D3) 1000 units CAPS, Take 1 capsule by mouth., Disp: , Rfl:  .  citalopram (CELEXA) 40 MG tablet, TAKE 1 TABLET DAILY, Disp: 90 tablet, Rfl: 1 .  gabapentin (NEURONTIN) 100 MG capsule, Take 100 mg by mouth daily., Disp: , Rfl: 1 .  levothyroxine (SYNTHROID, LEVOTHROID) 112 MCG tablet, Take 1 tablet (112 mcg total) by mouth daily before breakfast., Disp: 90 tablet, Rfl: 1 .  meloxicam (MOBIC) 15 MG tablet, TAKE 1 TABLET BY MOUTH EVERY DAY, Disp: 90 tablet, Rfl: 1 .  methocarbamol (ROBAXIN) 500 MG tablet, Take 1 tablet (500 mg total) by mouth every 6 (six) hours as needed for muscle spasms., Disp: 120 tablet, Rfl: 2 .  nystatin-triamcinolone (MYCOLOG II) cream, Apply 1 application topically 2 (two) times daily., Disp: 30 g, Rfl: 2 .  conjugated estrogens (PREMARIN) vaginal cream, Place 1 Applicatorful vaginally daily. FOR 2 WEEKS,  THEN TWICE  WEEKLY THEREAFTER (Patient not taking: Reported on 05/02/2018), Disp: 42.5 g, Rfl: 12 .  sulfamethoxazole-trimethoprim (BACTRIM DS,SEPTRA DS) 800-160 MG tablet, Take 1 tablet by mouth 2 (two) times daily. (Patient not taking: Reported on 08/29/2018), Disp: 14 tablet, Rfl: 0 .  traMADol (ULTRAM) 50 MG tablet, Take 1 tablet (50 mg total) by mouth every 6 (six) hours as needed. (Patient not taking: Reported on 08/29/2018), Disp: 30 tablet, Rfl: 0  No Known Allergies  I personally  reviewed active problem list, medication list, allergies, lab results with the patient/caregiver today.  ROS  Ten systems reviewed and is negative except as mentioned in HPI  Objective  Vitals:   08/29/18 1342  BP: 120/72  Pulse: 63  Resp: 16  Temp: 98.8 F (37.1 C)  TempSrc: Oral  SpO2: 97%  Weight: 216 lb 8 oz (98.2 kg)  Height: 5\' 6"  (1.676 m)    Body mass index is 34.94 kg/m.  Nursing Note and Vital Signs reviewed.  Physical Exam  Constitutional: Patient appears well-developed and well-nourished. Obese No distress.  HEENT: head atraumatic, normocephalic Cardiovascular: Normal rate, regular rhythm and normal heart sounds.  No murmur heard. No BLE edema. Pulmonary/Chest: Effort normal and breath sounds clear bilaterally. No respiratory distress. Abdominal: Soft, bowel sounds normal, there is no tenderness, no HSM, no CVA tenderness Psychiatric: Patient has a normal mood and affect. behavior is normal. Judgment and thought content normal.   No results found for this or any previous visit (from the past 72 hour(s)).  Assessment & Plan  1. Dysuria - POCT urinalysis dipstick - Urine Culture - Ambulatory referral to Urology  2. Hematuria, unspecified type - Urine Culture - Ambulatory referral to Urology  - Reviewed most recent urine culture (negative for infection) by PCP Dr. Sanda Klein in July 2019 - she recommends Urology referral if persistent symptoms.  We will hold off on Abx today and await urine culture; will also refer to urology per Dr. Delight Ovens recommendation.   -Red flags and when to present for emergency care or RTC including fever >101.53F, chest pain, shortness of breath, new/worsening/un-resolving symptoms, flank pain, abdominal pain, back pain, N/V, frank hematuria reviewed with patient at time of visit. Follow up and care instructions discussed and provided in AVS.

## 2018-08-30 LAB — URINE CULTURE
MICRO NUMBER:: 91203538
SPECIMEN QUALITY: ADEQUATE

## 2018-09-05 ENCOUNTER — Other Ambulatory Visit: Payer: Self-pay

## 2018-09-05 ENCOUNTER — Ambulatory Visit (INDEPENDENT_AMBULATORY_CARE_PROVIDER_SITE_OTHER): Payer: Medicare HMO | Admitting: Surgery

## 2018-09-05 ENCOUNTER — Encounter: Payer: Self-pay | Admitting: Surgery

## 2018-09-05 VITALS — BP 134/80 | HR 63 | Temp 97.3°F | Resp 12 | Ht 66.0 in | Wt 217.0 lb

## 2018-09-05 DIAGNOSIS — K602 Anal fissure, unspecified: Secondary | ICD-10-CM | POA: Diagnosis not present

## 2018-09-05 NOTE — Patient Instructions (Addendum)
Pick up your medication at the pharmacy listed below.  [facility] [address] [place], [place] [address] [phone]     Patient has been informed to return to the office in 3 weeks. She is to call with any questions or concerns. How to Take a Sitz Bath A sitz bath is a warm water bath that is taken while you are sitting down. The water should only come up to your hips and should cover your buttocks. Your health care provider may recommend a sitz bath to help you:  Clean the lower part of your body, including your genital area.  With itching.  With pain.  With sore muscles or muscles that tighten or spasm.  How to take a sitz bath Take 3-4 sitz baths per day or as told by your health care provider. 1. Partially fill a bathtub with warm water. You will only need the water to be deep enough to cover your hips and buttocks when you are sitting in it. 2. If your health care provider told you to put medicine in the water, follow the directions exactly. 3. Sit in the water and open the tub drain a little. 4. Turn on the warm water again to keep the tub at the correct level. Keep the water running constantly. 5. Soak in the water for 15-20 minutes or as told by your health care provider. 6. After the sitz bath, pat the affected area dry first. Do not rub it. 7. Be careful when you stand up after the sitz bath because you may feel dizzy.  Contact a health care provider if:  Your symptoms get worse. Do not continue with sitz baths if your symptoms get worse.  You have new symptoms. Do not continue with sitz baths until you talk with your health care provider. This information is not intended to replace advice given to you by your health care provider. Make sure you discuss any questions you have with your health care provider. Document Released: 08/01/2004 Document Revised: 04/08/2016 Document Reviewed: 11/07/2014 Elsevier Interactive Patient Education  2018 [place]  Fistula An anal fistula is an abnormal tunnel that develops between the bowel and the skin near the outside of the anus, where stool (feces) comes out. The anus has many tiny glands that make lubricating fluid. Sometimes, these glands become plugged and infected, and that can cause a fluid-filled pocket (abscess) to form. An anal fistula often develops after this infection or abscess. What are the causes? In most cases, an anal fistula is caused by a past or current anal abscess. Other causes include:  A complication of surgery.  Trauma to the rectal area.  Radiation to the area.  Medical conditions or diseases, such as: ? Chronic inflammatory bowel disease, such as Crohn disease or ulcerative colitis. ? Colon cancer or rectal cancer. ? Diverticular disease, such as diverticulitis. ? An STD (sexually transmitted disease), such as gonorrhea, chlamydia, or syphilis. ? An infection that is caused by HIV (human immunodeficiency virus). ? Foreign body in the rectum.  What are the signs or symptoms? Symptoms of this condition include:  Throbbing or constant pain that may be worse while you are sitting.  Swelling or irritation around the anus.  Drainage of pus or blood from an opening near the anus.  Pain with bowel movements.  Fever or chills.  How is this diagnosed? Your health care provider will examine the area to find the openings of the anal fistula and the fistula tract. The  external opening of the anal fistula may be seen during a physical exam. You may also have tests, including:  An exam of the rectal area with a gloved hand (digital rectal exam).  An exam with a probe or scope to help locate the internal opening of the fistula.  Imaging tests to find the exact location and path of the fistula. These tests may include X-rays, an ultrasound, a CT scan, or MRI. The path is made visible by a dye that is injected into the fistula opening.  You may have other tests to find the  cause of the anal fistula. How is this treated? The most common treatment for an anal fistula is surgery. The type of surgery that is used will depend on where the fistula is located and how complex the fistula is. Surgical options include:  A fistulotomy. The whole fistula is opened up, and the contents are drained to promote healing.  Seton placement. A silk string (seton) is placed into the fistula during a fistulotomy. This helps to drain any infection to promote healing.  Advancement flap procedure. Tissue is removed from your rectum or the skin around the anus and is attached to the opening of the fistula.  Bioprosthetic plug. A cone-shaped plug is made from your tissue and is used to block the opening of the fistula.  Some anal fistulas do not require surgery. A nonsurgical treatment option involves injecting a fibrin glue to seal the fistula. You also may be prescribed an antibiotic medicine to treat an infection. Follow these instructions at home: Medicines  Take over-the-counter and prescription medicines only as told by your health care provider.  If you were prescribed an antibiotic medicine, take it as told by your health care provider. Do not stop taking the antibiotic even if you start to feel better.  Use a stool softener or a laxative if told to do so by your health care provider. General instructions  Eat a high-fiber diet as told by your health care provider. This can help to prevent constipation.  Drink enough fluid to keep your urine clear or pale yellow.  Take a warm sitz bath for 15-20 minutes, 3-4 times per day, or as told by your health care provider. Sitz baths can ease your pain and discomfort and help with healing.  Follow good hygiene to keep the anal area as clean and dry as possible. Use wet toilet paper or a moist towelette after each bowel movement.  Keep all follow-up visits as told by your health care provider. This is important. Contact a health  care provider if:  You have increased pain that is not controlled with medicines.  You have new redness or swelling around the anal area.  You have new fluid, blood, or pus coming from the anal area.  You have tenderness or warmth around the anal area. Get help right away if:  You have a fever.  You have severe pain.  You have chills or diarrhea.  You have severe problems urinating or having a bowel movement. This information is not intended to replace advice given to you by your health care provider. Make sure you discuss any questions you have with your health care provider. Document Released: 10/22/2008 Document Revised: 04/16/2016 Document Reviewed: 02/04/2015 Elsevier Interactive Patient Education  [employer].

## 2018-09-06 ENCOUNTER — Encounter: Payer: Self-pay | Admitting: Urology

## 2018-09-06 ENCOUNTER — Encounter: Payer: Self-pay | Admitting: Surgery

## 2018-09-06 ENCOUNTER — Ambulatory Visit: Payer: Medicare HMO | Admitting: Urology

## 2018-09-06 VITALS — BP 117/73 | HR 63 | Ht 66.0 in | Wt 217.3 lb

## 2018-09-06 DIAGNOSIS — R3129 Other microscopic hematuria: Secondary | ICD-10-CM

## 2018-09-06 DIAGNOSIS — N952 Postmenopausal atrophic vaginitis: Secondary | ICD-10-CM | POA: Diagnosis not present

## 2018-09-06 DIAGNOSIS — R300 Dysuria: Secondary | ICD-10-CM

## 2018-09-06 LAB — URINALYSIS, COMPLETE
Bilirubin, UA: NEGATIVE
GLUCOSE, UA: NEGATIVE
KETONES UA: NEGATIVE
NITRITE UA: NEGATIVE
Protein, UA: NEGATIVE
SPEC GRAV UA: 1.02 (ref 1.005–1.030)
UUROB: 0.2 mg/dL (ref 0.2–1.0)
pH, UA: 6 (ref 5.0–7.5)

## 2018-09-06 LAB — MICROSCOPIC EXAMINATION

## 2018-09-06 MED ORDER — ESTRADIOL 0.1 MG/GM VA CREA: TOPICAL_CREAM | VAGINAL | 12 refills | Status: DC

## 2018-09-06 MED ORDER — ESTROGENS, CONJUGATED 0.625 MG/GM VA CREA: TOPICAL_CREAM | VAGINAL | 12 refills | Status: DC

## 2018-09-06 NOTE — Progress Notes (Signed)
Patient ID: Julie Perez, female   DOB: 09-Jun-1946, 72 y.o.   MRN: [id number]  HPI Julie Perez is a 72 y.o. female seen for anorectal pain.  She reports that she has been having some anorectal pain intermittently for the last few months.  She felt like a burning sensation that is exacerbated when she is constipated and has a bowel movement.  No fevers no chills no weight loss.  She does have some occasional hematochezia.  Had a recent colonoscopy with no major abnormalities.  He has placed on which hazel that apparently seem to have helped. SHe is able to perform more than 4 METS of activity without any shortness of breath or chest pain BC and CMP were normal  HPI  Past Medical History:  Diagnosis Date  . Arthritis   . Frequent headaches    stress  related  . Grade I diastolic dysfunction 8/34/1962   Echo June 2019  . High cholesterol   . Hyperlipidemia   . Hypertension   . Migraines   . Mild concentric left ventricular hypertrophy 05/17/2018   Echo June 2019  . Thyroid disease     Past Surgical History:  Procedure Laterality Date  . ABDOMINAL HYSTERECTOMY  1979  . VAGINAL HYSTERECTOMY  1979    Family History  Problem Relation Age of Onset  . Hypertension Mother   . Alzheimer's disease Mother   . Cancer Father 28       prostate cancer  . Cancer Sister 56       breast cancer  . Breast cancer Sister 68  . Aneurysm Sister   . Diabetes Brother        type 2  . Heart disease Sister        cabg x 4, tobacco abuse  . Diabetes Sister        type 2    Social History Social History   Tobacco Use  . Smoking status: Never Smoker  . Smokeless tobacco: Never Used  . Tobacco comment: smoking cessation materials not required  Substance Use Topics  . Alcohol use: Yes    Alcohol/week: 1.0 standard drinks    Types: 1 Glasses of wine per week    Comment: rare  . Drug use: No    No Known Allergies  Current Outpatient Medications  Medication Sig Dispense Refill  .  acyclovir (ZOVIRAX) 400 MG tablet Take 1 tablet (400 mg total) by mouth daily. 90 tablet 3  . amLODipine (NORVASC) 5 MG tablet Take 1 tablet (5 mg total) by mouth at bedtime. 90 tablet 1  . atorvastatin (LIPITOR) 40 MG tablet Take 1 tablet (40 mg total) by mouth at bedtime. 90 tablet 1  . citalopram (CELEXA) 40 MG tablet TAKE 1 TABLET DAILY 90 tablet 1  . levothyroxine (SYNTHROID, LEVOTHROID) 112 MCG tablet Take 1 tablet (112 mcg total) by mouth daily before breakfast. 90 tablet 1  . nystatin-triamcinolone (MYCOLOG II) cream Apply 1 application topically 2 (two) times daily. 30 g 2  . conjugated estrogens (PREMARIN) vaginal cream Apply 0.5mg  (pea-sized amount)  just inside the vaginal introitus with a finger-tip on  Monday, Wednesday and Friday nights. 30 g 12  . estradiol (ESTRACE VAGINAL) 0.1 MG/GM vaginal cream Apply 0.5mg  (pea-sized amount)  just inside the vaginal introitus with a finger-tip on Monday, Wednesday and Friday nights. 30 g 12   No current facility-administered medications for this visit.      Review of Systems Full ROS  was asked  and was negative except for the information on the HPI  Physical Exam Blood pressure 134/80, pulse 63, temperature (!) 97.3 F (36.3 C), temperature source Temporal, resp. rate 12, height 5\' 6"  (1.676 m), weight 217 lb (98.4 kg), SpO2 97 %. CONSTITUTIONAL: NAD  LYMPH NODES:  Lymph nodes in the neck are normal. RESPIRATORY:  Lungs are clear. There is normal respiratory effort, with equal breath sounds bilaterally, and without pathologic use of accessory muscles. CARDIOVASCULAR: Heart is regular without murmurs, gallops, or rubs. GI: The abdomen is soft, nontender, and nondistended. There are no palpable masses. There is no hepatosplenomegaly. There are normal bowel sounds in all quadrants.  Rectal: skin tags, there is thickening on anterolateral aspect and tenderness c/w fissure. No abscess or masses. No significant hemorrhoids.   MUSCULOSKELETAL:  Normal muscle strength and tone. No cyanosis or edema.   SKIN: Turgor is good and there are no pathologic skin lesions or ulcers. NEUROLOGIC: Motor and sensation is grossly normal. Cranial nerves are grossly intact. PSYCH:  Oriented to person, place and time. Affect is normal.  Data Reviewed  I have personally reviewed the patient's imaging, laboratory findings and medical records.    Assessment/Plan Anal fissure.  Discussed with patient in detail about her disease process.  Recommend medical therapy with sitz baths, high-fiber diet, nifedipine cream twice a day.  She may require a chemical sphincterotomy but we will start with nifedipine and medical treatment.  No need for any emergent surgical interventions at this time. RTC 3 weeks   Caroleen Hamman, MD FACS General Surgeon 09/06/2018, 10:37 AM

## 2018-09-06 NOTE — Patient Instructions (Addendum)
I have given you two prescriptions for a vaginal estrogen cream.  Estrace and Premarin.  Please take these to your pharmacy and see which one your insurance covers.  If both are too expensive, please call the office at [phone] for an alternative.  You are given a sample of vaginal estrogen cream Premarin and instructed to apply 0.5mg  (pea-sized amount)  just inside the vaginal introitus with a finger-tip on Monday, Wednesday and Friday nights,    Atrophic Vaginitis Atrophic vaginitis is a condition in which the tissues that line the vagina become dry and thin. This condition is most common in women who have stopped having regular menstrual periods (menopause). This usually starts when a woman is 75-11 years old. Estrogen helps to keep the vagina moist. It stimulates the vagina to produce a clear fluid that lubricates the vagina for sexual intercourse. This fluid also protects the vagina from infection. Lack of estrogen can cause the lining of the vagina to get thinner and dryer. The vagina may also shrink in size. It may become less elastic. Atrophic vaginitis tends to get worse over time as a woman's estrogen level drops. What are the causes? This condition is caused by the normal drop in estrogen that happens around the time of menopause. What increases the risk? Certain conditions or situations may lower a woman's estrogen level, which increases her risk of atrophic vaginitis. These include:  Taking medicine that blocks estrogen.  Having ovaries removed surgically.  Being treated for cancer with X-ray treatment (radiation) or medicines (chemotherapy).  Exercising very hard and often.  Having an eating disorder (anorexia).  Giving birth or breastfeeding.  Being over the age of 71.  Smoking.  What are the signs or symptoms? Symptoms of this condition include:  Pain, soreness, or bleeding during sexual intercourse (dyspareunia).  Vaginal burning, irritation, or itching.  Pain  or bleeding during a vaginal examination using a speculum (pelvic exam).  Loss of interest in sexual activity.  Having burning pain when passing urine.  Vaginal discharge that is brown or yellow.  In some cases, there are no symptoms. How is this diagnosed? This condition is diagnosed with a medical history and physical exam. This will include a pelvic exam that checks whether the inside of your vagina appears pale, thin, or dry. Rarely, you may also have other tests, including:  A urine test.  A test that checks the acid balance in your vaginal fluid (acid balance test).  How is this treated? Treatment for this condition may depend on the severity of your symptoms. Treatment may include:  Using an over-the-counter vaginal lubricant before you have sexual intercourse.  Using a long-acting vaginal moisturizer.  Using low-dose vaginal estrogen for moderate to severe symptoms that do not respond to other treatments. Options include creams, tablets, and inserts (vaginal rings). Before using vaginal estrogen, tell your health care provider if you have a history of: ? Breast cancer. ? Endometrial cancer. ? Blood clots.  Taking medicines. You may be able to take a daily pill for dyspareunia. Discuss all of the risks of this medicine with your health care provider. It is usually not recommended for women who have a family history or personal history of breast cancer.  If your symptoms are very mild and you are not sexually active, you may not need treatment. Follow these instructions at home:  Take medicines only as directed by your health care provider. Do not use herbal or alternative medicines unless your health care provider says that  you can.  Use over-the-counter creams, lubricants, or moisturizers for dryness only as directed by your health care provider.  If your atrophic vaginitis is caused by menopause, discuss all of your menopausal symptoms and treatment options with your  health care provider.  Do not douche.  Do not use products that can make your vagina dry. These include: ? Scented feminine sprays. ? Scented tampons. ? Scented soaps.  If it hurts to have sex, talk with your sexual partner. Contact a health care provider if:  Your discharge looks different than normal.  Your vagina has an unusual smell.  You have new symptoms.  Your symptoms do not improve with treatment.  Your symptoms get worse. This information is not intended to replace advice given to you by your health care provider. Make sure you discuss any questions you have with your health care provider. Document Released: 03/26/2015 Document Revised: 04/16/2016 Document Reviewed: 10/31/2014 Elsevier Interactive Patient Education  2018 [employer].  Hematuria, Adult Hematuria is blood in your urine. It can be caused by a bladder infection, kidney infection, prostate infection, kidney stone, or cancer of your urinary tract. Infections can usually be treated with medicine, and a kidney stone usually will pass through your urine. If neither of these is the cause of your hematuria, further workup to find out the reason may be needed. It is very important that you tell your health care provider about any blood you see in your urine, even if the blood stops without treatment or happens without causing pain. Blood in your urine that happens and then stops and then happens again can be a symptom of a very serious condition. Also, pain is not a symptom in the initial stages of many urinary cancers. Follow these instructions at home:  Drink lots of fluid, 3-4 quarts a day. If you have been diagnosed with an infection, cranberry juice is especially recommended, in addition to large amounts of water.  Avoid caffeine, tea, and carbonated beverages because they tend to irritate the bladder.  Avoid alcohol because it may irritate the prostate.  Take all medicines as directed by your health care  provider.  If you were prescribed an antibiotic medicine, finish it all even if you start to feel better.  If you have been diagnosed with a kidney stone, follow your health care provider's instructions regarding straining your urine to catch the stone.  Empty your bladder often. Avoid holding urine for long periods of time.  After a bowel movement, women should cleanse front to back. Use each tissue only once.  Empty your bladder before and after sexual intercourse if you are a female. Contact a health care provider if:  You develop back pain.  You have a fever.  You have a feeling of sickness in your stomach (nausea) or vomiting.  Your symptoms are not better in 3 days. Return sooner if you are getting worse. Get help right away if:  You develop severe vomiting and are unable to keep the medicine down.  You develop severe back or abdominal pain despite taking your medicines.  You begin passing a large amount of blood or clots in your urine.  You feel extremely weak or faint, or you pass out. This information is not intended to replace advice given to you by your health care provider. Make sure you discuss any questions you have with your health care provider. Document Released: 11/09/2005 Document Revised: 04/16/2016 Document Reviewed: 07/10/2013 Elsevier Interactive Patient Education  2017 [employer].  CT Scan A CT scan (computed tomography scan) is an imaging scan. It uses X-rays and a computer to make detailed pictures of different areas inside the body. A CT scan can give more information than a regular X-ray exam. A CT scan provides data about internal organs, soft tissue structures, blood vessels, and bones. In this procedure, the pictures will be taken in a large machine that has an opening (CT scanner). Tell a health care provider about:  Any allergies you have.  All medicines you are taking, including vitamins, herbs, eye drops, creams, and over-the-counter  medicines.  Any blood disorders you have.  Any surgeries you have had.  Any medical conditions you have.  Whether you are pregnant or may be pregnant. What are the risks? Generally, this is a safe procedure. However, problems may occur, including:  An allergic reaction to dyes.  Development of cancer from excessive exposure to radiation from multiple CT scans. This is rare.  What happens before the procedure? Staying hydrated Follow instructions from your health care provider about hydration, which may include:  Up to 2 hours before the procedure - you may continue to drink clear liquids, such as water, clear fruit juice, black coffee, and plain tea.  Eating and drinking restrictions Follow instructions from your health care provider about eating and drinking, which may include:  24 hours before the procedure - stop drinking caffeinated beverages, such as energy drinks, tea, soda, coffee, and hot chocolate.  8 hours before the procedure - stop eating heavy meals or foods such as meat, fried foods, or fatty foods.  6 hours before the procedure - stop eating light meals or foods, such as toast or cereal.  6 hours before the procedure - stop drinking milk or drinks that contain milk.  2 hours before the procedure - stop drinking clear liquids.  General instructions  Remove any jewelry.  Ask your health care provider about changing or stopping your regular medicines. This is especially important if you are taking diabetes medicines or blood thinners. What happens during the procedure?  You will lie on a table with your arms above your head.  An IV tube may be inserted into one of your veins.  The contrast dye may be injected into the IV tube. You may feel warm or have a metallic taste in your mouth.  The table you will be lying on will move into the CT scanner.  You will be able to see, hear, and talk to the person running the machine while you are in it. Follow that  person's instructions.  The CT scanner will move around you to take pictures. Do not move while it is scanning. Staying still helps the scanner to get a good image.  When the best possible pictures have been taken, the machine will be turned off. The table will be moved out of the machine.  The IV tube will be removed. The procedure may vary among health care providers and hospitals. What happens after the procedure?  It is up to you to get the results of your procedure. Ask your health care provider, or the department that is doing the procedure, when your results will be ready. Summary  A CT scan is an imaging scan.  A CT scan uses X-rays and a computer to make detailed pictures of different areas of your body.  Follow instructions from your health care provider about eating and drinking before the procedure.  You will be able to see, hear, and  talk to the person running the machine while you are in it. Follow that person's instructions. This information is not intended to replace advice given to you by your health care provider. Make sure you discuss any questions you have with your health care provider. Document Released: 12/17/2004 Document Revised: 12/12/2016 Document Reviewed: 12/12/2016 Elsevier Interactive Patient Education  2018 [employer].  Cystoscopy Cystoscopy is a procedure that is used to help diagnose and sometimes treat conditions that affect that lower urinary tract. The lower urinary tract includes the bladder and the tube that drains urine from the bladder out of the body (urethra). Cystoscopy is performed with a thin, tube-shaped instrument with a light and camera at the end (cystoscope). The cystoscope may be hard (rigid) or flexible, depending on the goal of the procedure.The cystoscope is inserted through the urethra, into the bladder. Cystoscopy may be recommended if you have:  Urinary tractinfections that keep coming back (recurring).  Blood in the urine  (hematuria).  Loss of bladder control (urinary incontinence) or an overactive bladder.  Unusual cells found in a urine sample.  A blockage in the urethra.  Painful urination.  An abnormality in the bladder found during an intravenous pyelogram (IVP) or CT scan.  Cystoscopy may also be done to remove a sample of tissue to be examined under a microscope (biopsy). Tell a health care provider about:  Any allergies you have.  All medicines you are taking, including vitamins, herbs, eye drops, creams, and over-the-counter medicines.  Any problems you or family members have had with anesthetic medicines.  Any blood disorders you have.  Any surgeries you have had.  Any medical conditions you have.  Whether you are pregnant or may be pregnant. What are the risks? Generally, this is a safe procedure. However, problems may occur, including:  Infection.  Bleeding.  Allergic reactions to medicines.  Damage to other structures or organs.  What happens before the procedure?  Ask your health care provider about: ? Changing or stopping your regular medicines. This is especially important if you are taking diabetes medicines or blood thinners. ? Taking medicines such as aspirin and ibuprofen. These medicines can thin your blood. Do not take these medicines before your procedure if your health care provider instructs you not to.  Follow instructions from your health care provider about eating or drinking restrictions.  You may be given antibiotic medicine to help prevent infection.  You may have an exam or testing, such as X-rays of the bladder, urethra, or kidneys.  You may have urine tests to check for signs of infection.  Plan to have someone take you home after the procedure. What happens during the procedure?  To reduce your risk of infection,your health care team will wash or sanitize their hands.  You will be given one or more of the following: ? A medicine to help you  relax (sedative). ? A medicine to numb the area (local anesthetic).  The area around the opening of your urethra will be cleaned.  The cystoscope will be passed through your urethra into your bladder.  Germ-free (sterile)fluid will flow through the cystoscope to fill your bladder. The fluid will stretch your bladder so that your surgeon can clearly examine your bladder walls.  The cystoscope will be removed and your bladder will be emptied. The procedure may vary among health care providers and hospitals. What happens after the procedure?  You may have some soreness or pain in your abdomen and urethra. Medicines will be available  to help you.  You may have some blood in your urine.  Do not drive for 24 hours if you received a sedative. This information is not intended to replace advice given to you by your health care provider. Make sure you discuss any questions you have with your health care provider. Document Released: 11/06/2000 Document Revised: 03/19/2016 Document Reviewed: 09/26/2015 Elsevier Interactive Patient Education  [employer].

## 2018-09-06 NOTE — Progress Notes (Signed)
09/06/2018 10:08 AM   Baker Pierini Apr 07, 1946 [id number]  Referring provider: Arnetha Courser, MD [address] [place], [place] [address]  Chief Complaint  Patient presents with  . Dysuria    HPI: Patient is a 72 -year-old Caucasian female who presents today as a referral from their Dr. Arnetha Courser for dysuria and microscopic hematuria.    Patient was found to have microscpic hematuria on 05/02/2018 with 6-10 RBC's/hpf and urine culture was negative.   She does have a prior personal history of recurrent urinary tract infections (no documented positive cultures), but she does have a history of nephrolithiasis, trauma to the genitourinary tract or malignancies of the genitourinary tract.   Her father had prostate cancer and her brother had kidney stones.    Today, she is having symptoms of urgency and nocturia (up continuously).  Patient denies any gross hematuria, dysuria or suprapubic/flank pain.  Patient denies any fevers, chills, nausea or vomiting.  Her UA today demonstrates many bacteria.    She is not a smoker.   She was exposed to secondhand smoke growing up.  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    She has a high BMI.     PMH: Past Medical History:  Diagnosis Date  . Arthritis   . Frequent headaches    stress  related  . Grade I diastolic dysfunction 7/68/1157   Echo June 2019  . High cholesterol   . Hyperlipidemia   . Hypertension   . Migraines   . Mild concentric left ventricular hypertrophy 05/17/2018   Echo June 2019  . Thyroid disease     Surgical History: Past Surgical History:  Procedure Laterality Date  . ABDOMINAL HYSTERECTOMY  1979  . VAGINAL HYSTERECTOMY  1979    Home Medications:  Allergies as of 09/06/2018   No Known Allergies     Medication List        Accurate as of 09/06/18 10:08 AM. Always use your most recent med list.          acyclovir 400 MG tablet Commonly known as:   ZOVIRAX Take 1 tablet (400 mg total) by mouth daily.   amLODipine 5 MG tablet Commonly known as:  NORVASC Take 1 tablet (5 mg total) by mouth at bedtime.   atorvastatin 40 MG tablet Commonly known as:  LIPITOR Take 1 tablet (40 mg total) by mouth at bedtime.   citalopram 40 MG tablet Commonly known as:  CELEXA TAKE 1 TABLET DAILY   conjugated estrogens vaginal cream Commonly known as:  PREMARIN Apply 0.5mg  (pea-sized amount)  just inside the vaginal introitus with a finger-tip on  Monday, Wednesday and Friday nights.   estradiol 0.1 MG/GM vaginal cream Commonly known as:  ESTRACE Apply 0.5mg  (pea-sized amount)  just inside the vaginal introitus with a finger-tip on Monday, Wednesday and Friday nights.   levothyroxine 112 MCG tablet Commonly known as:  SYNTHROID, LEVOTHROID Take 1 tablet (112 mcg total) by mouth daily before breakfast.   nystatin-triamcinolone cream Commonly known as:  MYCOLOG II Apply 1 application topically 2 (two) times daily.       Allergies: No Known Allergies  Family History: Family History  Problem Relation Age of Onset  . Hypertension Mother   . Alzheimer's disease Mother   . Cancer Father 45       prostate cancer  . Cancer Sister 40       breast cancer  . Breast cancer Sister 55  .  Aneurysm Sister   . Diabetes Brother        type 2  . Heart disease Sister        cabg x 4, tobacco abuse  . Diabetes Sister        type 2    Social History:  reports that she has never smoked. She has never used smokeless tobacco. She reports that she drinks about 1.0 standard drinks of alcohol per week. She reports that she does not use drugs.  ROS: UROLOGY Frequent Urination?: Yes Hard to postpone urination?: Yes Burning/pain with urination?: Yes(burn) Get up at night to urinate?: No Leakage of urine?: No Urine stream starts and stops?: No Trouble starting stream?: No Do you have to strain to urinate?: No Blood in urine?: Yes Urinary tract  infection?: No Sexually transmitted disease?: No Injury to kidneys or bladder?: No Painful intercourse?: Yes Weak stream?: No Currently pregnant?: No Vaginal bleeding?: No Last menstrual period?: n  Gastrointestinal Nausea?: No Vomiting?: No Indigestion/heartburn?: No Diarrhea?: No Constipation?: No  Constitutional Fever: No Night sweats?: No Weight loss?: No Fatigue?: Yes  Skin Skin rash/lesions?: No Itching?: No  Eyes Blurred vision?: No Double vision?: No  Ears/Nose/Throat Sore throat?: No Sinus problems?: No  Hematologic/Lymphatic Swollen glands?: No Easy bruising?: No  Cardiovascular Leg swelling?: No Chest pain?: No  Respiratory Cough?: Yes Shortness of breath?: No  Endocrine Excessive thirst?: No  Musculoskeletal Back pain?: Yes Joint pain?: Yes  Neurological Headaches?: No Dizziness?: No  Psychologic Depression?: No Anxiety?: No  Physical Exam: BP 117/73 (BP Location: Left Arm, Patient Position: Sitting, Cuff Size: Normal)   Pulse 63   Ht 5\' 6"  (1.676 m)   Wt 217 lb 4.8 oz (98.6 kg)   BMI 35.07 kg/m   Constitutional:  Well nourished. Alert and oriented, No acute distress. HEENT: [place] AT, moist mucus membranes.  Trachea midline, no masses. Cardiovascular: No clubbing, cyanosis, or edema. Respiratory: Normal respiratory effort, no increased work of breathing. GI: Abdomen is soft, non tender, non distended, no abdominal masses. Liver and spleen not palpable.  No hernias appreciated.  Stool sample for occult testing is not indicated.   GU: No CVA tenderness.  No bladder fullness or masses.  Atrophic external genitalia, normal pubic hair distribution, no lesions.  Normal urethral meatus, no lesions, no prolapse, no discharge.   No urethral masses, tenderness and/or tenderness. No bladder fullness, tenderness or masses. Pale vagina mucosa, poor estrogen effect, no discharge, no lesions, poor pelvic support, grade II cystocele and no rectocele  noted.  Cervix and uterus are surgically absent.  No pelvic massed palpated.  Anus and perineum are without rashes or lesions.    Skin: No rashes, bruises or suspicious lesions. Lymph: No cervical or inguinal adenopathy. Neurologic: Grossly intact, no focal deficits, moving all 4 extremities. Psychiatric: Normal mood and affect.  Laboratory Data: Lab Results  Component Value Date   WBC 7.5 05/02/2018   HGB 14.0 05/02/2018   HCT 40.6 05/02/2018   MCV 87.5 05/02/2018   PLT 250 05/02/2018    Lab Results  Component Value Date   CREATININE 0.72 05/02/2018    No results found for: PSA  No results found for: TESTOSTERONE  Lab Results  Component Value Date   HGBA1C 5.5 01/04/2017    Lab Results  Component Value Date   TSH 3.01 05/02/2018       Component Value Date/Time   CHOL 176 05/02/2018 1451   HDL 54 05/02/2018 1451   CHOLHDL 3.3 05/02/2018  1451   VLDL 33.0 01/04/2017 1109   LDLCALC 96 05/02/2018 1451    Lab Results  Component Value Date   AST 15 05/02/2018   Lab Results  Component Value Date   ALT 13 05/02/2018   No components found for: ALKALINEPHOPHATASE No components found for: BILIRUBINTOTAL  No results found for: ESTRADIOL   Urinalysis Negative.  See Epic.  I have reviewed the labs.   Assessment & Plan:    1. Microscopic hematuria Explained to the patient that there are a number of causes that can be associated with blood in the urine, such as stones, UTI's, damage to the urinary tract and/or cancer. At this time, I felt that the patient warranted further urologic evaluation.   The AUA guidelines state that a CT urogram is the preferred imaging study to evaluate hematuria. I explained to the patient that a contrast material will be injected into a vein and that in rare instances, an allergic reaction can result and may even life threatening   The patient denies any allergies to contrast, iodine and/or seafood and is not taking  metformin. Following the imaging study,  I've recommended a cystoscopy. I described how this is performed, typically in an office setting with a flexible cystoscope. We described the risks, benefits, and possible side effects, the most common of which is a minor amount of blood in the urine and/or burning which usually resolves in 24 to 48 hours.   The patient had the opportunity to ask questions which were answered. Based upon this discussion, the patient is willing to proceed. Therefore, I've ordered: a CT Urogram and cystoscopy.   - The patient will return following all of the above for discussion of the results.   - UA   - Urine culture   - BUN + creatinine    2. Dysuria ? Of vaginal atrophy CTU and cystoscopy are pending  3. Vaginal atrophy Patient was given a sample of vaginal estrogen cream (Premarin vaginal cream) and instructed to apply 0.5mg  (pea-sized amount)  just inside the vaginal introitus with a finger-tip on Monday, Wednesday and Friday nights.  I explained to the patient that vaginally administered estrogen, which causes only a slight increase in the blood estrogen levels, have fewer contraindications and adverse systemic effects that oral HT. I have also given prescriptions for the Estrace cream and Premarin cream, so that the patient may carry them to the pharmacy to see which one of the branded creams would be most economical for her.  If she finds both medications cost prohibitive, she is instructed to call the office.  We can then call in a compounded vaginal estrogen cream for the patient that may be more affordable.  She will follow up in three months for an exam.    Return for CT Urogram report and cystoscopy.  These notes generated with voice recognition software. I apologize for typographical errors.  Zara Council, PA-C  [facility] [address] [place]  [place], [place] [address] [phone]

## 2018-09-07 LAB — BUN+CREAT
BUN / CREAT RATIO: 21 (ref 12–28)
BUN: 15 mg/dL (ref 8–27)
Creatinine, Ser: 0.73 mg/dL (ref 0.57–1.00)
GFR calc Af Amer: 96 mL/min/{1.73_m2} (ref >59)
GFR calc non Af Amer: 83 mL/min/{1.73_m2} (ref >59)

## 2018-09-12 ENCOUNTER — Telehealth: Payer: Self-pay

## 2018-09-12 DIAGNOSIS — B9689 Other specified bacterial agents as the cause of diseases classified elsewhere: Secondary | ICD-10-CM

## 2018-09-12 DIAGNOSIS — B961 Klebsiella pneumoniae [K. pneumoniae] as the cause of diseases classified elsewhere: Secondary | ICD-10-CM

## 2018-09-12 DIAGNOSIS — N3090 Cystitis, unspecified without hematuria: Secondary | ICD-10-CM

## 2018-09-12 LAB — CULTURE, URINE COMPREHENSIVE

## 2018-09-12 MED ORDER — AMOXICILLIN-POT CLAVULANATE 875-125 MG PO TABS: 1.0000 | ORAL_TABLET | Freq: Two times a day (BID) | ORAL | 0 refills | Status: AC

## 2018-09-12 NOTE — Telephone Encounter (Signed)
Called pt no answer. LM for pt to call office back. RX sent.

## 2018-09-12 NOTE — Telephone Encounter (Signed)
-----   Message from Nori Riis, PA-C sent at 09/12/2018  1:58 PM EDT ----- Please let Mrs. Rothgeb know that she has a positive urine culture.  She needs to start Augmentin 875/125, twice daily for seven days.

## 2018-09-12 NOTE — Telephone Encounter (Signed)
Pt called back and I read message from [facility].

## 2018-09-17 ENCOUNTER — Other Ambulatory Visit: Payer: Self-pay | Admitting: Internal Medicine

## 2018-09-21 ENCOUNTER — Other Ambulatory Visit: Payer: Self-pay

## 2018-09-21 ENCOUNTER — Telehealth: Payer: Self-pay

## 2018-09-21 NOTE — Telephone Encounter (Signed)
Called pharmacy to cancel previous refill  , patient has transferred care spoke with Legrand Como at [facility] and he will cancel script for Citalopram and notate on account.

## 2018-09-26 ENCOUNTER — Encounter: Payer: Self-pay | Admitting: Surgery

## 2018-09-26 ENCOUNTER — Other Ambulatory Visit: Payer: Self-pay

## 2018-09-26 ENCOUNTER — Ambulatory Visit: Payer: Medicare HMO | Admitting: Surgery

## 2018-09-26 VITALS — BP 144/77 | HR 59 | Temp 97.9°F | Ht 66.0 in | Wt 218.0 lb

## 2018-09-26 DIAGNOSIS — K602 Anal fissure, unspecified: Secondary | ICD-10-CM | POA: Diagnosis not present

## 2018-09-26 NOTE — Progress Notes (Signed)
Outpatient Surgical Follow Up  09/26/2018  Julie Perez is an 72 y.o. female.   No chief complaint on file.   HPI: 72 year old female for anal fissure.  She has started nifedipine cream as well as sitz baths with dramatic improvement in symptoms.  She denies any hematochezia or any pain at this point.  She states that the nifedipine cream is a miracle drug. Normal BMs. Taking fiber.  Past Medical History:  Diagnosis Date  . Arthritis   . Frequent headaches    stress  related  . Grade I diastolic dysfunction 0/07/2329   Echo June 2019  . High cholesterol   . Hyperlipidemia   . Hypertension   . Migraines   . Mild concentric left ventricular hypertrophy 05/17/2018   Echo June 2019  . Thyroid disease     Past Surgical History:  Procedure Laterality Date  . ABDOMINAL HYSTERECTOMY  1979  . VAGINAL HYSTERECTOMY  1979    Family History  Problem Relation Age of Onset  . Hypertension Mother   . Alzheimer's disease Mother   . Cancer Father 50       prostate cancer  . Cancer Sister 82       breast cancer  . Breast cancer Sister 5  . Aneurysm Sister   . Diabetes Brother        type 2  . Heart disease Sister        cabg x 4, tobacco abuse  . Diabetes Sister        type 2    Social History:  reports that she has never smoked. She has never used smokeless tobacco. She reports that she drinks about 1.0 standard drinks of alcohol per week. She reports that she does not use drugs.  Allergies: No Known Allergies  Medications reviewed.    ROS Full ROS performed and is otherwise negative other than what is stated in HPI   BP (!) 144/77   Pulse (!) 59   Temp 97.9 F (36.6 C) (Skin)   Ht 5\' 6"  (1.676 m)   Wt 218 lb (98.9 kg)   BMI 35.19 kg/m   Physical Exam  Constitutional: She is oriented to person, place, and time. She appears well-developed and well-nourished.  Abdominal: Soft. She exhibits no distension. There is no tenderness.  Genitourinary:   Genitourinary Comments: Pt wished to defer rectal exam at this time  Neurological: She is alert and oriented to person, place, and time. No cranial nerve deficit. Coordination normal.  Skin: Skin is warm and dry.  Psychiatric: She has a normal mood and affect. Her behavior is normal. Judgment and thought content normal.  Nursing note and vitals reviewed.      Assessment/Plan: No fissure responding to medical treatment.  At this point we will continue the nifedipine cream for at least total of 6 weeks to allow the fissure to heal.  No need for surgical intervention.  No need for chemical sphincterotomy at this time.  She will call us back if needed.     Caroleen Hamman, MD [facility] General Surgeon

## 2018-09-26 NOTE — Patient Instructions (Signed)
Use the cream for  three more weeks. Return as needed.

## 2018-09-28 ENCOUNTER — Ambulatory Visit
Admission: RE | Admit: 2018-09-28 | Discharge: 2018-09-28 | Disposition: A | Discharge status: Home / Self Care | Payer: Medicare HMO | Source: Ambulatory Visit | Attending: Urology | Admitting: Urology

## 2018-09-28 DIAGNOSIS — I2510 Atherosclerotic heart disease of native coronary artery without angina pectoris: Secondary | ICD-10-CM | POA: Diagnosis not present

## 2018-09-28 DIAGNOSIS — I700 Atherosclerosis of aorta: Secondary | ICD-10-CM | POA: Diagnosis not present

## 2018-09-28 DIAGNOSIS — R3129 Other microscopic hematuria: Secondary | ICD-10-CM | POA: Diagnosis present

## 2018-09-28 DIAGNOSIS — K5730 Diverticulosis of large intestine without perforation or abscess without bleeding: Secondary | ICD-10-CM | POA: Diagnosis not present

## 2018-09-28 MED ORDER — IOPAMIDOL (ISOVUE-300) INJECTION 61%
125.0000 mL | Freq: Once | INTRAVENOUS | Status: AC | PRN
  Administered 2018-09-28: 125 mL via INTRAVENOUS

## 2018-10-06 ENCOUNTER — Telehealth: Payer: Self-pay | Admitting: Family Medicine

## 2018-10-06 NOTE — Telephone Encounter (Signed)
Copied from [place] [id number]. Topic: Quick Communication - See Telephone Encounter >> Oct 06, 2018  3:18 PM Ivar Drape wrote: CRM for notification. See Telephone encounter for: 10/06/18. Patient would like a refill on her citalopram (CELEXA) 40 MG tablet medication and have it sent to her preferred mail order pharmacy [facility].

## 2018-10-07 MED ORDER — CITALOPRAM HYDROBROMIDE 40 MG PO TABS: 40.0000 mg | ORAL_TABLET | Freq: Every day | ORAL | 3 refills | Status: DC

## 2018-10-07 NOTE — Telephone Encounter (Signed)
Spoke with pt and informed her that Dr Sanda Klein refused the prescription due to a 90 day supply with 4 refills sent to [facility] in October. And that DR Blenda Mounts was the one that prescribed the medication. Pt verbalized understanding.

## 2018-10-07 NOTE — Telephone Encounter (Signed)
Please let pt know that we are absolutely happy to fill this There was some confusion as her previous doctor refilled it, then canceled those refills She has transferred care to me so of course we'll take care of that for her Thank you

## 2018-10-07 NOTE — Addendum Note (Signed)
Addended by: LADA, Satira Anis on: 10/07/2018 11:24 AM   Modules accepted: Orders

## 2018-10-07 NOTE — Telephone Encounter (Signed)
I called her myself and explained; all is good

## 2018-10-10 ENCOUNTER — Other Ambulatory Visit: Payer: Medicare HMO | Admitting: Urology

## 2018-10-29 ENCOUNTER — Other Ambulatory Visit: Payer: Self-pay | Admitting: Family Medicine

## 2018-10-29 NOTE — Telephone Encounter (Signed)
She has an appt on the 9th rx approved

## 2018-10-31 ENCOUNTER — Ambulatory Visit: Payer: Medicare HMO | Admitting: Family Medicine

## 2018-11-01 ENCOUNTER — Encounter: Payer: Self-pay | Admitting: Urology

## 2018-11-01 ENCOUNTER — Ambulatory Visit: Payer: Medicare HMO | Admitting: Urology

## 2018-11-01 ENCOUNTER — Ambulatory Visit: Payer: Medicare HMO | Admitting: Family Medicine

## 2018-11-01 VITALS — BP 136/79 | HR 58

## 2018-11-01 DIAGNOSIS — R3129 Other microscopic hematuria: Secondary | ICD-10-CM | POA: Diagnosis not present

## 2018-11-01 LAB — URINALYSIS, COMPLETE
BILIRUBIN UA: NEGATIVE
Glucose, UA: NEGATIVE
KETONES UA: NEGATIVE
Nitrite, UA: NEGATIVE
Specific Gravity, UA: >1.03 — ABNORMAL HIGH (ref 1.005–1.030)
UUROB: 4 mg/dL — AB (ref 0.2–1.0)
pH, UA: 5.5 (ref 5.0–7.5)

## 2018-11-01 LAB — MICROSCOPIC EXAMINATION

## 2018-11-01 NOTE — Progress Notes (Signed)
Cystoscopy Procedure Note:  Indication: Microscopic hematuria  After informed consent and discussion of the procedure and its risks, Julie Perez was positioned and prepped in the standard fashion. Cystoscopy was performed with a flexible cystoscope. The urethra, bladder neck and entire bladder was visualized in a standard fashion.  Bladder mucosa was normal throughout.  Retroflexion showed no concerning findings at the bladder neck. The ureteral orifices were visualized in their normal location and orientation.   Imaging: I personally reviewed the CT urogram dated 09/28/2018.  There is no hydronephrosis, urolithiasis, renal masses, or filling defects  Findings: Normal cystoscopy  Assessment and Plan: -She is currently on estrogen cream for rUTIs.  We also discussed cranberry prophylaxis, adequate hydration, and timed voiding. -Follow-up with Julie Perez in 1 month regarding recurrent UTIs  Nickolas Madrid, MD 11/01/2018

## 2018-11-15 ENCOUNTER — Telehealth: Payer: Self-pay | Admitting: Family Medicine

## 2018-11-15 MED ORDER — CITALOPRAM HYDROBROMIDE 20 MG PO TABS: 20.0000 mg | ORAL_TABLET | Freq: Every day | ORAL | 1 refills | Status: DC

## 2018-11-15 NOTE — Telephone Encounter (Signed)
Note received from [facility] regarding her citalopram dose; they recommend it be no higher than 20 mg based on her age I called and left a detailed msg for patient Decrease escitalopram to just 20 mg daily; she can take half of the 40 mg pill to equal 20 mg to use that up, then use new Rx from mail order for the lower dose pills next time New Rx sent I explained that will this decrease, please contact us if any change in mood, anxiety, depression, etc. Please call with any questions

## 2018-11-15 NOTE — Telephone Encounter (Signed)
See previous message; it should say that her CITALOPRAM dose should be 20 mg (not escitalopram) I spoke with patient personally Explained the need to drop dose; she agrees to go to 20 mg daily Call with any issues

## 2018-12-03 ENCOUNTER — Other Ambulatory Visit: Payer: Self-pay | Admitting: Family Medicine

## 2018-12-04 NOTE — Progress Notes (Signed)
12/05/2018 9:48 AM   Julie Perez 1946/09/01 [id number]  Referring provider: Arnetha Courser, MD [address] [place], [place] [address]  Chief Complaint  Patient presents with  . Follow-up    UTI    HPI: Patient is a 73 -year-old Caucasian female with a history of hematuria and rUTI's who presents today for follow up.    History of hematuria Second hand smoke exposure as a child.  CTU in 09/2018 revealed normal adrenal glands. No renal calculi or hydronephrosis. No hydroureter or ureteric calculi. No bladder calculi. Interpolar left renal too small to characterize lesion of approximately 6 mm is most likely a cyst. No suspicious renal mass on post-contrast imaging. Good renal collecting system opacification on delayed images. Good ureteric opacification, without filling defect.  Mild bladder wall irregularity and pericystic edema, including on image 79/7.  No filling defect within the urinary bladder on well opacified delayed images.  Cysto on 11/01/2018 with Dr. Diamantina Providence was NED.   Today, she does not report any gross hematuria.    rUTI's Patient states that she has had several urinary tract infections over the last year.  Reviewing her records,  she has had one documented positive UTI.  Klebsiella pneumoniae - resistant to ampicillin - 09/06/2018    Her symptoms with a urinary tract infection consist of burning in vaginal area.  She denies dysuria, gross hematuria, suprapubic pain, back pain, abdominal pain or flank pain associated with UTI's.   She has not had any recent fevers, chills, nausea or vomiting associated with UTI's.   She does not have a history of nephrolithiasis, GU surgery or GU trauma.   She is not sexually active.  She is postmenopausal, she has been prescribed vaginal estrogen cream.  She is having a hard remembering to applying the cream.    She denies constipation and/or diarrhea.   She does have urge and SUI incontinence.  She is not using  incontinence pads.   She is not drinking enough water daily.   She is drinking three bottles daily.  She is drinking one or two coffees daily.  She is not drinking teas.  She is not drinking any juices.  She drinks alcohol once in a blue moon.  She is drinking two to three Cokes a day.    PMH: Past Medical History:  Diagnosis Date  . Arthritis   . Frequent headaches    stress  related  . Grade I diastolic dysfunction 02/13/4009   Echo June 2019  . High cholesterol   . Hyperlipidemia   . Hypertension   . Migraines   . Mild concentric left ventricular hypertrophy 05/17/2018   Echo June 2019  . Thyroid disease     Surgical History: Past Surgical History:  Procedure Laterality Date  . ABDOMINAL HYSTERECTOMY  1979  . VAGINAL HYSTERECTOMY  1979    Home Medications:  Allergies as of 12/05/2018   No Known Allergies     Medication List       Accurate as of December 05, 2018  9:48 AM. Always use your most recent med list.        acyclovir 400 MG tablet Commonly known as:  ZOVIRAX Take 1 tablet (400 mg total) by mouth daily.   amLODipine 5 MG tablet Commonly known as:  NORVASC TAKE 1 TABLET AT BEDTIME   atorvastatin 40 MG tablet Commonly known as:  LIPITOR Take 1 tablet (40 mg total) by mouth at bedtime.   citalopram  20 MG tablet Commonly known as:  CELEXA Take 1 tablet (20 mg total) by mouth daily.   conjugated estrogens vaginal cream Commonly known as:  PREMARIN Apply 0.5mg  (pea-sized amount)  just inside the vaginal introitus with a finger-tip on  Monday, Wednesday and Friday nights.   estradiol 0.1 MG/GM vaginal cream Commonly known as:  ESTRACE VAGINAL Apply 0.5mg  (pea-sized amount)  just inside the vaginal introitus with a finger-tip on Monday, Wednesday and Friday nights.   levothyroxine 112 MCG tablet Commonly known as:  SYNTHROID, LEVOTHROID Take 1 tablet (112 mcg total) by mouth daily before breakfast.   nystatin-triamcinolone cream Commonly known as:   MYCOLOG II Apply 1 application topically 2 (two) times daily.   Vitamin D2 50 MCG (2000 UT) Tabs Vitamin D2 1,250 mcg (50,000 unit) capsule       Allergies: No Known Allergies  Family History: Family History  Problem Relation Age of Onset  . Hypertension Mother   . Alzheimer's disease Mother   . Cancer Father 49       prostate cancer  . Cancer Sister 51       breast cancer  . Breast cancer Sister 58  . Aneurysm Sister   . Diabetes Brother        type 2  . Heart disease Sister        cabg x 4, tobacco abuse  . Diabetes Sister        type 2    Social History:  reports that she has never smoked. She has never used smokeless tobacco. She reports current alcohol use of about 1.0 standard drinks of alcohol per week. She reports that she does not use drugs.  ROS: UROLOGY Frequent Urination?: No Hard to postpone urination?: No Burning/pain with urination?: No Get up at night to urinate?: No Leakage of urine?: No Urine stream starts and stops?: No Trouble starting stream?: No Do you have to strain to urinate?: No Blood in urine?: No Urinary tract infection?: No Sexually transmitted disease?: No Injury to kidneys or bladder?: No Painful intercourse?: No Weak stream?: No Currently pregnant?: No Vaginal bleeding?: No Last menstrual period?: n  Gastrointestinal Nausea?: No Vomiting?: No Indigestion/heartburn?: No Diarrhea?: No Constipation?: No  Constitutional Fever: No Night sweats?: No Weight loss?: No Fatigue?: No  Skin Skin rash/lesions?: No Itching?: No  Eyes Blurred vision?: No Double vision?: No  Ears/Nose/Throat Sore throat?: No Sinus problems?: No  Hematologic/Lymphatic Swollen glands?: No Easy bruising?: No  Cardiovascular Leg swelling?: No Chest pain?: No  Respiratory Cough?: No Shortness of breath?: No  Endocrine Excessive thirst?: No  Musculoskeletal Back pain?: No Joint pain?: Yes  Neurological Headaches?:  No Dizziness?: No  Psychologic Depression?: No Anxiety?: No  Physical Exam: BP 133/76   Pulse (!) 59   Temp (!) 95.8 F (35.4 C) (Oral)   Resp 16   Ht 5\' 6"  (1.676 m)   Wt 217 lb (98.4 kg)   BMI 35.02 kg/m   Constitutional:  Well nourished. Alert and oriented, No acute distress. HEENT: [place] AT, moist mucus membranes.  Trachea midline, no masses. Cardiovascular: No clubbing, cyanosis, or edema. Respiratory: Normal respiratory effort, no increased work of breathing. Skin: No rashes, bruises or suspicious lesions. Neurologic: Grossly intact, no focal deficits, moving all 4 extremities. Psychiatric: Normal mood and affect.   Laboratory Data: Lab Results  Component Value Date   WBC 7.5 05/02/2018   HGB 14.0 05/02/2018   HCT 40.6 05/02/2018   MCV 87.5 05/02/2018   PLT  250 05/02/2018    Lab Results  Component Value Date   CREATININE 0.73 09/06/2018    No results found for: PSA  No results found for: TESTOSTERONE  Lab Results  Component Value Date   HGBA1C 5.5 01/04/2017    Lab Results  Component Value Date   TSH 3.01 05/02/2018       Component Value Date/Time   CHOL 176 05/02/2018 1451   HDL 54 05/02/2018 1451   CHOLHDL 3.3 05/02/2018 1451   VLDL 33.0 01/04/2017 1109   LDLCALC 96 05/02/2018 1451    Lab Results  Component Value Date   AST 15 05/02/2018   Lab Results  Component Value Date   ALT 13 05/02/2018   No components found for: ALKALINEPHOPHATASE No components found for: BILIRUBINTOTAL  No results found for: ESTRADIOL  I have reviewed the labs.   Assessment & Plan:    1. History of hematuria Hematuria work up completed in 10/2018 - findings positive for NED No report of gross hematuria  RTC in one year for UA - patient to report any gross hematuria in the interim    2. Dysuria CTU and cysto were negative Likely due to vaginal atrophy Continue the vaginal estrogen cream  3. Vaginal atrophy Continue the vaginal estrogen cream on  Monday, Wednesday and Friday nights RTC in one year for exam  4. Urge incontinence Discussed behavioral therapies, bladder training and bladder control strategies Offered pelvic floor muscle training - patient deferred Fluid management - patient will be increasing her water intake and decreasing her Coke intake as she is going on a diet Offered medical therapy - patient deferred RTC in one year for symptom recheck or if symptoms worsen  5. Nocturia Will discuss with Dr. Sanda Klein if there are alternatives to the CPAP machine   Return in about 1 year (around 12/06/2019) for recheck UA and office visit .  These notes generated with voice recognition software. I apologize for typographical errors.  Zara Council, PA-C  [facility] [address] [place]  [place], [place] [address] [phone]

## 2018-12-05 ENCOUNTER — Ambulatory Visit (INDEPENDENT_AMBULATORY_CARE_PROVIDER_SITE_OTHER): Payer: Medicare HMO | Admitting: Urology

## 2018-12-05 ENCOUNTER — Other Ambulatory Visit: Payer: Self-pay

## 2018-12-05 ENCOUNTER — Encounter: Payer: Self-pay | Admitting: Urology

## 2018-12-05 VITALS — BP 133/76 | HR 59 | Temp 95.8°F | Resp 16 | Ht 66.0 in | Wt 217.0 lb

## 2018-12-05 DIAGNOSIS — N3941 Urge incontinence: Secondary | ICD-10-CM | POA: Diagnosis not present

## 2018-12-05 DIAGNOSIS — R351 Nocturia: Secondary | ICD-10-CM

## 2018-12-05 DIAGNOSIS — N952 Postmenopausal atrophic vaginitis: Secondary | ICD-10-CM

## 2018-12-05 DIAGNOSIS — R300 Dysuria: Secondary | ICD-10-CM

## 2018-12-05 DIAGNOSIS — Z87448 Personal history of other diseases of urinary system: Secondary | ICD-10-CM

## 2018-12-05 NOTE — Patient Instructions (Signed)
It is important that you drink 6 to 8 glasses of water daily.  Half of the fluid intake should be water. Take most of your fluids before dinner. Don't drink too much liquid at once, sip instead of gulp.  There are certain beverages and foods that have known to the bladder irritants and they should be avoided or consumed infrequently. These include but not limited to beverages and foods containing caffeine, carbonated beverages, highly acidic or spicy foods, such as fruits, tomato products, artificial sweeteners and alcohol.  If you find it difficult to avoid these foods it may be helpful to alternate water in between consuming these foods.  It is important to stay well hydrated and eat 24 to 30 grams of fiber daily.    It has shown that even weight loss can help with urinary incontinence. In this study it demonstrated that and a percent weight loss and obese women (20 pound loss for a 250 pound woman) cut the number of incontinence episodes nearly in half. 

## 2018-12-22 ENCOUNTER — Other Ambulatory Visit: Payer: Self-pay | Admitting: Family Medicine

## 2018-12-22 NOTE — Telephone Encounter (Signed)
Lab Results  Component Value Date   TSH 3.01 05/02/2018   T4TOTAL 5.7 05/23/2015

## 2019-01-09 ENCOUNTER — Ambulatory Visit: Payer: Medicare HMO | Admitting: Family Medicine

## 2019-01-09 ENCOUNTER — Encounter: Payer: Self-pay | Admitting: Family Medicine

## 2019-01-09 VITALS — BP 118/70 | HR 65 | Temp 98.3°F | Resp 12 | Ht 66.0 in | Wt 208.5 lb

## 2019-01-09 DIAGNOSIS — I10 Essential (primary) hypertension: Secondary | ICD-10-CM

## 2019-01-09 DIAGNOSIS — E669 Obesity, unspecified: Secondary | ICD-10-CM

## 2019-01-09 DIAGNOSIS — E538 Deficiency of other specified B group vitamins: Secondary | ICD-10-CM | POA: Diagnosis not present

## 2019-01-09 DIAGNOSIS — N952 Postmenopausal atrophic vaginitis: Secondary | ICD-10-CM

## 2019-01-09 DIAGNOSIS — E785 Hyperlipidemia, unspecified: Secondary | ICD-10-CM

## 2019-01-09 DIAGNOSIS — E559 Vitamin D deficiency, unspecified: Secondary | ICD-10-CM

## 2019-01-09 DIAGNOSIS — M722 Plantar fascial fibromatosis: Secondary | ICD-10-CM | POA: Diagnosis not present

## 2019-01-09 DIAGNOSIS — F411 Generalized anxiety disorder: Secondary | ICD-10-CM

## 2019-01-09 DIAGNOSIS — Z5181 Encounter for therapeutic drug level monitoring: Secondary | ICD-10-CM

## 2019-01-09 DIAGNOSIS — R69 Illness, unspecified: Secondary | ICD-10-CM | POA: Diagnosis not present

## 2019-01-09 DIAGNOSIS — E890 Postprocedural hypothyroidism: Secondary | ICD-10-CM | POA: Diagnosis not present

## 2019-01-09 DIAGNOSIS — E66811 Obesity, class 1: Secondary | ICD-10-CM

## 2019-01-09 NOTE — Assessment & Plan Note (Signed)
Not in the family; hopeful that this will improve with weight loss, but I am concerned about possible short-term rise with keto diet

## 2019-01-09 NOTE — Assessment & Plan Note (Signed)
Controlled on curren tmedicine; refill amlodipine; weight loss encouraged

## 2019-01-09 NOTE — Progress Notes (Signed)
BP 118/70   Pulse 65   Temp 98.3 F (36.8 C) (Oral)   Resp 12   Ht 5\' 6"  (1.676 m)   Wt 208 lb 8 oz (94.6 kg)   SpO2 95%   BMI 33.65 kg/m    Subjective:    Patient ID: Julie Perez, female    DOB: 06/13/1946, 73 y.o.   MRN: [id number]  HPI: Julie Perez is a 73 y.o. female  Chief Complaint  Patient presents with  . Follow-up  . Cyst    on bottom of left foot    HPI Here for regular f/u; no medical excitement  She has something on the bottom of her foot; left foot bigger than right foot; noticed it the last fewo months, getting pedicures and woman told her about it; she can feel it; no pain; is getting bigger; no injuries in the past; wearing fair shoes; no redness or drainage  Urinary incontinence; using different generic cream; soso results  Hypertension; not checking   Started keto diet 6 weeks ago, lost 7-8 pounds; no hot dogs; seldom eats bacon; uses ham rolled up with cheese from deli; eating eggs, "Ronny Flurry", maybe 4 a week  High cholesterol; on statin; not in the family  Anxiety; feeling good on the citalopram  Hypothyroidism; feeling good; s/p iodine treatment; weight loss is intentional; no loose stools or tremor or shaking  Lab Results  Component Value Date   TSH 3.01 05/02/2018   T4TOTAL 5.7 05/23/2015   Hx of low vit D, taking   Depression screen [facility] 2/9 01/09/2019 08/29/2018 08/23/2018 05/02/2018 05/20/2017  Decreased Interest 0 0 0 0 0  Down, Depressed, Hopeless 0 0 0 2 0  PHQ - 2 Score 0 0 0 2 0  Altered sleeping 0 0 0 0 -  Tired, decreased energy 0 0 0 3 -  Change in appetite 0 0 0 3 -  Feeling bad or failure about yourself  0 0 0 3 -  Trouble concentrating 0 0 0 0 -  Moving slowly or fidgety/restless 0 0 0 0 -  Suicidal thoughts 0 0 0 0 -  PHQ-9 Score 0 0 0 11 -  Difficult doing work/chores Not difficult at all Not difficult at all Not difficult at all Not difficult at all -   Fall Risk  01/09/2019 08/29/2018 08/23/2018 05/02/2018  05/20/2017  Falls in the past year? 0 No No No No  Comment - - - - -  Number falls in past yr: 0 - - - -  Injury with Fall? 0 - - - -  Risk for fall due to : - - Other (Comment) - -  Risk for fall due to: Comment - - N/A - -    Relevant past medical, surgical, family and social history reviewed Past Medical History:  Diagnosis Date  . Arthritis   . Frequent headaches    stress  related  . Grade I diastolic dysfunction 8/88/9169   Echo June 2019  . High cholesterol   . Hyperlipidemia   . Hypertension   . Migraines   . Mild concentric left ventricular hypertrophy 05/17/2018   Echo June 2019  . Thyroid disease    Past Surgical History:  Procedure Laterality Date  . ABDOMINAL HYSTERECTOMY  1979  . VAGINAL HYSTERECTOMY  1979   Family History  Problem Relation Age of Onset  . Hypertension Mother   . Alzheimer's disease Mother   . Cancer Father 28  prostate cancer  . Cancer Sister 73       breast cancer  . Breast cancer Sister 36  . Aneurysm Sister   . Diabetes Brother        type 2  . Heart disease Sister        cabg x 4, tobacco abuse  . Diabetes Sister        type 2  . Lung cancer Nephew    Social History   Tobacco Use  . Smoking status: Never Smoker  . Smokeless tobacco: Never Used  . Tobacco comment: smoking cessation materials not required  Substance Use Topics  . Alcohol use: Yes    Alcohol/week: 1.0 standard drinks    Types: 1 Glasses of wine per week    Comment: rare  . Drug use: No     Office Visit from 01/09/2019 in [facility]  AUDIT-C Score  1      Interim medical history since last visit reviewed. Allergies and medications reviewed  Review of Systems Per HPI unless specifically indicated above     Objective:    BP 118/70   Pulse 65   Temp 98.3 F (36.8 C) (Oral)   Resp 12   Ht 5\' 6"  (1.676 m)   Wt 208 lb 8 oz (94.6 kg)   SpO2 95%   BMI 33.65 kg/m   Wt Readings from Last 3 Encounters:  01/09/19 208 lb 8  oz (94.6 kg)  12/05/18 217 lb (98.4 kg)  09/26/18 218 lb (98.9 kg)    Physical Exam Constitutional:      General: She is not in acute distress.    Appearance: She is well-developed. She is obese. She is not diaphoretic.  HENT:     Head: Normocephalic and atraumatic.  Eyes:     General: No scleral icterus. Neck:     Thyroid: No thyromegaly.  Cardiovascular:     Rate and Rhythm: Normal rate and regular rhythm.     Pulses:          Dorsalis pedis pulses are 2+ on the right side and 2+ on the left side.     Heart sounds: Normal heart sounds. No murmur.  Pulmonary:     Effort: Pulmonary effort is normal. No respiratory distress.     Breath sounds: Normal breath sounds. No wheezing.  Abdominal:     General: Bowel sounds are normal. There is no distension.     Palpations: Abdomen is soft.  Musculoskeletal:     Right foot: Deformity present.     Left foot: Deformity present.       Feet:  Feet:     Right foot:     Skin integrity: No ulcer, erythema or callus.     Left foot:     Skin integrity: No ulcer, erythema or callus.     Comments: Palpable firm nodules both soles, LEFT > right Skin:    General: Skin is warm and dry.     Coloration: Skin is not pale.  Neurological:     Mental Status: She is alert.  Psychiatric:        Behavior: Behavior normal.        Thought Content: Thought content normal.        Judgment: Judgment normal.       Assessment & Plan:   Problem List Items Addressed This Visit      Cardiovascular and Mediastinum   Essential hypertension    Controlled on curren tmedicine;  refill amlodipine; weight loss encouraged        Endocrine   Hypothyroidism, postradioiodine therapy   Relevant Orders   TSH     Genitourinary   Postmenopausal atrophic vaginitis    Under the care of urology, using cream; encouraged UTD mammograms with estrogen        Other   Vitamin D deficiency   Relevant Orders   VITAMIN D 25 Hydroxy (Vit-D Deficiency, Fractures)    Obesity (BMI 30.0-34.9)    Encouraged weight loss; offered nutrition referral patient politely declined but is welcome to call for referral if needec      Hyperlipidemia LDL goal <130    Not in the family; hopeful that this will improve with weight loss, but I am concerned about possible short-term rise with keto diet      Relevant Orders   Lipid panel   Generalized anxiety disorder    Would like to go off of the citalopram eventually, but not now       Other Visit Diagnoses    Plantar fascial fibromatosis of both feet    -  Primary   Relevant Orders   Ambulatory referral to Podiatry   Vitamin B12 deficiency       Relevant Orders   Vitamin B12   Medication monitoring encounter       Relevant Orders   COMPLETE METABOLIC PANEL WITH GFR       Follow up plan: Return in about 6 months (around 07/10/2019) for follow-up visit with Dr. Sanda Klein; Medicare Wellness visits with [facility] when due.  An after-visit summary was printed and given to the patient at [place].  Please see the patient instructions which may contain other information and recommendations beyond what is mentioned above in the assessment and plan.  No orders of the defined types were placed in this encounter.   Orders Placed This Encounter  Procedures  . COMPLETE METABOLIC PANEL WITH GFR  . Lipid panel  . TSH  . VITAMIN D 25 Hydroxy (Vit-D Deficiency, Fractures)  . Vitamin B12  . Ambulatory referral to Podiatry

## 2019-01-09 NOTE — Assessment & Plan Note (Signed)
Would like to go off of the citalopram eventually, but not now

## 2019-01-09 NOTE — Assessment & Plan Note (Signed)
Under the care of urology, using cream; encouraged UTD mammograms with estrogen

## 2019-01-09 NOTE — Assessment & Plan Note (Signed)
Encouraged weight loss; offered nutrition referral patient politely declined but is welcome to call for referral if needec

## 2019-01-09 NOTE — Patient Instructions (Addendum)
Please check your medicines and vitamins at home and let us know if they don't match We'll get you to the foot doctor We'll get labs today If you have not heard anything from my staff in a week about any orders/referrals/studies from today, please contact us here to follow-up [phone]   Obesity, Adult Obesity is the condition of having too much total body fat. Being overweight or obese means that your weight is greater than what is considered healthy for your body size. Obesity is determined by a measurement called BMI. BMI is an estimate of body fat and is calculated from height and weight. For adults, a BMI of 30 or higher is considered obese. Obesity can eventually lead to other health concerns and major illnesses, including:  Stroke.  Coronary artery disease (CAD).  Type 2 diabetes.  Some types of cancer, including cancers of the colon, breast, uterus, and gallbladder.  Osteoarthritis.  High blood pressure (hypertension).  High cholesterol.  Sleep apnea.  Gallbladder stones.  Infertility problems. What are the causes? The main cause of obesity is taking in (consuming) more calories than your body uses for energy. Other factors that contribute to this condition may include:  Being born with genes that make you more likely to become obese.  Having a medical condition that causes obesity. These conditions include: ? Hypothyroidism. ? Polycystic ovarian syndrome (PCOS). ? Binge-eating disorder. ? Cushing syndrome.  Taking certain medicines, such as steroids, antidepressants, and seizure medicines.  Not being physically active (sedentary lifestyle).  Living where there are limited places to exercise safely or buy healthy foods.  Not getting enough sleep. What increases the risk? The following factors may increase your risk of this condition:  Having a family history of obesity.  Being a woman of African-American descent.  Being a man of Hispanic  descent. What are the signs or symptoms? Having excessive body fat is the main symptom of this condition. How is this diagnosed? This condition may be diagnosed based on:  Your symptoms.  Your medical history.  A physical exam. Your health care provider may measure: ? Your BMI. If you are an adult with a BMI between 25 and less than 30, you are considered overweight. If you are an adult with a BMI of 30 or higher, you are considered obese. ? The distances around your hips and your waist (circumferences). These may be compared to each other to help diagnose your condition. ? Your skinfold thickness. Your health care provider may gently pinch a fold of your skin and measure it. How is this treated? Treatment for this condition often includes changing your lifestyle. Treatment may include some or all of the following:  Dietary changes. Work with your health care provider and a dietitian to set a weight-loss goal that is healthy and reasonable for you. Dietary changes may include eating: ? Smaller portions. A portion size is the amount of a particular food that is healthy for you to eat at one time. This varies from person to person. ? Low-calorie or low-fat options. ? More whole grains, fruits, and vegetables.  Regular physical activity. This may include aerobic activity (cardio) and strength training.  Medicine to help you lose weight. Your health care provider may prescribe medicine if you are unable to lose 1 pound a week after 6 weeks of eating more healthily and doing more physical activity.  Surgery. Surgical options may include gastric banding and gastric bypass. Surgery may be done if: ? Other treatments have not  helped to improve your condition. ? You have a BMI of 40 or higher. ? You have life-threatening health problems related to obesity. Follow these instructions at home:  Eating and drinking   Follow recommendations from your health care provider about what you eat and  drink. Your health care provider may advise you to: ? Limit fast foods, sweets, and processed snack foods. ? Choose low-fat options, such as low-fat milk instead of whole milk. ? Eat 5 or more servings of fruits or vegetables every day. ? Eat at home more often. This gives you more control over what you eat. ? Choose healthy foods when you eat out. ? Learn what a healthy portion size is. ? Keep low-fat snacks on hand. ? Avoid sugary drinks, such as soda, fruit juice, iced tea sweetened with sugar, and flavored milk. ? Eat a healthy breakfast.  Drink enough water to keep your urine clear or pale yellow.  Do not go without eating for long periods of time (do not fast) or follow a fad diet. Fasting and fad diets can be unhealthy and even dangerous. Physical Activity  Exercise regularly, as told by your health care provider. Ask your health care provider what types of exercise are safe for you and how often you should exercise.  Warm up and stretch before being active.  Cool down and stretch after being active.  Rest between periods of activity. Lifestyle  Limit the time that you spend in front of your TV, computer, or video game system.  Find ways to reward yourself that do not involve food.  Limit alcohol intake to no more than 1 drink a day for nonpregnant women and 2 drinks a day for men. One drink equals 12 oz of beer, 5 oz of wine, or 1 oz of hard liquor. General instructions  Keep a weight loss journal to keep track of the food you eat and how much you exercise you get.  Take over-the-counter and prescription medicines only as told by your health care provider.  Take vitamins and supplements only as told by your health care provider.  Consider joining a support group. Your health care provider may be able to recommend a support group.  Keep all follow-up visits as told by your health care provider. This is important. Contact a health care provider if:  You are unable to  meet your weight loss goal after 6 weeks of dietary and lifestyle changes. This information is not intended to replace advice given to you by your health care provider. Make sure you discuss any questions you have with your health care provider. Document Released: 12/17/2004 Document Revised: 04/13/2016 Document Reviewed: 08/28/2015 Elsevier Interactive Patient Education  2019 [employer]  Preventing Unhealthy [employer], Adult Staying at a healthy weight is important to your overall health. When fat builds up in your body, you may become overweight or obese. Being overweight or obese increases your risk of developing certain health problems, such as heart disease, diabetes, sleeping problems, joint problems, and some types of cancer. Unhealthy weight gain is often the result of making unhealthy food choices or not getting enough exercise. You can make changes to your lifestyle to prevent obesity and stay as healthy as possible. What nutrition changes can be made?   Eat only as much as your body needs. To do this: ? Pay attention to signs that you are hungry or full. Stop eating as soon as you feel full. ? If you feel hungry, try drinking water first  before eating. Drink enough water so your urine is clear or pale yellow. ? Eat smaller portions. Pay attention to portion sizes when eating out. ? Look at serving sizes on food labels. Most foods contain more than one serving per container. ? Eat the recommended number of calories for your gender and activity level. For most active people, a daily total of 2,000 calories is appropriate. If you are trying to lose weight or are not very active, you may need to eat fewer calories. Talk with your health care provider or a diet and nutrition specialist (dietitian) about how many calories you need each day.  Choose healthy foods, such as: ? Fruits and vegetables. At each meal, try to fill at least half of your plate with fruits and vegetables. ? Whole  grains, such as whole-wheat bread, brown rice, and quinoa. ? Lean meats, such as chicken or fish. ? Other healthy proteins, such as beans, eggs, or tofu. ? Healthy fats, such as nuts, seeds, fatty fish, and olive oil. ? Low-fat or fat-free dairy products.  Check food labels, and avoid food and drinks that: ? Are high in calories. ? Have added sugar. ? Are high in sodium. ? Have saturated fats or trans fats.  Cook foods in healthier ways, such as by baking, broiling, or grilling.  Make a meal plan for the week, and shop with a grocery list to help you stay on track with your purchases. Try to avoid going to the grocery store when you are hungry.  When grocery shopping, try to shop around the outside of the store first, where the fresh foods are. Doing this helps you to avoid prepackaged foods, which can be high in sugar, salt (sodium), and fat. What lifestyle changes can be made?   Exercise for 30 or more minutes on 5 or more days each week. Exercising may include brisk walking, yard work, biking, running, swimming, and team sports like basketball and soccer. Ask your health care provider which exercises are safe for you.  Do muscle-strengthening activities, such as lifting weights or using resistance bands, on 2 or more days a week.  Do not use any products that contain nicotine or tobacco, such as cigarettes and e-cigarettes. If you need help quitting, ask your health care provider.  Limit alcohol intake to no more than 1 drink a day for nonpregnant women and 2 drinks a day for men. One drink equals 12 oz of beer, 5 oz of wine, or 1 oz of hard liquor.  Try to get 7-9 hours of sleep each night. What other changes can be made?  Keep a food and activity journal to keep track of: ? What you ate and how many calories you had. Remember to count the calories in sauces, dressings, and side dishes. ? Whether you were active, and what exercises you did. ? Your calorie, weight, and activity  goals.  Check your weight regularly. Track any changes. If you notice you have gained weight, make changes to your diet or activity routine.  Avoid taking weight-loss medicines or supplements. Talk to your health care provider before starting any new medicine or supplement.  Talk to your health care provider before trying any new diet or exercise plan. Why are these changes important? Eating healthy, staying active, and having healthy habits can help you to prevent obesity. Those changes also:  Help you manage stress and emotions.  Help you connect with friends and family.  Improve your self-esteem.  Improve your sleep.  Prevent long-term health problems. What can happen if changes are not made? Being obese or overweight can cause you to develop joint or bone problems, which can make it hard for you to stay active or do activities you enjoy. Being obese or overweight also puts stress on your heart and lungs and can lead to health problems like diabetes, heart disease, and some cancers. Where to find more information Talk with your health care provider or a dietitian about healthy eating and healthy lifestyle choices. You may also find information from:  [place] Department of Agriculture, MyPlate: FormerBoss.no  American Heart Association: www.heart.org  Centers for Disease Control and Prevention: http://www.wolf.info/ Summary  Staying at a healthy weight is important to your overall health. It helps you to prevent certain diseases and health problems, such as heart disease, diabetes, joint problems, sleep disorders, and some types of cancer.  Being obese or overweight can cause you to develop joint or bone problems, which can make it hard for you to stay active or do activities you enjoy.  You can prevent unhealthy weight gain by eating a healthy diet, exercising regularly, not smoking, limiting alcohol, and getting enough sleep.  Talk with your health care provider or a dietitian for  guidance about healthy eating and healthy lifestyle choices. This information is not intended to replace advice given to you by your health care provider. Make sure you discuss any questions you have with your health care provider. Document Released: 11/10/2016 Document Revised: 08/20/2017 Document Reviewed: 12/16/2016 Elsevier Interactive Patient Education  2019 [employer].

## 2019-01-10 LAB — COMPLETE METABOLIC PANEL WITH GFR
AG Ratio: 1.7 (calc) (ref 1.0–2.5)
ALKALINE PHOSPHATASE (APISO): 49 U/L (ref 37–153)
ALT: 21 U/L (ref 6–29)
AST: 19 U/L (ref 10–35)
Albumin: 4.7 g/dL (ref 3.6–5.1)
BUN: 23 mg/dL (ref 7–25)
CHLORIDE: 104 mmol/L (ref 98–110)
CO2: 29 mmol/L (ref 20–32)
Calcium: 9.8 mg/dL (ref 8.6–10.4)
Creat: 0.71 mg/dL (ref 0.60–0.93)
GFR, Est African American: 99 mL/min/{1.73_m2} (ref >=60)
GFR, Est Non African American: 86 mL/min/{1.73_m2} (ref >=60)
GLUCOSE: 97 mg/dL (ref 65–99)
Globulin: 2.8 g/dL (calc) (ref 1.9–3.7)
Potassium: 4.5 mmol/L (ref 3.5–5.3)
Sodium: 141 mmol/L (ref 135–146)
Total Bilirubin: 0.9 mg/dL (ref 0.2–1.2)
Total Protein: 7.5 g/dL (ref 6.1–8.1)

## 2019-01-10 LAB — VITAMIN B12: Vitamin B-12: 262 pg/mL (ref 200–1100)

## 2019-01-10 LAB — LIPID PANEL
CHOL/HDL RATIO: 2.9 (calc) (ref <5.0)
Cholesterol: 183 mg/dL (ref <200)
HDL: 64 mg/dL (ref >=50)
LDL Cholesterol (Calc): 102 mg/dL (calc) — ABNORMAL HIGH
Non-HDL Cholesterol (Calc): 119 mg/dL (calc) (ref <130)
Triglycerides: 80 mg/dL (ref <150)

## 2019-01-10 LAB — TSH: TSH: 2.33 m[IU]/L (ref 0.40–4.50)

## 2019-01-10 LAB — VITAMIN D 25 HYDROXY (VIT D DEFICIENCY, FRACTURES): Vit D, 25-Hydroxy: 29 ng/mL — ABNORMAL LOW (ref 30–100)

## 2019-01-27 ENCOUNTER — Encounter: Payer: Self-pay | Admitting: Podiatry

## 2019-01-27 ENCOUNTER — Ambulatory Visit (INDEPENDENT_AMBULATORY_CARE_PROVIDER_SITE_OTHER): Payer: Medicare HMO

## 2019-01-27 ENCOUNTER — Ambulatory Visit: Payer: Medicare HMO | Admitting: Podiatry

## 2019-01-27 ENCOUNTER — Other Ambulatory Visit: Payer: Self-pay | Admitting: Podiatry

## 2019-01-27 DIAGNOSIS — M722 Plantar fascial fibromatosis: Secondary | ICD-10-CM

## 2019-01-30 NOTE — Progress Notes (Signed)
   HPI: 73 year old female presenting today as a new patient with a chief complaint of nonpainful nodules noted to the plantar aspects of the arches bilaterally that appeared about two months ago. She states her PCP, Dr. Marzetta Board, referred her here for evaluation. She denies any pain and has not done anything for treatment. Patient is here for further evaluation and treatment.   Past Medical History:  Diagnosis Date  . Arthritis   . Frequent headaches    stress  related  . Grade I diastolic dysfunction 1/61/0960   Echo June 2019  . High cholesterol   . Hyperlipidemia   . Hypertension   . Migraines   . Mild concentric left ventricular hypertrophy 05/17/2018   Echo June 2019  . Thyroid disease       Physical Exam: General: The patient is alert and oriented x3 in no acute distress.  Dermatology: Skin is warm, dry and supple bilateral lower extremities. Negative for open lesions or macerations.  Vascular: Palpable pedal pulses bilaterally. No edema or erythema noted. Capillary refill within normal limits.  Neurological: Epicritic and protective threshold grossly intact bilaterally.   Musculoskeletal Exam: Palpable nodule noted to the plantar medial longitudinal arch of the bilateral feet. Pain with palpation also noted to the area. Range of motion within normal limits to all pedal and ankle joints bilateral. Muscle strength 5/5 in all groups bilateral.   Radiographic Exam:  Normal osseous mineralization. Joint spaces preserved. No fracture/dislocation/boney destruction.    Assessment: 1. plantar fibromas bilateral feet - asymptomatic    Plan of Care:  1. Patient evaluated. X-Rays reviewed.  2. Recommended good shoe gear.  3. Return to clinic as needed.      Edrick Kins, DPM [facility]  Dr. Edrick Kins, DPM    [address], [place] [address]                Office [phone]  Fax [phone]

## 2019-04-27 ENCOUNTER — Other Ambulatory Visit: Payer: Self-pay | Admitting: Family Medicine

## 2019-04-27 NOTE — Telephone Encounter (Signed)
Please schedule patient for follow up in the next 30 days.  

## 2019-04-28 NOTE — Telephone Encounter (Signed)
Script has been sent to pharmacy [facility] to schedule appt next month

## 2019-04-29 ENCOUNTER — Other Ambulatory Visit: Payer: Self-pay | Admitting: Family Medicine

## 2019-05-09 ENCOUNTER — Other Ambulatory Visit: Payer: Self-pay | Admitting: Family Medicine

## 2019-05-24 DIAGNOSIS — L578 Other skin changes due to chronic exposure to nonionizing radiation: Secondary | ICD-10-CM | POA: Diagnosis not present

## 2019-05-24 DIAGNOSIS — L720 Epidermal cyst: Secondary | ICD-10-CM | POA: Diagnosis not present

## 2019-05-24 DIAGNOSIS — I781 Nevus, non-neoplastic: Secondary | ICD-10-CM | POA: Diagnosis not present

## 2019-05-24 DIAGNOSIS — Z808 Family history of malignant neoplasm of other organs or systems: Secondary | ICD-10-CM | POA: Diagnosis not present

## 2019-05-24 DIAGNOSIS — Z872 Personal history of diseases of the skin and subcutaneous tissue: Secondary | ICD-10-CM | POA: Diagnosis not present

## 2019-06-12 DIAGNOSIS — M7542 Impingement syndrome of left shoulder: Secondary | ICD-10-CM | POA: Diagnosis not present

## 2019-06-20 ENCOUNTER — Other Ambulatory Visit: Payer: Self-pay | Admitting: Family Medicine

## 2019-06-29 ENCOUNTER — Other Ambulatory Visit: Payer: Self-pay | Admitting: Family Medicine

## 2019-07-10 ENCOUNTER — Ambulatory Visit: Payer: Medicare HMO | Admitting: Family Medicine

## 2019-07-17 DIAGNOSIS — K529 Noninfective gastroenteritis and colitis, unspecified: Secondary | ICD-10-CM | POA: Diagnosis not present

## 2019-07-17 DIAGNOSIS — Z03818 Encounter for observation for suspected exposure to other biological agents ruled out: Secondary | ICD-10-CM | POA: Diagnosis not present

## 2019-08-04 ENCOUNTER — Ambulatory Visit: Payer: Medicare HMO | Admitting: Family Medicine

## 2019-08-04 ENCOUNTER — Encounter: Payer: Self-pay | Admitting: Family Medicine

## 2019-08-04 ENCOUNTER — Other Ambulatory Visit: Payer: Self-pay

## 2019-08-04 VITALS — BP 124/82 | HR 64 | Temp 97.6°F | Resp 16 | Ht 66.0 in | Wt 197.2 lb

## 2019-08-04 DIAGNOSIS — E559 Vitamin D deficiency, unspecified: Secondary | ICD-10-CM | POA: Diagnosis not present

## 2019-08-04 DIAGNOSIS — E669 Obesity, unspecified: Secondary | ICD-10-CM

## 2019-08-04 DIAGNOSIS — E785 Hyperlipidemia, unspecified: Secondary | ICD-10-CM

## 2019-08-04 DIAGNOSIS — N3001 Acute cystitis with hematuria: Secondary | ICD-10-CM

## 2019-08-04 DIAGNOSIS — E890 Postprocedural hypothyroidism: Secondary | ICD-10-CM

## 2019-08-04 DIAGNOSIS — G4733 Obstructive sleep apnea (adult) (pediatric): Secondary | ICD-10-CM | POA: Diagnosis not present

## 2019-08-04 DIAGNOSIS — G8929 Other chronic pain: Secondary | ICD-10-CM

## 2019-08-04 DIAGNOSIS — E538 Deficiency of other specified B group vitamins: Secondary | ICD-10-CM | POA: Diagnosis not present

## 2019-08-04 DIAGNOSIS — I10 Essential (primary) hypertension: Secondary | ICD-10-CM | POA: Diagnosis not present

## 2019-08-04 DIAGNOSIS — F411 Generalized anxiety disorder: Secondary | ICD-10-CM

## 2019-08-04 DIAGNOSIS — E66811 Obesity, class 1: Secondary | ICD-10-CM

## 2019-08-04 DIAGNOSIS — R69 Illness, unspecified: Secondary | ICD-10-CM | POA: Diagnosis not present

## 2019-08-04 DIAGNOSIS — M25512 Pain in left shoulder: Secondary | ICD-10-CM | POA: Diagnosis not present

## 2019-08-04 LAB — POCT URINALYSIS DIPSTICK
Bilirubin, UA: NEGATIVE
Glucose, UA: NEGATIVE
Ketones, UA: NEGATIVE
Protein, UA: POSITIVE — AB
Spec Grav, UA: 1.015 (ref 1.010–1.025)
Urobilinogen, UA: NEGATIVE E.U./dL — AB
pH, UA: 5 (ref 5.0–8.0)

## 2019-08-04 MED ORDER — AMLODIPINE BESYLATE 5 MG PO TABS: 5.0000 mg | ORAL_TABLET | Freq: Every day | ORAL | 2 refills | Status: DC

## 2019-08-04 MED ORDER — SULFAMETHOXAZOLE-TRIMETHOPRIM 800-160 MG PO TABS: 1.0000 | ORAL_TABLET | Freq: Two times a day (BID) | ORAL | 0 refills | Status: AC

## 2019-08-04 NOTE — Progress Notes (Signed)
Name: Julie Perez   MRN: [id number]    DOB: Oct 31, 1946   Date:08/04/2019       Progress Note  Subjective  Chief Complaint  Chief Complaint  Patient presents with  . Hypertension  . Hypothyroidism  . Urinary Tract Infection  . Shoulder Pain    left    HPI  Dysuria: Symptoms for about a week now; she denies abdominal pain, flank pain, low back pain, nausea, vomiting, no frank hematuria, no history kidney stones; no fevers/chills.  HTN: She had echo in June 2019; denies vision changes, headaches, chest pain, shortness of breath, or BLE edema.  Taking amlodipine 5mg  daily and tolerating well.  BP is at goal today; not checking BP's at home.    Obesity: She has been following a low carb diet; gym is opening back up and we will provide a note for her to return with precautions. Down 12lbs since last visit.  HLD: Denies chest pain, shortness of breath, or myalgias.  Takes her atorvastatin without issues.   LEFT shoulder pain: She canceled appt with Dr. Harlow Mares, wants to trial acupuncture with her chiropractor first.  Taking meloxicam PRN. Had cortisone shot but this did not help much.   Hypothyroid: On the same dose for many years; denies hair/skin/nail changes, cold intolerance or fatigue, or palpitations; always has some heat intolerance.  Last labs were normal; stable for many years now.   OSA: Does not wear CPAP, took the machine back because she could not sleep with the machine.  Working on weight loss.   Vitamin D and Vitamin B12 deficiencies: Encouraged her to take supplementation as last labs showed low levels.  She will restart today.  GAD: She separated from her husband and is feeling much better since separating.  She is taking celexa 20mg  (down from the 40mg  dosing) and this is working well for her.  No panic, crying episodes; no SI/HI.   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  Patient Active Problem List   Diagnosis Date Noted  . Mild  concentric left ventricular hypertrophy 05/17/2018  . Grade I diastolic dysfunction [id number]  . DDD (degenerative disc disease), lumbar 02/07/2018  . Encounter for preventive health examination 07/05/2017  . Right lumbar radiculopathy 07/05/2017  . 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  . Hypokalemia 05/04/2017  . Obesity (BMI 30.0-34.9) 01/05/2017  . Dysuria 06/18/2016  . Fatigue 05/26/2015  . Cardiomegaly 04/20/2015  . Essential hypertension 04/20/2015  . Long-term use of high-risk medication 11/19/2014  . Cephalalgia 10/02/2014  . Postmenopausal atrophic vaginitis 09/15/2014  . Encounter for Medicare annual wellness exam 09/15/2014  . History of candidiasis 09/15/2014  . Generalized anxiety disorder 09/15/2014  . S/P hysterectomy 09/12/2014  . B12 deficiency 06/22/2014  . Vitamin D deficiency 06/22/2014  . OSA (obstructive sleep apnea) 06/22/2014  . 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 Hyperlipidemia LDL goal <130 04/09/2008    Past Surgical History:  Procedure Laterality Date  . ABDOMINAL HYSTERECTOMY  1979  . VAGINAL HYSTERECTOMY  1979    Family History  Problem Relation Age of Onset  . Hypertension Mother   . Alzheimer's disease Mother   . Cancer Father 81       prostate cancer  . Cancer Sister 1       breast cancer  . Breast cancer Sister 67  . Aneurysm Sister   . Diabetes Brother        type 2  . Heart disease  Sister        cabg x 4, tobacco abuse  . Diabetes Sister        type 2  . Lung cancer Nephew     Social History   Socioeconomic History  . Marital status: Married    Spouse name: Rip Harbour  . Number of children: 2  . Years of education: Not on file  . Highest education level: 12th grade  Occupational History  . Occupation: Retired  Scientific laboratory technician  . Financial resource strain: Not hard at all  . Food insecurity    Worry: Never true    Inability: Never true  . Transportation needs    Medical: No     Non-medical: No  Tobacco Use  . Smoking status: Never Smoker  . Smokeless tobacco: Never Used  . Tobacco comment: smoking cessation materials not required  Substance and Sexual Activity  . Alcohol use: Yes    Alcohol/week: 1.0 standard drinks    Types: 1 Glasses of wine per week    Comment: rare  . Drug use: No  . Sexual activity: Yes  Lifestyle  . Physical activity    Days per week: 0 days    Minutes per session: 0 min  . Stress: To some extent  Relationships  . Social Herbalist on phone: Patient refused    Gets together: Patient refused    Attends religious service: Patient refused    Active member of club or organization: Patient refused    Attends meetings of clubs or organizations: Patient refused    Relationship status: Married  . Intimate partner violence    Fear of current or ex partner: No    Emotionally abused: No    Physically abused: No    Forced sexual activity: No  Other Topics Concern  . Not on file  Social History Narrative  . Not on file     Current Outpatient Medications:  .  acyclovir (ZOVIRAX) 400 MG tablet, TAKE 1 TABLET DAILY, Disp: 90 tablet, Rfl: 3 .  amLODipine (NORVASC) 5 MG tablet, TAKE 1 TABLET AT BEDTIME, Disp: 90 tablet, Rfl: 2 .  atorvastatin (LIPITOR) 40 MG tablet, TAKE 1 TABLET AT BEDTIME, Disp: 30 tablet, Rfl: 11 .  citalopram (CELEXA) 20 MG tablet, TAKE 1 TABLET DAILY (USE THIS NEW LOWER DOSE, REPLACES 40 MG STRENGTH), Disp: 90 tablet, Rfl: 1 .  estradiol (ESTRACE VAGINAL) 0.1 MG/GM vaginal cream, Apply 0.5mg  (pea-sized amount)  just inside the vaginal introitus with a finger-tip on Monday, Wednesday and Friday nights., Disp: 30 g, Rfl: 12 .  levothyroxine (SYNTHROID) 112 MCG tablet, TAKE 1 TABLET DAILY BEFORE BREAKFAST, Disp: 90 tablet, Rfl: 0 .  vitamin B-12 (CYANOCOBALAMIN) 100 MCG tablet, Take 500 mcg by mouth daily., Disp: , Rfl:  .  Calcium Acetate-Magnesium Carb (MAGNEBIND 200 PO), Take 250 mg by mouth daily., Disp: ,  Rfl:  .  cholecalciferol (VITAMIN D3) 25 MCG (1000 UT) tablet, Take 5,000 Units by mouth daily. , Disp: , Rfl:   No Known Allergies  I personally reviewed active problem list, medication list, allergies, health maintenance, notes from last encounter, lab results with the patient/caregiver today.   ROS  Constitutional: Negative for fever or weight change.  Respiratory: Negative for cough and shortness of breath.   Cardiovascular: Negative for chest pain or palpitations.  Gastrointestinal: Negative for abdominal pain, no bowel changes.  Musculoskeletal: Negative for gait problem or joint swelling.  Skin: Negative for rash.  Neurological: Negative for  dizziness or headache.  No other specific complaints in a complete review of systems (except as listed in HPI above).  Objective  Vitals:   08/04/19 0900  BP: 124/82  Pulse: 64  Resp: 16  Temp: 97.6 F (36.4 C)  TempSrc: Oral  SpO2: 98%  Weight: 197 lb 3.2 oz (89.4 kg)  Height: 5\' 6"  (1.676 m)    Body mass index is 31.83 kg/m.  Physical Exam  Constitutional: Patient appears well-developed and well-nourished. No distress.  HENT: Head: Normocephalic and atraumatic. Eyes: Conjunctivae and EOM are normal. No scleral icterus.   Neck: Normal range of motion. Neck supple. No JVD present. No thyromegaly present.  Cardiovascular: Normal rate, regular rhythm and normal heart sounds.  No murmur heard. No BLE edema. Pulmonary/Chest: Effort normal and breath sounds normal. No respiratory distress. Abdominal: Soft. Bowel sounds are normal, no distension. There is no tenderness. No masses.  No CVA tenderness. Musculoskeletal: Normal range of motion, no joint effusions. No gross deformities Neurological: Pt is alert and oriented to person, place, and time. No cranial nerve deficit. Coordination, balance, strength, speech and gait are normal.  Skin: Skin is warm and dry. No rash noted. No erythema.  Psychiatric: Patient has a normal mood  and affect. behavior is normal. Judgment and thought content normal.   Results for orders placed or performed in visit on 08/04/19 (from the past 72 hour(s))  POCT urinalysis dipstick     Status: Abnormal   Collection Time: 08/04/19  9:25 AM  Result Value Ref Range   Color, UA gold    Clarity, UA cloudy    Glucose, UA Negative Negative   Bilirubin, UA negative    Ketones, UA negative    Spec Grav, UA 1.015 1.010 - 1.025   Blood, UA large    pH, UA 5.0 5.0 - 8.0   Protein, UA Positive (A) Negative   Urobilinogen, UA negative (A) 0.2 or 1.0 E.U./dL   Nitrite, UA positve    Leukocytes, UA Large (3+) (A) Negative   Appearance cloudy    Odor none     PHQ2/9: Depression screen [facility] 2/9 08/04/2019 01/09/2019 08/29/2018 08/23/2018 05/02/2018  Decreased Interest 0 0 0 0 0  Down, Depressed, Hopeless 0 0 0 0 2  PHQ - 2 Score 0 0 0 0 2  Altered sleeping 0 0 0 0 0  Tired, decreased energy 0 0 0 0 3  Change in appetite 0 0 0 0 3  Feeling bad or failure about yourself  0 0 0 0 3  Trouble concentrating 0 0 0 0 0  Moving slowly or fidgety/restless 0 0 0 0 0  Suicidal thoughts - 0 0 0 0  PHQ-9 Score 0 0 0 0 11  Difficult doing work/chores Not difficult at all Not difficult at all Not difficult at all Not difficult at all Not difficult at all   PHQ-2/9 Result is negative.    Fall Risk: Fall Risk  08/04/2019 01/09/2019 08/29/2018 08/23/2018 05/02/2018  Falls in the past year? 0 0 No No No  Comment - - - - -  Number falls in past yr: 0 0 - - -  Injury with Fall? 0 0 - - -  Risk for fall due to : - - - Other (Comment) -  Risk for fall due to: Comment - - - N/A -  Follow up Falls evaluation completed - - - -    Assessment & Plan  1. Acute cystitis with hematuria - POCT  urinalysis dipstick - Urine Culture - sulfamethoxazole-trimethoprim (BACTRIM DS) 800-160 MG tablet; Take 1 tablet by mouth 2 (two) times daily for 3 days.  Dispense: 6 tablet; Refill: 0  2. Essential hypertension - DASH diet  - amLODipine (NORVASC) 5 MG tablet; Take 1 tablet (5 mg total) by mouth at bedtime.  Dispense: 90 tablet; Refill: 2  3. Hyperlipidemia LDL goal <130 - Taking statin therapy; doing keto - discussed risk of increased cholesterol with this diet.  4. Obesity (BMI 30.0-34.9) - Discussed importance of 150 minutes of physical activity weekly, eat two servings of fish weekly, eat one serving of tree nuts ( cashews, pistachios, pecans, almonds.Marland Kitchen) every other day, eat 6 servings of fruit/vegetables daily and drink plenty of water and avoid sweet beverages.   5. Chronic left shoulder pain - Wants to try chiropractor and acupuncture first, will call [facility] when ready to surgical eval.  6. Hypothyroidism, postradioiodine therapy - Stable for years; doing well on current dosing of synthroid 178mcg  7. OSA (obstructive sleep apnea) - Does not want to use CPAP, doing better with weight loss  8. Vitamin D deficiency - Start supplementation  9. B12 deficiency - Restart supplementation  10. Generalized anxiety disorder - Doing well on lower dose celexa

## 2019-08-04 NOTE — Patient Instructions (Signed)
Please check with your insurance company about where to get your shingles shot and call for a nurse visit.

## 2019-08-04 NOTE — Progress Notes (Deleted)
poct

## 2019-08-07 LAB — URINE CULTURE
MICRO NUMBER:: 872337
SPECIMEN QUALITY:: ADEQUATE

## 2019-08-15 DIAGNOSIS — M25512 Pain in left shoulder: Secondary | ICD-10-CM | POA: Diagnosis not present

## 2019-08-15 DIAGNOSIS — M7542 Impingement syndrome of left shoulder: Secondary | ICD-10-CM | POA: Diagnosis not present

## 2019-08-25 ENCOUNTER — Ambulatory Visit: Payer: Medicare HMO

## 2019-08-28 DIAGNOSIS — M7542 Impingement syndrome of left shoulder: Secondary | ICD-10-CM | POA: Diagnosis not present

## 2019-08-28 DIAGNOSIS — M25512 Pain in left shoulder: Secondary | ICD-10-CM | POA: Diagnosis not present

## 2019-08-31 DIAGNOSIS — M7542 Impingement syndrome of left shoulder: Secondary | ICD-10-CM | POA: Diagnosis not present

## 2019-09-01 ENCOUNTER — Telehealth: Payer: Self-pay | Admitting: Family Medicine

## 2019-09-01 DIAGNOSIS — Z01818 Encounter for other preprocedural examination: Secondary | ICD-10-CM

## 2019-09-01 NOTE — Telephone Encounter (Signed)
Please let Julie Perez know that she needs to come in for labs for her pre-op clearance.  No visit necessary - was just seen 1 month ago - unless any changes to medical history or medications.  She is considered moderate risk due to her sleep apnea, age, history of mild cardiomegaly, and obesity.  Will clear pending labs.

## 2019-09-02 LAB — CBC WITH DIFFERENTIAL/PLATELET
Absolute Monocytes: 618 cells/uL (ref 200–950)
Basophils Absolute: 44 cells/uL (ref 0–200)
Basophils Relative: 0.5 %
Eosinophils Absolute: 122 cells/uL (ref 15–500)
Eosinophils Relative: 1.4 %
HCT: 38.6 % (ref 35.0–45.0)
Hemoglobin: 13.2 g/dL (ref 11.7–15.5)
Lymphs Abs: 2471 cells/uL (ref 850–3900)
MCH: 31 pg (ref 27.0–33.0)
MCHC: 34.2 g/dL (ref 32.0–36.0)
MCV: 90.6 fL (ref 80.0–100.0)
MPV: 11.3 fL (ref 7.5–12.5)
Monocytes Relative: 7.1 %
Neutro Abs: 5446 cells/uL (ref 1500–7800)
Neutrophils Relative %: 62.6 %
Platelets: 228 10*3/uL (ref 140–400)
RBC: 4.26 10*6/uL (ref 3.80–5.10)
RDW: 13 % (ref 11.0–15.0)
Total Lymphocyte: 28.4 %
WBC: 8.7 10*3/uL (ref 3.8–10.8)

## 2019-09-02 LAB — COMPLETE METABOLIC PANEL WITH GFR
AG Ratio: 1.9 (calc) (ref 1.0–2.5)
ALT: 18 U/L (ref 6–29)
AST: 16 U/L (ref 10–35)
Albumin: 4.3 g/dL (ref 3.6–5.1)
Alkaline phosphatase (APISO): 50 U/L (ref 37–153)
BUN: 23 mg/dL (ref 7–25)
CO2: 28 mmol/L (ref 20–32)
Calcium: 9.1 mg/dL (ref 8.6–10.4)
Chloride: 105 mmol/L (ref 98–110)
Creat: 0.86 mg/dL (ref 0.60–0.93)
GFR, Est African American: 78 mL/min/{1.73_m2} (ref >=60)
GFR, Est Non African American: 67 mL/min/{1.73_m2} (ref >=60)
Globulin: 2.3 g/dL (calc) (ref 1.9–3.7)
Glucose, Bld: 88 mg/dL (ref 65–99)
Potassium: 4 mmol/L (ref 3.5–5.3)
Sodium: 140 mmol/L (ref 135–146)
Total Bilirubin: 0.9 mg/dL (ref 0.2–1.2)
Total Protein: 6.6 g/dL (ref 6.1–8.1)

## 2019-09-02 LAB — HEMOGLOBIN A1C
Hgb A1c MFr Bld: 5.4 % of total Hgb (ref <5.7)
Mean Plasma Glucose: 108 (calc)
eAG (mmol/L): 6 (calc)

## 2019-09-04 NOTE — Telephone Encounter (Signed)
Patient came in on 10/9

## 2019-09-05 DIAGNOSIS — M720 Palmar fascial fibromatosis [Dupuytren]: Secondary | ICD-10-CM | POA: Diagnosis not present

## 2019-09-05 DIAGNOSIS — Z01818 Encounter for other preprocedural examination: Secondary | ICD-10-CM | POA: Diagnosis not present

## 2019-09-05 DIAGNOSIS — M7542 Impingement syndrome of left shoulder: Secondary | ICD-10-CM | POA: Diagnosis not present

## 2019-09-06 ENCOUNTER — Other Ambulatory Visit: Payer: Self-pay

## 2019-09-06 ENCOUNTER — Encounter: Payer: Self-pay | Admitting: *Deleted

## 2019-09-10 ENCOUNTER — Other Ambulatory Visit: Payer: Self-pay | Admitting: Orthopedic Surgery

## 2019-09-11 ENCOUNTER — Other Ambulatory Visit
Admission: RE | Admit: 2019-09-11 | Discharge: 2019-09-11 | Disposition: A | Discharge status: Home / Self Care | Payer: Medicare HMO | Source: Ambulatory Visit | Attending: Orthopedic Surgery | Admitting: Orthopedic Surgery

## 2019-09-11 ENCOUNTER — Other Ambulatory Visit: Payer: Self-pay

## 2019-09-11 DIAGNOSIS — Z20828 Contact with and (suspected) exposure to other viral communicable diseases: Secondary | ICD-10-CM | POA: Diagnosis not present

## 2019-09-11 DIAGNOSIS — Z01812 Encounter for preprocedural laboratory examination: Secondary | ICD-10-CM | POA: Insufficient documentation

## 2019-09-11 LAB — SARS CORONAVIRUS 2 (TAT 6-24 HRS): SARS Coronavirus 2: NEGATIVE

## 2019-09-14 ENCOUNTER — Ambulatory Visit: Payer: Medicare HMO | Admitting: Anesthesiology

## 2019-09-14 ENCOUNTER — Ambulatory Visit
Admission: RE | Admit: 2019-09-14 | Discharge: 2019-09-14 | Disposition: A | Discharge status: Home / Self Care | Payer: Medicare HMO | Attending: Orthopedic Surgery | Admitting: Orthopedic Surgery

## 2019-09-14 ENCOUNTER — Other Ambulatory Visit: Payer: Self-pay

## 2019-09-14 ENCOUNTER — Encounter
Admission: RE | Disposition: A | Discharge status: Home / Self Care | Payer: Self-pay | Source: Home / Self Care | Attending: Orthopedic Surgery

## 2019-09-14 DIAGNOSIS — Z79899 Other long term (current) drug therapy: Secondary | ICD-10-CM | POA: Insufficient documentation

## 2019-09-14 DIAGNOSIS — Z79890 Hormone replacement therapy (postmenopausal): Secondary | ICD-10-CM | POA: Diagnosis not present

## 2019-09-14 DIAGNOSIS — M75112 Incomplete rotator cuff tear or rupture of left shoulder, not specified as traumatic: Secondary | ICD-10-CM | POA: Diagnosis not present

## 2019-09-14 DIAGNOSIS — M7552 Bursitis of left shoulder: Secondary | ICD-10-CM | POA: Diagnosis not present

## 2019-09-14 DIAGNOSIS — Z6832 Body mass index (BMI) 32.0-32.9, adult: Secondary | ICD-10-CM | POA: Insufficient documentation

## 2019-09-14 DIAGNOSIS — G4733 Obstructive sleep apnea (adult) (pediatric): Secondary | ICD-10-CM | POA: Insufficient documentation

## 2019-09-14 DIAGNOSIS — M7542 Impingement syndrome of left shoulder: Secondary | ICD-10-CM | POA: Insufficient documentation

## 2019-09-14 DIAGNOSIS — I10 Essential (primary) hypertension: Secondary | ICD-10-CM | POA: Insufficient documentation

## 2019-09-14 DIAGNOSIS — E039 Hypothyroidism, unspecified: Secondary | ICD-10-CM | POA: Insufficient documentation

## 2019-09-14 DIAGNOSIS — E669 Obesity, unspecified: Secondary | ICD-10-CM | POA: Insufficient documentation

## 2019-09-14 DIAGNOSIS — E7800 Pure hypercholesterolemia, unspecified: Secondary | ICD-10-CM | POA: Insufficient documentation

## 2019-09-14 DIAGNOSIS — M25512 Pain in left shoulder: Secondary | ICD-10-CM | POA: Diagnosis not present

## 2019-09-14 DIAGNOSIS — G8918 Other acute postprocedural pain: Secondary | ICD-10-CM | POA: Diagnosis not present

## 2019-09-14 DIAGNOSIS — M66812 Spontaneous rupture of other tendons, left shoulder: Secondary | ICD-10-CM | POA: Diagnosis not present

## 2019-09-14 HISTORY — DX: Thyrotoxicosis, unspecified without thyrotoxic crisis or storm: E05.90

## 2019-09-14 HISTORY — PX: SHOULDER ARTHROSCOPY WITH ROTATOR CUFF REPAIR: SHX5685

## 2019-09-14 SURGERY — ARTHROSCOPY, SHOULDER, WITH ROTATOR CUFF REPAIR
Anesthesia: General | Site: Shoulder | Laterality: Left

## 2019-09-14 MED ORDER — ONDANSETRON HCL 4 MG PO TABS: 4.0000 mg | ORAL_TABLET | Freq: Four times a day (QID) | ORAL | Status: DC | PRN

## 2019-09-14 MED ORDER — LACTATED RINGERS IV SOLN
100.0000 mL/h | INTRAVENOUS | Status: DC
  Administered 2019-09-14: 100 mL/h via INTRAVENOUS

## 2019-09-14 MED ORDER — MIDAZOLAM HCL 2 MG/2ML IJ SOLN
INTRAMUSCULAR | Status: DC | PRN
  Administered 2019-09-14: 2 mg via INTRAVENOUS

## 2019-09-14 MED ORDER — MORPHINE SULFATE (PF) 2 MG/ML IV SOLN: 0.5000 mg | INTRAVENOUS | Status: DC | PRN

## 2019-09-14 MED ORDER — KETOROLAC TROMETHAMINE 15 MG/ML IJ SOLN: 7.5000 mg | Freq: Four times a day (QID) | INTRAMUSCULAR | Status: DC

## 2019-09-14 MED ORDER — LIDOCAINE HCL (CARDIAC) PF 100 MG/5ML IV SOSY
PREFILLED_SYRINGE | INTRAVENOUS | Status: DC | PRN
  Administered 2019-09-14: 40 mg via INTRATRACHEAL

## 2019-09-14 MED ORDER — FENTANYL CITRATE (PF) 100 MCG/2ML IJ SOLN
INTRAMUSCULAR | Status: DC | PRN
  Administered 2019-09-14: 50 ug via INTRAVENOUS

## 2019-09-14 MED ORDER — ACETAMINOPHEN 160 MG/5ML PO SOLN: 325.0000 mg | ORAL | Status: DC | PRN

## 2019-09-14 MED ORDER — ACETAMINOPHEN 325 MG PO TABS: 325.0000 mg | ORAL_TABLET | Freq: Four times a day (QID) | ORAL | Status: DC | PRN

## 2019-09-14 MED ORDER — CHLORHEXIDINE GLUCONATE 4 % EX LIQD
60.0000 mL | Freq: Once | CUTANEOUS | Status: AC
  Administered 2019-09-14: 4 via TOPICAL

## 2019-09-14 MED ORDER — HYDROCODONE-ACETAMINOPHEN 5-325 MG PO TABS: 1.0000 | ORAL_TABLET | ORAL | 0 refills | Status: DC | PRN

## 2019-09-14 MED ORDER — ACETAMINOPHEN 325 MG PO TABS: 325.0000 mg | ORAL_TABLET | ORAL | Status: DC | PRN

## 2019-09-14 MED ORDER — EPHEDRINE SULFATE 50 MG/ML IJ SOLN
INTRAMUSCULAR | Status: DC | PRN
  Administered 2019-09-14: 5 mg via INTRAVENOUS
  Administered 2019-09-14: 10 mg via INTRAVENOUS

## 2019-09-14 MED ORDER — METOCLOPRAMIDE HCL 5 MG PO TABS: 5.0000 mg | ORAL_TABLET | Freq: Three times a day (TID) | ORAL | Status: DC | PRN

## 2019-09-14 MED ORDER — DOCUSATE SODIUM 100 MG PO CAPS: 100.0000 mg | ORAL_CAPSULE | Freq: Every day | ORAL | 2 refills | Status: DC | PRN

## 2019-09-14 MED ORDER — METOCLOPRAMIDE HCL 5 MG/ML IJ SOLN: 5.0000 mg | Freq: Three times a day (TID) | INTRAMUSCULAR | Status: DC | PRN

## 2019-09-14 MED ORDER — CEFAZOLIN SODIUM-DEXTROSE 2-4 GM/100ML-% IV SOLN
2.0000 g | INTRAVENOUS | Status: AC
  Administered 2019-09-14: 2 g via INTRAVENOUS

## 2019-09-14 MED ORDER — OXYCODONE HCL 5 MG PO TABS: 5.0000 mg | ORAL_TABLET | Freq: Once | ORAL | Status: DC | PRN

## 2019-09-14 MED ORDER — OXYCODONE HCL 5 MG/5ML PO SOLN: 5.0000 mg | Freq: Once | ORAL | Status: DC | PRN

## 2019-09-14 MED ORDER — DEXAMETHASONE SODIUM PHOSPHATE 4 MG/ML IJ SOLN
INTRAMUSCULAR | Status: DC | PRN
  Administered 2019-09-14: 4 mg via INTRAVENOUS

## 2019-09-14 MED ORDER — ONDANSETRON HCL 4 MG/2ML IJ SOLN: 4.0000 mg | Freq: Four times a day (QID) | INTRAMUSCULAR | Status: DC | PRN

## 2019-09-14 MED ORDER — BUPIVACAINE LIPOSOME 1.3 % IJ SUSP
INTRAMUSCULAR | Status: DC | PRN
  Administered 2019-09-14: 20 mL via PERINEURAL

## 2019-09-14 MED ORDER — GLYCOPYRROLATE 0.2 MG/ML IJ SOLN
INTRAMUSCULAR | Status: DC | PRN
  Administered 2019-09-14: 0.1 mg via INTRAVENOUS

## 2019-09-14 MED ORDER — HYDROCODONE-ACETAMINOPHEN 5-325 MG PO TABS: 1.0000 | ORAL_TABLET | ORAL | Status: DC | PRN

## 2019-09-14 MED ORDER — HYDROCODONE-ACETAMINOPHEN 7.5-325 MG PO TABS: 1.0000 | ORAL_TABLET | ORAL | Status: DC | PRN

## 2019-09-14 MED ORDER — PROPOFOL 10 MG/ML IV BOLUS
INTRAVENOUS | Status: DC | PRN
  Administered 2019-09-14: 130 mg via INTRAVENOUS

## 2019-09-14 MED ORDER — ONDANSETRON HCL 4 MG/2ML IJ SOLN
INTRAMUSCULAR | Status: DC | PRN
  Administered 2019-09-14: 4 mg via INTRAVENOUS

## 2019-09-14 MED ORDER — FENTANYL CITRATE (PF) 100 MCG/2ML IJ SOLN: 25.0000 ug | INTRAMUSCULAR | Status: DC | PRN

## 2019-09-14 MED ORDER — ONDANSETRON HCL 4 MG/2ML IJ SOLN
4.0000 mg | Freq: Once | INTRAMUSCULAR | Status: AC | PRN
  Administered 2019-09-14: 4 mg via INTRAVENOUS

## 2019-09-14 MED ORDER — BUPIVACAINE HCL (PF) 0.5 % IJ SOLN
INTRAMUSCULAR | Status: DC | PRN
  Administered 2019-09-14: 20 mL via PERINEURAL

## 2019-09-14 MED ORDER — LACTATED RINGERS IV SOLN: INTRAVENOUS | Status: DC

## 2019-09-14 SURGICAL SUPPLY — 58 items
ADAPTER IRRIG TUBE 2 SPIKE SOL (ADAPTER) ×4 IMPLANT
ANCHOR SUT CROSSFT 4.75 (Anchor) ×2 IMPLANT
BLADE FULL RADIUS 3.5 (BLADE) ×2 IMPLANT
BLADE INCISOR PLUS 4.5 (BLADE) ×2 IMPLANT
BLADE SURG MINI STRL (BLADE) ×2 IMPLANT
BRUSH SCRUB EZ  4% CHG (MISCELLANEOUS) ×1
BRUSH SCRUB EZ 4% CHG (MISCELLANEOUS) ×1 IMPLANT
BUR ACROMIONIZER 4.0 (BURR) ×2 IMPLANT
BUR BR 5.5 WIDE MOUTH (BURR) ×2 IMPLANT
CANNULA SHOULDER 7CM (CANNULA) ×2 IMPLANT
CANNULA TWIST IN 8.25X7CM (CANNULA) ×4 IMPLANT
CHLORAPREP W/TINT 26 (MISCELLANEOUS) ×2 IMPLANT
COOLER POLAR GLACIER W/PUMP (MISCELLANEOUS) ×2 IMPLANT
COVER WAND RF STERILE (DRAPES) ×2 IMPLANT
DEVICE SUCT BLK HOLE OR FLOOR (MISCELLANEOUS) ×2 IMPLANT
DRAPE IMP U-DRAPE 54X76 (DRAPES) ×4 IMPLANT
DRAPE SHEET LG 3/4 BI-LAMINATE (DRAPES) ×2 IMPLANT
DRAPE STERI 35X30 U-POUCH (DRAPES) ×2 IMPLANT
ELECT REM PT RETURN 9FT ADLT (ELECTROSURGICAL) ×2
ELECTRODE REM PT RTRN 9FT ADLT (ELECTROSURGICAL) ×1 IMPLANT
GAUZE 4X4 16PLY RFD (DISPOSABLE) ×2 IMPLANT
GAUZE SPONGE 4X4 12PLY STRL (GAUZE/BANDAGES/DRESSINGS) ×2 IMPLANT
GAUZE XEROFORM 1X8 LF (GAUZE/BANDAGES/DRESSINGS) ×4 IMPLANT
GLOVE BIOGEL PI IND STRL 8 (GLOVE) ×1 IMPLANT
GLOVE BIOGEL PI INDICATOR 8 (GLOVE) ×1
GLOVE SURG ORTHO 8.0 STRL STRW (GLOVE) ×2 IMPLANT
GOWN STRL REUS W/ TWL LRG LVL3 (GOWN DISPOSABLE) ×1 IMPLANT
GOWN STRL REUS W/ TWL XL LVL3 (GOWN DISPOSABLE) ×1 IMPLANT
GOWN STRL REUS W/TWL LRG LVL3 (GOWN DISPOSABLE) ×1
GOWN STRL REUS W/TWL XL LVL3 (GOWN DISPOSABLE) ×1
IV LACTATED RINGER IRRG 3000ML (IV SOLUTION) ×6
IV LR IRRIG 3000ML ARTHROMATIC (IV SOLUTION) ×6 IMPLANT
KIT STABILIZATION SHOULDER (MISCELLANEOUS) ×2 IMPLANT
KIT TURNOVER KIT A (KITS) ×2 IMPLANT
MANIFOLD NEPTUNE II (INSTRUMENTS) ×4 IMPLANT
MASK FACE SPIDER DISP (MASK) ×2 IMPLANT
MAT ABSORB  FLUID 56X50 GRAY (MISCELLANEOUS) ×1
MAT ABSORB FLUID 56X50 GRAY (MISCELLANEOUS) ×1 IMPLANT
NDL SAFETY ECLIPSE 18X1.5 (NEEDLE) ×1 IMPLANT
NEEDLE HYPO 18GX1.5 SHARP (NEEDLE) ×1
NEEDLE HYPO 22GX1.5 SAFETY (NEEDLE) ×2 IMPLANT
NEEDLE SCORPION MULTI FIRE (NEEDLE) ×2 IMPLANT
NEEDLE SPNL 18GX3.5 QUINCKE PK (NEEDLE) ×2 IMPLANT
PACK ARTHROSCOPY SHOULDER (MISCELLANEOUS) ×2 IMPLANT
PAD ABD DERMACEA PRESS 5X9 (GAUZE/BANDAGES/DRESSINGS) ×2 IMPLANT
PAD WRAPON POLAR SHDR UNIV (MISCELLANEOUS) ×1 IMPLANT
SLING ARM LRG DEEP (SOFTGOODS) ×2 IMPLANT
SLING ULTRA II M (MISCELLANEOUS) ×2 IMPLANT
SUT ETHILON 3-0 FS-10 30 BLK (SUTURE) ×2
SUTURE EHLN 3-0 FS-10 30 BLK (SUTURE) ×1 IMPLANT
SYR 10ML LL (SYRINGE) ×2 IMPLANT
SYR 50ML LL SCALE MARK (SYRINGE) ×2 IMPLANT
TAPE HI-FI 2MM BLUE (SUTURE) ×2 IMPLANT
TAPE MICROFOAM 4IN (TAPE) ×2 IMPLANT
TUBING ARTHRO INFLOW-ONLY STRL (TUBING) ×2 IMPLANT
TUBING CONNECTING 10 (TUBING) ×2 IMPLANT
WAND WEREWOLF FLOW 90D (MISCELLANEOUS) ×2 IMPLANT
WRAPON POLAR PAD SHDR UNIV (MISCELLANEOUS) ×2

## 2019-09-14 NOTE — Anesthesia Preprocedure Evaluation (Addendum)
Anesthesia Evaluation  Patient identified by MRN, date of birth, ID band Patient awake    Reviewed: Allergy & Precautions, H&P , NPO status , Patient's Chart, lab work & pertinent test results  Airway Mallampati: II  TM Distance: >3 FB Neck ROM: full    Dental no notable dental hx.    Pulmonary sleep apnea ,    Pulmonary exam normal breath sounds clear to auscultation       Cardiovascular hypertension, Normal cardiovascular exam Rhythm:regular Rate:Normal     Neuro/Psych  Headaches,    GI/Hepatic   Endo/Other  Hypothyroidism   Renal/GU      Musculoskeletal   Abdominal   Peds  Hematology   Anesthesia Other Findings   Reproductive/Obstetrics                             Anesthesia Physical Anesthesia Plan  ASA: III  Anesthesia Plan: General   Post-op Pain Management:  Regional for Post-op pain   Induction:   PONV Risk Score and Plan: 3 and Ondansetron, Dexamethasone, Midazolam and Treatment may vary due to age or medical condition  Airway Management Planned:   Additional Equipment:   Intra-op Plan:   Post-operative Plan:   Informed Consent: I have reviewed the patients History and Physical, chart, labs and discussed the procedure including the risks, benefits and alternatives for the proposed anesthesia with the patient or authorized representative who has indicated his/her understanding and acceptance.       Plan Discussed with: CRNA  Anesthesia Plan Comments:        Anesthesia Quick Evaluation

## 2019-09-14 NOTE — Discharge Instructions (Signed)
Wear sling at all times, including sleep.  You will need to use the sling for a total of 4 weeks following surgery.  Do not try and lift your arm up or away from your body for any reason.   Keep the dressing dry.  You may remove bandage in 3 days.  You may place Band-Aids over top of the incisions.  May shower once dressing is removed in 3 days.  Remove sling carefully only for showers, leaving arm down by your side while in the shower.  +++ Make sure to take some pain medication this evening before you fall asleep, in preparation for the nerve block wearing off in the middle of the night.  If the the pain medication causes itching, or is too strong, try taking a single tablet at a time, or combining with Benadryl.  You may be most comfortable sleeping in a recliner.  If you do sleep in near bed, placed pillows behind the shoulder that have the operation to support it.      Information for Discharge Teaching: EXPAREL (bupivacaine liposome injectable suspension)   Your surgeon or anesthesiologist gave you EXPAREL(bupivacaine) to help control your pain after surgery.   EXPAREL is a local anesthetic that provides pain relief by numbing the tissue around the surgical site.  EXPAREL is designed to release pain medication over time and can control pain for up to 72 hours.  Depending on how you respond to EXPAREL, you may require less pain medication during your recovery.  Possible side effects:  Temporary loss of sensation or ability to move in the area where bupivacaine was injected.  Nausea, vomiting, constipation  Rarely, numbness and tingling in your mouth or lips, lightheadedness, or anxiety may occur.  Call your doctor right away if you think you may be experiencing any of these sensations, or if you have other questions regarding possible side effects.  Follow all other discharge instructions given to you by your surgeon or nurse. Eat a healthy diet and drink plenty of water  or other fluids.  If you return to the hospital for any reason within 96 hours following the administration of EXPAREL, it is important for health care providers to know that you have received this anesthetic. A teal colored band has been placed on your arm with the date, time and amount of EXPAREL you have received in order to alert and inform your health care providers. Please leave this armband in place for the full 96 hours following administration, and then you may remove the band.  General Anesthesia, Adult, Care After This sheet gives you information about how to care for yourself after your procedure. Your health care provider may also give you more specific instructions. If you have problems or questions, contact your health care provider. What can I expect after the procedure? After the procedure, the following side effects are common:  Pain or discomfort at the IV site.  Nausea.  Vomiting.  Sore throat.  Trouble concentrating.  Feeling cold or chills.  Weak or tired.  Sleepiness and fatigue.  Soreness and body aches. These side effects can affect parts of the body that were not involved in surgery. Follow these instructions at home:  For at least 24 hours after the procedure:  Have a responsible adult stay with you. It is important to have someone help care for you until you are awake and alert.  Rest as needed.  Do not: ? Participate in activities in which you could fall or  become injured. ? Drive. ? Use heavy machinery. ? Drink alcohol. ? Take sleeping pills or medicines that cause drowsiness. ? Make important decisions or sign legal documents. ? Take care of children on your own. Eating and drinking  Follow any instructions from your health care provider about eating or drinking restrictions.  When you feel hungry, start by eating small amounts of foods that are soft and easy to digest (bland), such as toast. Gradually return to your regular diet.  Drink  enough fluid to keep your urine pale yellow.  If you vomit, rehydrate by drinking water, juice, or clear broth. General instructions  If you have sleep apnea, surgery and certain medicines can increase your risk for breathing problems. Follow instructions from your health care provider about wearing your sleep device: ? Anytime you are sleeping, including during daytime naps. ? While taking prescription pain medicines, sleeping medicines, or medicines that make you drowsy.  Return to your normal activities as told by your health care provider. Ask your health care provider what activities are safe for you.  Take over-the-counter and prescription medicines only as told by your health care provider.  If you smoke, do not smoke without supervision.  Keep all follow-up visits as told by your health care provider. This is important. Contact a health care provider if:  You have nausea or vomiting that does not get better with medicine.  You cannot eat or drink without vomiting.  You have pain that does not get better with medicine.  You are unable to pass urine.  You develop a skin rash.  You have a fever.  You have redness around your IV site that gets worse. Get help right away if:  You have difficulty breathing.  You have chest pain.  You have blood in your urine or stool, or you vomit blood. Summary  After the procedure, it is common to have a sore throat or nausea. It is also common to feel tired.  Have a responsible adult stay with you for the first 24 hours after general anesthesia. It is important to have someone help care for you until you are awake and alert.  When you feel hungry, start by eating small amounts of foods that are soft and easy to digest (bland), such as toast. Gradually return to your regular diet.  Drink enough fluid to keep your urine pale yellow.  Return to your normal activities as told by your health care provider. Ask your health care provider  what activities are safe for you. This information is not intended to replace advice given to you by your health care provider. Make sure you discuss any questions you have with your health care provider. Document Released: 02/15/2001 Document Revised: 11/12/2017 Document Reviewed: 06/25/2017 Elsevier Patient Education  2020 [employer].

## 2019-09-14 NOTE — Anesthesia Procedure Notes (Signed)
Procedure Name: LMA Insertion Date/Time: 09/14/2019 12:39 PM Performed by: Mayme Genta, CRNA Pre-anesthesia Checklist: Patient identified, Emergency Drugs available, Suction available, Timeout performed and Patient being monitored Patient Re-evaluated:Patient Re-evaluated prior to induction Oxygen Delivery Method: Circle system utilized Preoxygenation: Pre-oxygenation with 100% oxygen Induction Type: IV induction LMA: LMA inserted LMA Size: 4.0 Number of attempts: 1 Placement Confirmation: positive ETCO2 and breath sounds checked- equal and bilateral Tube secured with: Tape

## 2019-09-14 NOTE — Op Note (Signed)
09/14/2019  2:31 PM  PATIENT:  Julie Perez  73 y.o. 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    The risks benefits and alternatives were discussed with the patient preoperatively including but not limited to the risks of infection, bleeding, nerve injury, persistent pain or weakness, failure of the hardware, re-tear of the rotator cuff and the need for further surgery. Medical risks include DVT and pulmonary embolism, myocardial infarction, stroke, pneumonia, respiratory failure and death. Patient understood these risks and wished to proceed.  OPERATIVE IMPLANTS: Conmed System  OPERATIVE PROCEDURE: The patient was met in the preoperative area. The left shoulder was signed with my initials according the hospital's correct site of surgery protocol. The patient is brought to the OR and underwent a supraclavicular block and general endotracheal intubation by the anesthesia service.  The patient was placed in a beachchair position.  A spider arm positioner was used for this case. Examination under anesthesia revealed negative sulcus and FF of 120 degrees and abduction of 90 degrees. There no anterior or posterior instability.  The patient was prepped and draped in a sterile fashion. A timeout was performed to verify the patient's name, date of birth, medical record number, correct site of surgery and  correct procedure to be performed there was also used to verify the patient received antibiotics that all appropriate instruments, implants and radiographs studies were available in the room. Once all in attendance were in agreement case began.  Bony landmarks were drawn out with a surgical marker along with proposed arthroscopy incisions.  An 11 blade was used to establish a posterior portal through which the arthroscope was placed in the glenohumeral joint. A full diagnostic examination of the shoulder was performed.  The anterior portal was established under direct visualization with an 18-gauge spinal needle.  A 5.75 mm arthroscopic cannula was placed through the anterior portal.   The intra-articular portion of the biceps tendon was found to have a partial tear involving greater than 50% of the diameter. Therefore the decision was made to perform a tenotomy. An arthroscopic wand was used to release the biceps tendon off the superior labrum. The arthroscopic shaver was then used to debride the frayed edges of the labrum. There were no anterior or superior labral tears seen.  Subscapularis tendon was intact. Patient had a partial-thickness tear involving the supraspinatus. There were loose bodies within the inferior recess and no evidence of HAGL lesion. The humeral head showed areas of cartilage loss superior and medial. The partially torn supraspinatus was debrided with the shaver and the loose bodies removed. A chondroplasty was also performed along the humeral head.  The arthroscope was then placed in the subacromial space. A lateral portal was then established using an 18-gauge spinal needle for localization.   The greater tuberosity was debrided using a 5.5 mm resector shaver blade to remove all remaining foreign fibers of the rotator cuff.  Debridement was performed until punctate bleeding was seen at  the greater tuberosity footprint, which will allow for rotator cuff healing.  Extensive bursitis was  encountered and debrided using a 4-0 resector shaver blade and a 90 ArthroCare wand from the lateral portal. Using the fiber tape a horizontal stitch was placed in the supraspinatus tendon. The tape was then  fixated on the lateral side with a SwiveLock anchor. The final construct was stable and moved as a unit with excellent coverage of the humeral head.  Using the 5.5 mm and 4.0 mm burr a subacromial decompression was performed from the lateral portal. The burr was then used to resect 8 mm of distal clavicle.  All incisions were copiously irrigated. Skin closure for the arthroscopic incisions was performed with 4-0 nylon.  A dry sterile dressing including Steri-Strips was applied .  The patient was placed in an abduction sling.  All sharp and instrument counts were correct at the conclusion of the case. I was scrubbed and present for the entire case. I spoke with the patient's family in the post-op consultation room and informed them that the case had been performed without complication and the patient was stable in recovery room.   Kurtis Bushman, MD

## 2019-09-14 NOTE — H&P (Signed)
The patient has been re-examined, and the chart reviewed, and there have been no interval changes to the documented history and physical.  Plan a left shoulder arthroscopy today.  Anesthesia is consulted regarding a peripheral nerve block for post-operative pain.  The risks, benefits, and alternatives have been discussed at length, and the patient is willing to proceed.     

## 2019-09-14 NOTE — Transfer of Care (Signed)
Immediate Anesthesia Transfer of Care Note  Patient: Julie Perez  Procedure(s) Performed: SHOULDER ARTHROSCOPY WITH ROTATOR CUFF REPAIR (Left Shoulder)  Patient Location: PACU  Anesthesia Type: General  Level of Consciousness: awake, alert  and patient cooperative  Airway and Oxygen Therapy: Patient Spontanous Breathing and Patient connected to supplemental oxygen  Post-op Assessment: Post-op Vital signs reviewed, Patient's Cardiovascular Status Stable, Respiratory Function Stable, Patent Airway and No signs of Nausea or vomiting  Post-op Vital Signs: Reviewed and stable  Complications: No apparent anesthesia complications

## 2019-09-14 NOTE — Anesthesia Procedure Notes (Signed)
Anesthesia Regional Block: Interscalene brachial plexus block   Pre-Anesthetic Checklist: ,, timeout performed, Correct Patient, Correct Site, Correct Laterality, Correct Procedure, Correct Position, site marked, Risks and benefits discussed,  Surgical consent,  Pre-op evaluation,  At surgeon's request and post-op pain management  Laterality: Left  Prep: chloraprep       Needles:  Injection technique: Single-shot  Needle Type: Stimiplex     Needle Length: 10cm  Needle Gauge: 21     Additional Needles:   Procedures:,,,, ultrasound used (permanent image in chart),,,,  Narrative:  Start time: 09/14/2019 11:33 AM End time: 09/14/2019 11:43 AM Injection made incrementally with aspirations every 5 mL.  Performed by: Personally  Anesthesiologist: Ronelle Nigh, MD  Additional Notes: Functioning IV was confirmed and monitors applied. Ultrasound guidance: relevant anatomy identified, needle position confirmed, local anesthetic spread visualized around nerve(s)., vascular puncture avoided.  Image printed for medical record.  Negative aspiration and no paresthesias; incremental administration of local anesthetic. The patient tolerated the procedure well. Vitals signes recorded in RN notes.

## 2019-09-14 NOTE — Anesthesia Postprocedure Evaluation (Signed)
Anesthesia Post Note  Patient: Julie Perez  Procedure(s) Performed: SHOULDER ARTHROSCOPY WITH ROTATOR CUFF REPAIR (Left Shoulder)  Patient location during evaluation: PACU Anesthesia Type: General Level of consciousness: awake and alert and oriented Pain management: satisfactory to patient Vital Signs Assessment: post-procedure vital signs reviewed and stable Respiratory status: spontaneous breathing, nonlabored ventilation and respiratory function stable Cardiovascular status: blood pressure returned to baseline and stable Postop Assessment: Adequate PO intake and No signs of nausea or vomiting Anesthetic complications: no    Raliegh Ip

## 2019-09-15 ENCOUNTER — Encounter: Payer: Self-pay | Admitting: Orthopedic Surgery

## 2019-09-18 ENCOUNTER — Other Ambulatory Visit: Payer: Self-pay

## 2019-09-19 MED ORDER — LEVOTHYROXINE SODIUM 112 MCG PO TABS: 112.0000 ug | ORAL_TABLET | Freq: Every day | ORAL | 1 refills | Status: DC

## 2019-09-21 DIAGNOSIS — M25512 Pain in left shoulder: Secondary | ICD-10-CM | POA: Diagnosis not present

## 2019-09-25 DIAGNOSIS — M25512 Pain in left shoulder: Secondary | ICD-10-CM | POA: Diagnosis not present

## 2019-09-28 DIAGNOSIS — Z9889 Other specified postprocedural states: Secondary | ICD-10-CM | POA: Diagnosis not present

## 2019-09-28 DIAGNOSIS — M25512 Pain in left shoulder: Secondary | ICD-10-CM | POA: Diagnosis not present

## 2019-10-02 ENCOUNTER — Other Ambulatory Visit: Payer: Self-pay

## 2019-10-02 DIAGNOSIS — M25512 Pain in left shoulder: Secondary | ICD-10-CM | POA: Diagnosis not present

## 2019-10-05 DIAGNOSIS — M25512 Pain in left shoulder: Secondary | ICD-10-CM | POA: Diagnosis not present

## 2019-10-09 DIAGNOSIS — M25512 Pain in left shoulder: Secondary | ICD-10-CM | POA: Diagnosis not present

## 2019-10-12 DIAGNOSIS — M25512 Pain in left shoulder: Secondary | ICD-10-CM | POA: Diagnosis not present

## 2019-10-18 DIAGNOSIS — M25512 Pain in left shoulder: Secondary | ICD-10-CM | POA: Diagnosis not present

## 2019-10-23 DIAGNOSIS — M25512 Pain in left shoulder: Secondary | ICD-10-CM | POA: Diagnosis not present

## 2019-10-25 ENCOUNTER — Telehealth: Payer: Self-pay | Admitting: Family Medicine

## 2019-10-25 NOTE — Telephone Encounter (Signed)
Medication Refill - Medication: citalopram (CELEXA) 20 MG tablet [id number]    Has the patient contacted their pharmacy? No. (Agent: If no, request that the patient contact the pharmacy for the refill.) (Agent: If yes, when and what did the pharmacy advise?)  Preferred Pharm[facility] (with phone number [facility]   Agent: Please be advised that RX refills may take up to 3 business days. We ask that you follow-up with your pharmacy.

## 2019-10-26 ENCOUNTER — Other Ambulatory Visit: Payer: Self-pay | Admitting: Emergency Medicine

## 2019-10-26 DIAGNOSIS — M25512 Pain in left shoulder: Secondary | ICD-10-CM | POA: Diagnosis not present

## 2019-10-26 NOTE — Telephone Encounter (Signed)
Order sent to [facility]

## 2019-10-29 MED ORDER — CITALOPRAM HYDROBROMIDE 20 MG PO TABS: ORAL_TABLET | ORAL | 1 refills | Status: DC

## 2019-10-30 DIAGNOSIS — M25512 Pain in left shoulder: Secondary | ICD-10-CM | POA: Diagnosis not present

## 2019-11-02 DIAGNOSIS — M25512 Pain in left shoulder: Secondary | ICD-10-CM | POA: Diagnosis not present

## 2019-11-09 DIAGNOSIS — M25512 Pain in left shoulder: Secondary | ICD-10-CM | POA: Diagnosis not present

## 2019-11-09 DIAGNOSIS — Z9889 Other specified postprocedural states: Secondary | ICD-10-CM | POA: Diagnosis not present

## 2019-11-13 DIAGNOSIS — M25512 Pain in left shoulder: Secondary | ICD-10-CM | POA: Diagnosis not present

## 2019-11-16 DIAGNOSIS — M25512 Pain in left shoulder: Secondary | ICD-10-CM | POA: Diagnosis not present

## 2019-12-04 ENCOUNTER — Ambulatory Visit: Payer: Medicare HMO | Admitting: Family Medicine

## 2019-12-06 ENCOUNTER — Ambulatory Visit: Payer: Medicare HMO | Admitting: Urology

## 2019-12-13 ENCOUNTER — Encounter: Payer: Self-pay | Admitting: Family Medicine

## 2019-12-13 ENCOUNTER — Ambulatory Visit (INDEPENDENT_AMBULATORY_CARE_PROVIDER_SITE_OTHER): Payer: Medicare HMO | Admitting: Family Medicine

## 2019-12-13 ENCOUNTER — Other Ambulatory Visit: Payer: Self-pay

## 2019-12-13 DIAGNOSIS — E538 Deficiency of other specified B group vitamins: Secondary | ICD-10-CM

## 2019-12-13 DIAGNOSIS — E669 Obesity, unspecified: Secondary | ICD-10-CM | POA: Diagnosis not present

## 2019-12-13 DIAGNOSIS — I10 Essential (primary) hypertension: Secondary | ICD-10-CM | POA: Diagnosis not present

## 2019-12-13 DIAGNOSIS — G4733 Obstructive sleep apnea (adult) (pediatric): Secondary | ICD-10-CM

## 2019-12-13 DIAGNOSIS — B009 Herpesviral infection, unspecified: Secondary | ICD-10-CM

## 2019-12-13 DIAGNOSIS — F411 Generalized anxiety disorder: Secondary | ICD-10-CM

## 2019-12-13 DIAGNOSIS — E890 Postprocedural hypothyroidism: Secondary | ICD-10-CM | POA: Diagnosis not present

## 2019-12-13 DIAGNOSIS — E785 Hyperlipidemia, unspecified: Secondary | ICD-10-CM | POA: Diagnosis not present

## 2019-12-13 DIAGNOSIS — E559 Vitamin D deficiency, unspecified: Secondary | ICD-10-CM

## 2019-12-13 MED ORDER — CITALOPRAM HYDROBROMIDE 20 MG PO TABS: ORAL_TABLET | ORAL | 0 refills | Status: DC

## 2019-12-13 MED ORDER — ATORVASTATIN CALCIUM 40 MG PO TABS: 40.0000 mg | ORAL_TABLET | Freq: Every day | ORAL | 3 refills | Status: DC

## 2019-12-13 MED ORDER — AMLODIPINE BESYLATE 5 MG PO TABS: 5.0000 mg | ORAL_TABLET | Freq: Every day | ORAL | 3 refills | Status: DC

## 2019-12-13 MED ORDER — LEVOTHYROXINE SODIUM 112 MCG PO TABS: 112.0000 ug | ORAL_TABLET | Freq: Every day | ORAL | 3 refills | Status: DC

## 2019-12-13 NOTE — Progress Notes (Signed)
Name: Julie Perez   MRN: [id number]    DOB: 02/07/1946   Date:12/13/2019       Progress Note  Subjective  Chief Complaint  Chief Complaint  Patient presents with  . Hypertension    follow up, medication refills  . Hyperlipidemia  . Hypothyroidism  . Follow-up    had rotator cuff surgery in October with Dr.B[facility]    I connected with  Baker Pierini on 12/13/19 at 10:40 AM EST by telephone and verified that I am speaking with the correct person using two identifiers.  I discussed the limitations, risks, security and privacy concerns of performing an evaluation and management service by telephone and the availability of in person appointments. Staff also discussed with the patient that there may be a patient responsible charge related to this service. Patient Location: Home Provider Location: Home Office Additional Individuals present: None  HPI  HTN: She had echo in June 2019; denies vision changes, headaches, chest pain, shortness of breath.  She does have occasional BLE trace edema - not as active recently after shoulder surgery.  Taking amlodipine 5mg  daily and tolerating well.  BP is at goal today; not checking BP's at home.  No changes today.  Obesity: She has gained some weight back after her surgery - is planning to return to low carb diet. Working on activity level as shoulder heals from surgery.  HLD: Denies chest pain, shortness of breath, or myalgias.  Takes her atorvastatin without issues. No changes.   LEFT shoulder pain: She has surgery by Dr. Harlow Mares 09/13/20.  Still in PT after rotator cuff repair.  She feels she is improving with therapy; shoulder is about a 1/10.  Takes tylenol or advil PRN.  Hypothyroid: On the same dose for many years; denies hair/skin/nail changes, cold intolerance or fatigue, or palpitations; always has some heat intolerance.  Last labs were normal; stable for many years now.  Due for labs.  OSA: Does not wear CPAP,  took the machine back because she could not sleep with the machine.  Working on weight loss. Does still snore some at home.  Vitamin D and Vitamin B12 deficiencies: Taking supplementation; due for labs, energy levels have been adequate.  GAD: She separated from her husband and is feeling much better since separating.  She is taking celexa 20mg  (down from the 40mg  dosing) and is feeling very good.  No panic, crying episodes; no SI/HI.  We discussed weaning off as the trigger of her anxiety is no longer an issue, and she is agreeable.  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  Has had a couple of outbreaks since last visit.  Patient Active Problem List   Diagnosis Date Noted  . Mild concentric left ventricular hypertrophy 05/17/2018  . Grade I diastolic dysfunction [id number]  . DDD (degenerative disc disease), lumbar 02/07/2018  . Right lumbar radiculopathy 07/05/2017  . 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  . Hypokalemia 05/04/2017  . Obesity (BMI 30.0-34.9) 01/05/2017  . Cardiomegaly 04/20/2015  . Essential hypertension 04/20/2015  . Long-term use of high-risk medication 11/19/2014  . Cephalalgia 10/02/2014  . Postmenopausal atrophic vaginitis 09/15/2014  . Generalized anxiety disorder 09/15/2014  . B12 deficiency 06/22/2014  . Vitamin D deficiency 06/22/2014  . OSA (obstructive sleep apnea) 06/22/2014  . 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 Hyperlipidemia LDL goal <130 04/09/2008    Past Surgical History:  Procedure Laterality Date  . ABDOMINAL HYSTERECTOMY  1979  . SHOULDER  ARTHROSCOPY WITH ROTATOR CUFF REPAIR Left 09/14/2019   Procedure: SHOULDER ARTHROSCOPY WITH ROTATOR CUFF REPAIR;  Surgeon: Lovell Sheehan, MD;  Location: [facility];  Service: Orthopedics;  Laterality: Left;  Marland Kitchen VAGINAL HYSTERECTOMY  1979    Family History  Problem Relation Age of Onset  . Hypertension Mother   . Alzheimer's disease Mother   . Cancer  Father 57       prostate cancer  . Cancer Sister 27       breast cancer  . Breast cancer Sister 33  . Aneurysm Sister   . Diabetes Brother        type 2  . Heart disease Sister        cabg x 4, tobacco abuse  . Diabetes Sister        type 2  . Lung cancer Nephew     Social History   Socioeconomic History  . Marital status: Married    Spouse name: Rip Harbour  . Number of children: 2  . Years of education: Not on file  . Highest education level: 12th grade  Occupational History  . Occupation: Retired  Tobacco Use  . Smoking status: Never Smoker  . Smokeless tobacco: Never Used  . Tobacco comment: smoking cessation materials not required  Substance and Sexual Activity  . Alcohol use: Yes    Alcohol/week: 1.0 standard drinks    Types: 1 Glasses of wine per week    Comment: rare  . Drug use: No  . Sexual activity: Yes  Other Topics Concern  . Not on file  Social History Narrative  . Not on file   Social Determinants of Health   Financial Resource Strain:   . Difficulty of Paying Living Expenses: Not on file  Food Insecurity:   . Worried About Charity fundraiser in the Last Year: Not on file  . Ran Out of Food in the Last Year: Not on file  Transportation Needs:   . Lack of Transportation (Medical): Not on file  . Lack of Transportation (Non-Medical): Not on file  Physical Activity:   . Days of Exercise per Week: Not on file  . Minutes of Exercise per Session: Not on file  Stress:   . Feeling of Stress : Not on file  Social Connections:   . Frequency of Communication with Friends and Family: Not on file  . Frequency of Social Gatherings with Friends and Family: Not on file  . Attends Religious Services: Not on file  . Active Member of Clubs or Organizations: Not on file  . Attends Archivist Meetings: Not on file  . Marital Status: Not on file  Intimate Partner Violence:   . Fear of Current or Ex-Partner: Not on file  . Emotionally Abused: Not on file    . Physically Abused: Not on file  . Sexually Abused: Not on file     Current Outpatient Medications:  .  acyclovir (ZOVIRAX) 400 MG tablet, TAKE 1 TABLET DAILY, Disp: 90 tablet, Rfl: 3 .  amLODipine (NORVASC) 5 MG tablet, Take 1 tablet (5 mg total) by mouth at bedtime., Disp: 90 tablet, Rfl: 2 .  atorvastatin (LIPITOR) 40 MG tablet, TAKE 1 TABLET AT BEDTIME, Disp: 30 tablet, Rfl: 11 .  citalopram (CELEXA) 20 MG tablet, TAKE 1 TABLET DAILY, Disp: 90 tablet, Rfl: 1 .  docusate sodium (COLACE) 100 MG capsule, Take 1 capsule (100 mg total) by mouth daily as needed., Disp: 30 capsule, Rfl: 2 .  estradiol (ESTRACE VAGINAL) 0.1 MG/GM vaginal cream, Apply 0.5mg  (pea-sized amount)  just inside the vaginal introitus with a finger-tip on Monday, Wednesday and Friday nights., Disp: 30 g, Rfl: 12 .  HYDROcodone-acetaminophen (NORCO/VICODIN) 5-325 MG tablet, Take 1 tablet by mouth every 4 (four) hours as needed for moderate pain., Disp: 20 tablet, Rfl: 0 .  levothyroxine (SYNTHROID) 112 MCG tablet, Take 1 tablet (112 mcg total) by mouth daily before breakfast., Disp: 90 tablet, Rfl: 1 .  meloxicam (MOBIC) 15 MG tablet, Take 15 mg by mouth daily., Disp: , Rfl:  .  vitamin B-12 (CYANOCOBALAMIN) 100 MCG tablet, Take 500 mcg by mouth daily., Disp: , Rfl:   No Known Allergies  I personally reviewed active problem list, medication list, allergies, health maintenance, notes from last encounter, lab results with the patient/caregiver today.   ROS  Constitutional: Negative for fever or weight change.  Respiratory: Negative for cough and shortness of breath.   Cardiovascular: Negative for chest pain or palpitations.  Gastrointestinal: Negative for abdominal pain, no bowel changes.  Musculoskeletal: Negative for gait problem or joint swelling.  Skin: Negative for rash.  Neurological: Negative for dizziness or headache.  No other specific complaints in a complete review of systems (except as listed in HPI  above)  Objective  Virtual encounter, vitals not obtained.  There is no height or weight on file to calculate BMI.  Physical Exam  Pulmonary/Chest: Effort normal. No respiratory distress. Speaking in complete sentences Neurological: Pt is alert and oriented to person, place, and time. Speech is normal Psychiatric: Patient has a normal mood and affect. behavior is normal. Judgment and thought content normal.  No results found for this or any previous visit (from the past 72 hour(s)).  PHQ2/9: Depression screen [facility] 2/9 12/13/2019 08/04/2019 01/09/2019 08/29/2018 08/23/2018  Decreased Interest 0 0 0 0 0  Down, Depressed, Hopeless 0 0 0 0 0  PHQ - 2 Score 0 0 0 0 0  Altered sleeping 0 0 0 0 0  Tired, decreased energy 0 0 0 0 0  Change in appetite 0 0 0 0 0  Feeling bad or failure about yourself  0 0 0 0 0  Trouble concentrating 0 0 0 0 0  Moving slowly or fidgety/restless 0 0 0 0 0  Suicidal thoughts 0 - 0 0 0  PHQ-9 Score 0 0 0 0 0  Difficult doing work/chores Not difficult at all Not difficult at all Not difficult at all Not difficult at all Not difficult at all   PHQ-2/9 Result is negative.    Fall Risk: Fall Risk  12/13/2019 08/04/2019 01/09/2019 08/29/2018 08/23/2018  Falls in the past year? 0 0 0 No No  Comment - - - - -  Number falls in past yr: 0 0 0 - -  Injury with Fall? 0 0 0 - -  Risk for fall due to : - - - - Other (Comment)  Risk for fall due to: Comment - - - - N/A  Follow up Falls evaluation completed Falls evaluation completed - - -    Assessment & Plan  1. Essential hypertension - Continue current regimen - amLODipine (NORVASC) 5 MG tablet; Take 1 tablet (5 mg total) by mouth at bedtime.  Dispense: 90 tablet; Refill: 3 - COMPLETE METABOLIC PANEL WITH GFR  2. Hyperlipidemia LDL goal <130 - due for labs - atorvastatin (LIPITOR) 40 MG tablet; Take 1 tablet (40 mg total) by mouth at bedtime.  Dispense: 90 tablet; Refill: 3 -  Lipid panel  3. Obesity (BMI  30.0-34.9) - Discussed with the patient the risk posed by an increased BMI. Discussed importance of portion control, calorie counting and at least 150 minutes of physical activity weekly. Avoid sweet beverages and drink more water. Eat at least 6 servings of fruit and vegetables daily  - COMPLETE METABOLIC PANEL WITH GFR  4. Hypothyroidism, postradioiodine therapy - levothyroxine (SYNTHROID) 112 MCG tablet; Take 1 tablet (112 mcg total) by mouth daily before breakfast.  Dispense: 90 tablet; Refill: 3 - Has had some hair thinning; sees dermatology, declines additional eval today - TSH  5. OSA (obstructive sleep apnea) - Not wearing CPAP, working on weight loss  6. Vitamin D deficiency - VITAMIN D 25 Hydroxy (Vit-D Deficiency, Fractures)  7. B12 deficiency - B12 and Folate Panel  8. Generalized anxiety disorder - Will try weaning off of Celexa.  - citalopram (CELEXA) 20 MG tablet; Take 1/2 tablet once daily for 4 weeks.  If doing well, may stop medication.  Dispense: 90 tablet; Refill: 0  9. HSV-2 (herpes simplex virus 2) infection - Acyclovir PRN   I discussed the assessment and treatment plan with the patient. The patient was provided an opportunity to ask questions and all were answered. The patient agreed with the plan and demonstrated an understanding of the instructions.   The patient was advised to call back or seek an in-person evaluation if the symptoms worsen or if the condition fails to improve as anticipated.  I provided 24 minutes of non-face-to-face time during this encounter.  Hubbard Hartshorn, FNP

## 2019-12-16 LAB — COMPLETE METABOLIC PANEL WITH GFR
AG Ratio: 1.6 (calc) (ref 1.0–2.5)
ALT: 15 U/L (ref 6–29)
AST: 16 U/L (ref 10–35)
Albumin: 4.1 g/dL (ref 3.6–5.1)
Alkaline phosphatase (APISO): 57 U/L (ref 37–153)
BUN: 13 mg/dL (ref 7–25)
CO2: 27 mmol/L (ref 20–32)
Calcium: 9.5 mg/dL (ref 8.6–10.4)
Chloride: 103 mmol/L (ref 98–110)
Creat: 0.74 mg/dL (ref 0.60–0.93)
GFR, Est African American: 94 mL/min/{1.73_m2} (ref >=60)
GFR, Est Non African American: 81 mL/min/{1.73_m2} (ref >=60)
Globulin: 2.6 g/dL (calc) (ref 1.9–3.7)
Glucose, Bld: 126 mg/dL — ABNORMAL HIGH (ref 65–99)
Potassium: 3.9 mmol/L (ref 3.5–5.3)
Sodium: 139 mmol/L (ref 135–146)
Total Bilirubin: 0.7 mg/dL (ref 0.2–1.2)
Total Protein: 6.7 g/dL (ref 6.1–8.1)

## 2019-12-16 LAB — LIPID PANEL
Cholesterol: 195 mg/dL (ref <200)
HDL: 59 mg/dL (ref >=50)
LDL Cholesterol (Calc): 103 mg/dL (calc) — ABNORMAL HIGH
Non-HDL Cholesterol (Calc): 136 mg/dL (calc) — ABNORMAL HIGH (ref <130)
Total CHOL/HDL Ratio: 3.3 (calc) (ref <5.0)
Triglycerides: 211 mg/dL — ABNORMAL HIGH (ref <150)

## 2019-12-16 LAB — VITAMIN D 25 HYDROXY (VIT D DEFICIENCY, FRACTURES): Vit D, 25-Hydroxy: 20 ng/mL — ABNORMAL LOW (ref 30–100)

## 2019-12-16 LAB — TSH: TSH: 5.18 mIU/L — ABNORMAL HIGH (ref 0.40–4.50)

## 2019-12-16 LAB — B12 AND FOLATE PANEL
Folate: 18.6 ng/mL
Vitamin B-12: 229 pg/mL (ref 200–1100)

## 2019-12-19 ENCOUNTER — Ambulatory Visit: Payer: Medicare HMO | Admitting: Urology

## 2019-12-20 ENCOUNTER — Telehealth: Payer: Self-pay | Admitting: Urology

## 2019-12-20 NOTE — Telephone Encounter (Signed)
Would you schedule Mrs. Byus for her yearly follow up for microscopic blood in the urine?

## 2019-12-20 NOTE — Telephone Encounter (Signed)
I called and spoke with pt and r/s her appt.

## 2020-01-09 ENCOUNTER — Other Ambulatory Visit: Payer: Self-pay

## 2020-01-09 ENCOUNTER — Encounter: Payer: Self-pay | Admitting: Urology

## 2020-01-09 ENCOUNTER — Ambulatory Visit: Payer: Medicare HMO | Admitting: Urology

## 2020-01-09 VITALS — BP 134/82 | HR 69 | Ht 66.0 in | Wt 212.0 lb

## 2020-01-09 DIAGNOSIS — N3941 Urge incontinence: Secondary | ICD-10-CM

## 2020-01-09 DIAGNOSIS — N952 Postmenopausal atrophic vaginitis: Secondary | ICD-10-CM

## 2020-01-09 DIAGNOSIS — R300 Dysuria: Secondary | ICD-10-CM | POA: Diagnosis not present

## 2020-01-09 DIAGNOSIS — R3129 Other microscopic hematuria: Secondary | ICD-10-CM

## 2020-01-09 NOTE — Progress Notes (Signed)
01/09/2020 7:13 PM   Baker Pierini 18-Sep-1946 [id number]  Referring provider: Arnetha Courser, MD [address] [place],  [place] [address]  Chief Complaint  Patient presents with  . Hematuria    HPI: Patient is a 74 -year-old female with a history of hematuria and rUTI's who presents today for follow up.    History of hematuria (high risk) Second hand smoke exposure as a child.  CTU in 09/2018 revealed normal adrenal glands. No renal calculi or hydronephrosis. No hydroureter or ureteric calculi. No bladder calculi. Interpolar left renal too small to characterize lesion of approximately 6 mm is most likely a cyst. No suspicious renal mass on post-contrast imaging. Good renal collecting system opacification on delayed images. Good ureteric opacification, without filling defect.  Mild bladder wall irregularity and pericystic edema, including on image 79/7.  No filling defect within the urinary bladder on well opacified delayed images.  Cysto on 11/01/2018 with Dr. Diamantina Providence was NED.   Today, she does not report any gross hematuria.   Her initial UA was yellow cloudy, trace ketones, specific gravity was greater than 1.030, + Blood, pH 5.5, Protein 1+, Leukocytes +1 11-30 WBCs, 3-10 RBCs, greater than 10 epithelial cells, 0-10 renal epithelial cells, hyaline casts, mucus threads present and many bacteria.   CATH UA was yellow slightly cloudy, specific gravity greater than 0.030, trace blood, pH of 5.5, 0-5 WBCs per high-power field, 3-10 RBCs per high-power field, 0-10 epithelial cells per high-power field and few bacteria.  Urine culture is pending.    rUTI's Risk factors: age, vaginal atrophy and limited fluid intake One documented UTI on Epic over the last year in 07/2019 for pan sensitive E. Coli  Today, she reports intermittent burning and uncomfortableness in the suprapubic and vaginal area.  Patient denies any modifying or aggravating factors.  Patient denies any gross  hematuria, dysuria or flank pain.  Patient denies any fevers, chills, nausea or vomiting.   CATH UA as above.   PVR is 30 mL.    PMH: Past Medical History:  Diagnosis Date  . Arthritis    lower back  . Frequent headaches    stress  related  . Grade I diastolic dysfunction 0000000   Echo June 2019  . High cholesterol   . Hyperlipidemia   . Hypertension   . Hyperthyroidism   . Migraines   . Mild concentric left ventricular hypertrophy 05/17/2018   Echo June 2019  . S/P hysterectomy 09/12/2014   Due to fibroids,   . Thyroid disease     Surgical History: Past Surgical History:  Procedure Laterality Date  . ABDOMINAL HYSTERECTOMY  1979  . SHOULDER ARTHROSCOPY WITH ROTATOR CUFF REPAIR Left 09/14/2019   Procedure: SHOULDER ARTHROSCOPY WITH ROTATOR CUFF REPAIR;  Surgeon: Lovell Sheehan, MD;  Location: [facility];  Service: Orthopedics;  Laterality: Left;  Marland Kitchen VAGINAL HYSTERECTOMY  1979    Home Medications:  Allergies as of 01/09/2020   No Known Allergies     Medication List       Accurate as of January 09, 2020 11:59 PM. If you have any questions, ask your nurse or doctor.        STOP taking these medications   docusate sodium 100 MG capsule Commonly known as: Colace Stopped by: Layza Summa, PA-C   meloxicam 15 MG tablet Commonly known as: MOBIC Stopped by: Haylen Shelnutt, PA-C   vitamin B-12 100 MCG tablet Commonly known as: CYANOCOBALAMIN Stopped by: Larene Beach  Gordon Vandunk, PA-C     TAKE these medications   acyclovir 400 MG tablet Commonly known as: ZOVIRAX TAKE 1 TABLET DAILY   amLODipine 5 MG tablet Commonly known as: NORVASC Take 1 tablet (5 mg total) by mouth at bedtime.   atorvastatin 40 MG tablet Commonly known as: LIPITOR Take 1 tablet (40 mg total) by mouth at bedtime.   citalopram 20 MG tablet Commonly known as: CELEXA Take 1/2 tablet once daily for 4 weeks.  If doing well, may stop medication.   estradiol 0.1 MG/GM vaginal  cream Commonly known as: ESTRACE VAGINAL Apply 0.5mg  (pea-sized amount)  just inside the vaginal introitus with a finger-tip on Monday, Wednesday and Friday nights.   levothyroxine 112 MCG tablet Commonly known as: SYNTHROID Take 1 tablet (112 mcg total) by mouth daily before breakfast.       Allergies: No Known Allergies  Family History: Family History  Problem Relation Age of Onset  . Hypertension Mother   . Alzheimer's disease Mother   . Cancer Father 25       prostate cancer  . Cancer Sister 67       breast cancer  . Breast cancer Sister 66  . Aneurysm Sister   . Diabetes Brother        type 2  . Heart disease Sister        cabg x 4, tobacco abuse  . Diabetes Sister        type 2  . Lung cancer Nephew     Social History:  reports that she has never smoked. She has never used smokeless tobacco. She reports current alcohol use of about 1.0 standard drinks of alcohol per week. She reports that she does not use drugs.  ROS: Pertinent ROS in HPI  Physical Exam: BP 134/82   Pulse 69   Ht 5\' 6"  (1.676 m)   Wt 212 lb (96.2 kg)   BMI 34.22 kg/m   Constitutional:  Well nourished. Alert and oriented, No acute distress. HEENT: [place] AT, mask in place.  Trachea midline, no masses. Cardiovascular: No clubbing, cyanosis, or edema. Respiratory: Normal respiratory effort, no increased work of breathing. GI: Abdomen is soft, non tender, non distended, no abdominal masses.  GU: No CVA tenderness.  No bladder fullness or masses.  Atrophic external genitalia, sparse pubic hair distribution, no lesions.  Normal urethral meatus, no lesions, no prolapse, no discharge.   No urethral masses, tenderness and/or tenderness. No bladder fullness, tenderness or masses. Pale vagina mucosa, poor estrogen effect, no discharge, no lesions, fair pelvic support, grade I cystocele and no rectocele noted.  Anus and perineum are without rashes or lesions.    Skin: No rashes, bruises or suspicious  lesions. Lymph: No inguinal adenopathy. Neurologic: Grossly intact, no focal deficits, moving all 4 extremities. Psychiatric: Normal mood and affect.   Laboratory Data: Urinalysis CATH UA as above  Lab Results  Component Value Date   WBC 8.7 09/01/2019   HGB 13.2 09/01/2019   HCT 38.6 09/01/2019   MCV 90.6 09/01/2019   PLT 228 09/01/2019    Lab Results  Component Value Date   CREATININE 0.74 12/15/2019    Lab Results  Component Value Date   HGBA1C 5.4 09/01/2019    Lab Results  Component Value Date   TSH 5.18 (H) 12/15/2019       Component Value Date/Time   CHOL 195 12/15/2019 1413   HDL 59 12/15/2019 1413   CHOLHDL 3.3 12/15/2019 1413   VLDL  33.0 01/04/2017 1109   LDLCALC 103 (H) 12/15/2019 1413    Lab Results  Component Value Date   AST 16 12/15/2019   Lab Results  Component Value Date   ALT 15 12/15/2019   I have reviewed the labs.  In and Out Catheterization Patient is present today for a I & O catheterization due to rUTI's. Patient was cleaned and prepped in a sterile fashion with betadine . A 14 FR cath was inserted no complications were noted , 30 ml of urine return was noted, urine was yellow in color. A clean urine sample was collected for UA and culture. Bladder was drained  And catheter was removed with out difficulty.    Performed by: Zara Council, PA-C  Assessment & Plan:    1. History of hematuria/microscopic Hematuria work up completed in 10/2018 - findings positive for NED No report of gross hematuria  CATH UA 3-10 RBC's Urine culture is pending  2. Dysuria CTU and cysto were negative Continue the vaginal estrogen cream Urine culture is pending  3. Vaginal atrophy Continue the vaginal estrogen cream on Monday, Wednesday and Friday nights RTC in one year for exam  4. Urge incontinence Managing conservatively   Return for Pending urine culture results.  These notes generated with voice recognition software. I apologize  for typographical errors.  Zara Council, PA-C  [facility] [address] [place]  [place], [place] [address] [phone]

## 2020-01-10 LAB — URINALYSIS, COMPLETE
Bilirubin, UA: NEGATIVE
Glucose, UA: NEGATIVE
Ketones, UA: NEGATIVE
Leukocytes,UA: NEGATIVE
Nitrite, UA: NEGATIVE
Protein,UA: NEGATIVE
Specific Gravity, UA: >1.03 — ABNORMAL HIGH (ref 1.005–1.030)
Urobilinogen, Ur: 0.2 mg/dL (ref 0.2–1.0)
pH, UA: 5.5 (ref 5.0–7.5)

## 2020-01-10 LAB — MICROSCOPIC EXAMINATION

## 2020-01-11 DIAGNOSIS — Z23 Encounter for immunization: Secondary | ICD-10-CM | POA: Diagnosis not present

## 2020-01-13 LAB — CULTURE, URINE COMPREHENSIVE

## 2020-01-15 ENCOUNTER — Telehealth: Payer: Self-pay | Admitting: Family Medicine

## 2020-01-15 MED ORDER — SULFAMETHOXAZOLE-TRIMETHOPRIM 800-160 MG PO TABS: 1.0000 | ORAL_TABLET | Freq: Two times a day (BID) | ORAL | 0 refills | Status: DC

## 2020-01-15 NOTE — Telephone Encounter (Signed)
-----   Message from Nori Riis, PA-C sent at 01/14/2020  6:46 PM EST ----- Please let Julie Perez know that her urine culture was positive for infection.  We need to send in a script for Septra DS, BID x 7 days and then recheck her urine in 3 weeks to see if the microscopic blood has cleared.  It does not need to be a cath specimen.

## 2020-01-15 NOTE — Telephone Encounter (Signed)
Patient notified and voiced understanding. ABX sent to pharmacy and lab appointment scheduled.

## 2020-01-30 ENCOUNTER — Telehealth: Payer: Self-pay | Admitting: Family Medicine

## 2020-01-30 NOTE — Telephone Encounter (Signed)
Patient would like to re-establish with Dr. Derrel Nip. She regrets living Dr. Derrel Nip. She was talk into see another provider. She would like to come back to Dr. Derrel Nip.

## 2020-01-30 NOTE — Telephone Encounter (Signed)
Sorry but I am overbooked

## 2020-01-31 DIAGNOSIS — Z23 Encounter for immunization: Secondary | ICD-10-CM | POA: Diagnosis not present

## 2020-02-01 NOTE — Telephone Encounter (Signed)
Spoke with pt and let her know that Dr. Derrel Nip is overbooked with pt's and not accepting any new pts at this time. Pt gave a verbal understanding.

## 2020-02-14 ENCOUNTER — Other Ambulatory Visit: Payer: Medicare HMO

## 2020-02-14 ENCOUNTER — Other Ambulatory Visit: Payer: Self-pay

## 2020-02-14 DIAGNOSIS — R3129 Other microscopic hematuria: Secondary | ICD-10-CM

## 2020-02-15 LAB — URINALYSIS, COMPLETE
Bilirubin, UA: NEGATIVE
Glucose, UA: NEGATIVE
Ketones, UA: NEGATIVE
Nitrite, UA: NEGATIVE
Protein,UA: NEGATIVE
Specific Gravity, UA: 1.02 (ref 1.005–1.030)
Urobilinogen, Ur: 0.2 mg/dL (ref 0.2–1.0)
pH, UA: 6 (ref 5.0–7.5)

## 2020-02-15 LAB — MICROSCOPIC EXAMINATION

## 2020-04-16 ENCOUNTER — Ambulatory Visit (INDEPENDENT_AMBULATORY_CARE_PROVIDER_SITE_OTHER): Payer: Medicare HMO

## 2020-04-16 ENCOUNTER — Other Ambulatory Visit: Payer: Self-pay

## 2020-04-16 VITALS — BP 128/82 | HR 72 | Temp 97.5°F | Resp 16 | Ht 66.0 in | Wt 223.5 lb

## 2020-04-16 DIAGNOSIS — Z1231 Encounter for screening mammogram for malignant neoplasm of breast: Secondary | ICD-10-CM

## 2020-04-16 DIAGNOSIS — Z0000 Encounter for general adult medical examination without abnormal findings: Secondary | ICD-10-CM | POA: Diagnosis not present

## 2020-04-16 NOTE — Progress Notes (Signed)
Subjective:   Julie Perez is a 74 y.o. female who presents for Medicare Annual (Subsequent) preventive examination.  Review of Systems:   Cardiac Risk Factors include: advanced age (>70men, >99 women);dyslipidemia;hypertension;obesity (BMI >30kg/m2);sedentary lifestyle     Objective:     Vitals: BP 128/82 (BP Location: Right Arm, Patient Position: Sitting, Cuff Size: Normal)   Pulse 72   Temp (!) 97.5 F (36.4 C) (Temporal)   Resp 16   Ht 5\' 6"  (1.676 m)   Wt 223 lb 8 oz (101.4 kg)   SpO2 95%   BMI 36.07 kg/m   Body mass index is 36.07 kg/m.  Advanced Directives 04/16/2020 09/14/2019 08/23/2018 05/20/2017 01/29/2017  Does Patient Have a Medical Advance Directive? Yes Yes No Yes Yes  Type of Paramedic of [place];Living will - Living will;Healthcare Power of [place]  Does patient want to make changes to medical advance directive? - No - Patient declined - No - Patient declined -  Copy of [place] in Chart? Yes - validated most recent copy scanned in chart (See row information) No - copy requested - No - copy requested -  Would patient like information on creating a medical advance directive? - - Yes (MAU/Ambulatory/Procedural Areas - Information given) - -    Tobacco Social History   Tobacco Use  Smoking Status Never Smoker  Smokeless Tobacco Never Used  Tobacco Comment   smoking cessation materials not required     Counseling given: Not Answered Comment: smoking cessation materials not required   Clinical Intake:  Pre-visit preparation completed: Yes  Pain : No/denies pain     BMI - recorded: 36.07 Nutritional Status: BMI > 30  Obese Nutritional Risks: None Diabetes: No  How often do you need to have someone help you when you read instructions, pamphlets, or other written materials from your doctor or pharmacy?: 1 - Never  Interpreter Needed?:  No  Information entered by :: Clemetine Marker LPN  Past Medical History:  Diagnosis Date  . Anxiety   . Arthritis    lower back  . Frequent headaches    stress  related  . Grade I diastolic dysfunction 0000000   Echo June 2019  . High cholesterol   . Hyperlipidemia   . Hypertension   . Hyperthyroidism   . Migraines   . Mild concentric left ventricular hypertrophy 05/17/2018   Echo June 2019  . S/P hysterectomy 09/12/2014   Due to fibroids,   . Thyroid disease    Past Surgical History:  Procedure Laterality Date  . ABDOMINAL HYSTERECTOMY  1979  . SHOULDER ARTHROSCOPY WITH ROTATOR CUFF REPAIR Left 09/14/2019   Procedure: SHOULDER ARTHROSCOPY WITH ROTATOR CUFF REPAIR;  Surgeon: Lovell Sheehan, MD;  Location: [facility];  Service: Orthopedics;  Laterality: Left;  Marland Kitchen VAGINAL HYSTERECTOMY  1979   Family History  Problem Relation Age of Onset  . Hypertension Mother   . Alzheimer's disease Mother   . Cancer Father 71       prostate cancer  . Cancer Sister 70       breast cancer  . Breast cancer Sister 8  . Aneurysm Sister   . Diabetes Brother        type 2  . Heart disease Sister        cabg x 4, tobacco abuse  . Diabetes Sister        type 2  . Lung  cancer Nephew    Social History   Socioeconomic History  . Marital status: Married    Spouse name: Rip Harbour  . Number of children: 2  . Years of education: Not on file  . Highest education level: 12th grade  Occupational History  . Occupation: Retired  Tobacco Use  . Smoking status: Never Smoker  . Smokeless tobacco: Never Used  . Tobacco comment: smoking cessation materials not required  Substance and Sexual Activity  . Alcohol use: Yes    Alcohol/week: 1.0 standard drinks    Types: 1 Glasses of wine per week    Comment: rare  . Drug use: No  . Sexual activity: Yes  Other Topics Concern  . Not on file  Social History Narrative   Pt lives alone. Husband lives out of state.   Social Determinants of  Health   Financial Resource Strain: Low Risk   . Difficulty of Paying Living Expenses: Not hard at all  Food Insecurity: No Food Insecurity  . Worried About Charity fundraiser in the Last Year: Never true  . Ran Out of Food in the Last Year: Never true  Transportation Needs: No Transportation Needs  . Lack of Transportation (Medical): No  . Lack of Transportation (Non-Medical): No  Physical Activity: Inactive  . Days of Exercise per Week: 0 days  . Minutes of Exercise per Session: 0 min  Stress: Stress Concern Present  . Feeling of Stress : To some extent  Social Connections: Unknown  . Frequency of Communication with Friends and Family: Patient refused  . Frequency of Social Gatherings with Friends and Family: Patient refused  . Attends Religious Services: Patient refused  . Active Member of Clubs or Organizations: Patient refused  . Attends Archivist Meetings: Patient refused  . Marital Status: Married    Outpatient Encounter Medications as of 04/16/2020  Medication Sig  . acyclovir (ZOVIRAX) 400 MG tablet TAKE 1 TABLET DAILY  . amLODipine (NORVASC) 5 MG tablet Take 1 tablet (5 mg total) by mouth at bedtime.  Marland Kitchen atorvastatin (LIPITOR) 40 MG tablet Take 1 tablet (40 mg total) by mouth at bedtime.  . citalopram (CELEXA) 20 MG tablet Take 1/2 tablet once daily for 4 weeks.  If doing well, may stop medication.  Marland Kitchen estradiol (ESTRACE VAGINAL) 0.1 MG/GM vaginal cream Apply 0.5mg  (pea-sized amount)  just inside the vaginal introitus with a finger-tip on Monday, Wednesday and Friday nights.  . levothyroxine (SYNTHROID) 112 MCG tablet Take 1 tablet (112 mcg total) by mouth daily before breakfast.  . [DISCONTINUED] sulfamethoxazole-trimethoprim (BACTRIM DS) 800-160 MG tablet Take 1 tablet by mouth every 12 (twelve) hours.   No facility-administered encounter medications on file as of 04/16/2020.    Activities of Daily Living In your present state of health, do you have any  difficulty performing the following activities: 04/16/2020 09/14/2019  Hearing? N N  Comment declines hearing aids -  Vision? N N  Difficulty concentrating or making decisions? N N  Walking or climbing stairs? N N  Dressing or bathing? N N  Doing errands, shopping? N -  Preparing Food and eating ? N -  Using the Toilet? N -  In the past six months, have you accidently leaked urine? N -  Do you have problems with loss of bowel control? N -  Managing your Medications? N -  Managing your Finances? N -  Housekeeping or managing your Housekeeping? N -  Some recent data might be hidden  Patient Care Team: Delsa Grana, PA-C as PCP - General (Family Medicine) Reche Dixon, PA-C as Consulting Physician (Orthopedic Surgery)    Assessment:   This is a routine wellness examination for [facility].  Exercise Activities and Dietary recommendations Current Exercise Habits: The patient does not participate in regular exercise at present, Exercise limited by: None identified  Goals    . Exercise 150 min/wk Moderate Activity     Recommend to exercise for at least 150 minutes per week.    . Increase lean proteins     Low carb foods       Fall Risk Fall Risk  04/16/2020 12/13/2019 08/04/2019 01/09/2019 08/29/2018  Falls in the past year? 0 0 0 0 No  Comment - - - - -  Number falls in past yr: 0 0 0 0 -  Injury with Fall? 0 0 0 0 -  Risk for fall due to : No Fall Risks - - - -  Risk for fall due to: Comment - - - - -  Follow up Falls prevention discussed Falls evaluation completed Falls evaluation completed - -   FALL RISK PREVENTION PERTAINING TO THE HOME:  Any stairs in or around the home? Yes  If so, are there any without handrails? Yes   Home free of loose throw rugs in walkways, pet beds, electrical cords, etc? Yes  Adequate lighting in your home to reduce risk of falls? Yes   ASSISTIVE DEVICES UTILIZED TO PREVENT FALLS:  Life alert? No  Use of a cane, walker or w/c? No  Grab bars in  the bathroom? No  Shower chair or bench in shower? No  Elevated toilet seat or a handicapped toilet? No   DME ORDERS:  DME order needed?  No   TIMED UP AND GO:  Was the test performed? Yes .  Length of time to ambulate 10 feet: 5 sec.   GAIT:  Appearance of gait: Gait steady and fast without the use of an assistive device.   Education: Fall risk prevention has been discussed.  Intervention(s) required? No   DME/home health order needed?  No     Depression Screen PHQ 2/9 Scores 04/16/2020 12/13/2019 08/04/2019 01/09/2019  PHQ - 2 Score 2 0 0 0  PHQ- 9 Score 7 0 0 0     Cognitive Function MMSE - Mini Mental State Exam 05/20/2017  Orientation to time 5  Orientation to Place 5  Registration 3  Attention/ Calculation 5  Recall 3  Language- name 2 objects 2  Language- repeat 1  Language- follow 3 step command 3  Language- read & follow direction 1  Write a sentence 1  Copy design 1  Total score 30     6CIT Screen 04/16/2020 08/23/2018  What Year? 0 points 0 points  What month? 0 points 0 points  What time? 0 points 0 points  Count back from 20 0 points 0 points  Months in reverse 0 points 0 points  Repeat phrase 0 points 0 points  Total Score 0 0    Immunization History  Administered Date(s) Administered  . Influenza, High Dose Seasonal PF 09/08/2017, 08/19/2018, 07/27/2019  . Influenza,inj,Quad PF,6+ Mos 09/12/2014, 07/17/2015  . Influenza-Unspecified 08/23/2016, 08/23/2017  . PFIZER SARS-COV-2 Vaccination 01/11/2020, 01/31/2020  . Pneumococcal Conjugate-13 05/20/2017  . Pneumococcal Polysaccharide-23 07/10/2013  . Tdap 05/20/2016  . Zoster 07/06/2014    Qualifies for Shingles Vaccine? Yes  Zostavax completed 2015. Due for Shingrix. Education has been provided regarding the importance  of this vaccine. Pt has been advised to call insurance company to determine out of pocket expense. Advised may also receive vaccine at local pharmacy or Health Dept. Verbalized  acceptance and understanding.  Tdap: Up to date  Flu Vaccine: Up to date  Pneumococcal Vaccine: Up to date  Covid-19 Vaccine: Up to date  Screening Tests Health Maintenance  Topic Date Due  . MAMMOGRAM  06/16/2019  . INFLUENZA VACCINE  06/23/2020  . COLONOSCOPY  02/22/2023  . TETANUS/TDAP  05/20/2026  . DEXA SCAN  Completed  . COVID-19 Vaccine  Completed  . Hepatitis C Screening  Completed  . PNA vac Low Risk Adult  Completed    Cancer Screenings:  Colorectal Screening: Completed 02/21/13. Repeat every 10 years;   Mammogram: Completed 06/15/18. Repeat every year. Ordered today. Pt provided with contact information and advised to call to schedule appt.   Bone Density: Completed 02/16/17. Results reflect NORMAL. Repeat every 2 years. Pt declines repeat screening at this time.   Lung Cancer Screening: (Low Dose CT Chest recommended if Age 13-80 years, 30 pack-year currently smoking OR have quit w/in 15years.) does not qualify.   Additional Screening:  Hepatitis C Screening: does qualify; Completed 05/20/16  Vision Screening: Recommended annual ophthalmology exams for early detection of glaucoma and other disorders of the eye. Is the patient up to date with their annual eye exam?  Yes  Who is the provider or what is the name of the office in which the pt attends annual eye exams? Dr. Gwynn Burly  Dental Screening: Recommended annual dental exams for proper oral hygiene  Community Resource Referral:  CRR required this visit?  No      Plan:     I have personally reviewed and addressed the Medicare Annual Wellness questionnaire and have noted the following in the patient's chart:  A. Medical and social history B. Use of alcohol, tobacco or illicit drugs  C. Current medications and supplements D. Functional ability and status E.  Nutritional status F.  Physical activity G. Advance directives H. List of other physicians I.  Hospitalizations, surgeries, and ER visits in  previous 12 months J.  [place] such as hearing and vision if needed, cognitive and depression L. Referrals and appointments   In addition, I have reviewed and discussed with patient certain preventive protocols, quality metrics, and best practice recommendations. A written personalized care plan for preventive services as well as general preventive health recommendations were provided to patient.   Signed,  Clemetine Marker, LPN Nurse Health Advisor   Nurse Notes: pt concerned about 11 lb weight gain since Feb 2021. Pt also reports continuing to take citalopram which is helping. Pt advised due for 6 month follow up in July and scheduled appt with Delsa Grana PAC for 06/11/20. Pt did not have TSH rechecked after being elevated on 12/15/19. Pt also c/o fatigue and is NOT taking OTC vitamin D for low vit d levels. Pt to discuss labs at next visit.

## 2020-04-16 NOTE — Patient Instructions (Signed)
Julie Perez , Thank you for taking time to come for your Medicare Wellness Visit. I appreciate your ongoing commitment to your health goals. Please review the following plan we discussed and let me know if I can assist you in the future.   Screening recommendations/referrals: Colonoscopy: done 02/21/13 Mammogram: done 06/15/18. Please call [phone] to schedule your mammogram.  Bone Density: done 02/16/17 Recommended yearly ophthalmology/optometry visit for glaucoma screening and checkup Recommended yearly dental visit for hygiene and checkup  Vaccinations: Influenza vaccine: done 07/27/19 Pneumococcal vaccine: done 05/20/17 Tdap vaccine: done 05/20/16 Shingles vaccine: Shingrix discussed. Please contact your pharmacy for coverage information.  Covid-19: done 01/11/20 & 01/31/20  Conditions/risks identified: Recommend increasing physical activity   Next appointment: Please follow up in one year for your Medicare Annual Wellness visit.     Preventive Care 73 Years and Older, Female Preventive care refers to lifestyle choices and visits with your health care provider that can promote health and wellness. What does preventive care include?  A yearly physical exam. This is also called an annual well check.  Dental exams once or twice a year.  Routine eye exams. Ask your health care provider how often you should have your eyes checked.  Personal lifestyle choices, including:  Daily care of your teeth and gums.  Regular physical activity.  Eating a healthy diet.  Avoiding tobacco and drug use.  Limiting alcohol use.  Practicing safe sex.  Taking low-dose aspirin every day.  Taking vitamin and mineral supplements as recommended by your health care provider. What happens during an annual well check? The services and screenings done by your health care provider during your annual well check will depend on your age, overall health, lifestyle risk factors, and family history of  disease. Counseling  Your health care provider may ask you questions about your:  Alcohol use.  Tobacco use.  Drug use.  Emotional well-being.  Home and relationship well-being.  Sexual activity.  Eating habits.  History of falls.  Memory and ability to understand (cognition).  Work and work Statistician.  Reproductive health. Screening  You may have the following tests or measurements:  Height, weight, and BMI.  Blood pressure.  Lipid and cholesterol levels. These may be checked every 5 years, or more frequently if you are over 6 years old.  Skin check.  Lung cancer screening. You may have this screening every year starting at age 91 if you have a 30-pack-year history of smoking and currently smoke or have quit within the past 15 years.  Fecal occult blood test (FOBT) of the stool. You may have this test every year starting at age 51.  Flexible sigmoidoscopy or colonoscopy. You may have a sigmoidoscopy every 5 years or a colonoscopy every 10 years starting at age 61.  Hepatitis C blood test.  Hepatitis B blood test.  Sexually transmitted disease (STD) testing.  Diabetes screening. This is done by checking your blood sugar (glucose) after you have not eaten for a while (fasting). You may have this done every 1-3 years.  Bone density scan. This is done to screen for osteoporosis. You may have this done starting at age 21.  Mammogram. This may be done every 1-2 years. Talk to your health care provider about how often you should have regular mammograms. Talk with your health care provider about your test results, treatment options, and if necessary, the need for more tests. Vaccines  Your health care provider may recommend certain vaccines, such as:  Influenza vaccine. This  is recommended every year.  Tetanus, diphtheria, and acellular pertussis (Tdap, Td) vaccine. You may need a Td booster every 10 years.  Zoster vaccine. You may need this after age  73.  Pneumococcal 13-valent conjugate (PCV13) vaccine. One dose is recommended after age 49.  Pneumococcal polysaccharide (PPSV23) vaccine. One dose is recommended after age 21. Talk to your health care provider about which screenings and vaccines you need and how often you need them. This information is not intended to replace advice given to you by your health care provider. Make sure you discuss any questions you have with your health care provider. Document Released: 12/06/2015 Document Revised: 07/29/2016 Document Reviewed: 09/10/2015 Elsevier Interactive Patient Education  2017 [place] Prevention in the Home Falls can cause injuries. They can happen to people of all ages. There are many things you can do to make your home safe and to help prevent falls. What can I do on the outside of my home?  Regularly fix the edges of walkways and driveways and fix any cracks.  Remove anything that might make you trip as you walk through a door, such as a raised step or threshold.  Trim any bushes or trees on the path to your home.  Use bright outdoor lighting.  Clear any walking paths of anything that might make someone trip, such as rocks or tools.  Regularly check to see if handrails are loose or broken. Make sure that both sides of any steps have handrails.  Any raised decks and porches should have guardrails on the edges.  Have any leaves, snow, or ice cleared regularly.  Use sand or salt on walking paths during winter.  Clean up any spills in your garage right away. This includes oil or grease spills. What can I do in the bathroom?  Use night lights.  Install grab bars by the toilet and in the tub and shower. Do not use towel bars as grab bars.  Use non-skid mats or decals in the tub or shower.  If you need to sit down in the shower, use a plastic, non-slip stool.  Keep the floor dry. Clean up any water that spills on the floor as soon as it happens.  Remove  soap buildup in the tub or shower regularly.  Attach bath mats securely with double-sided non-slip rug tape.  Do not have throw rugs and other things on the floor that can make you trip. What can I do in the bedroom?  Use night lights.  Make sure that you have a light by your bed that is easy to reach.  Do not use any sheets or blankets that are too big for your bed. They should not hang down onto the floor.  Have a firm chair that has side arms. You can use this for support while you get dressed.  Do not have throw rugs and other things on the floor that can make you trip. What can I do in the kitchen?  Clean up any spills right away.  Avoid walking on wet floors.  Keep items that you use a lot in easy-to-reach places.  If you need to reach something above you, use a strong step stool that has a grab bar.  Keep electrical cords out of the way.  Do not use floor polish or wax that makes floors slippery. If you must use wax, use non-skid floor wax.  Do not have throw rugs and other things on the floor that can make you  trip. What can I do with my stairs?  Do not leave any items on the stairs.  Make sure that there are handrails on both sides of the stairs and use them. Fix handrails that are broken or loose. Make sure that handrails are as long as the stairways.  Check any carpeting to make sure that it is firmly attached to the stairs. Fix any carpet that is loose or worn.  Avoid having throw rugs at the top or bottom of the stairs. If you do have throw rugs, attach them to the floor with carpet tape.  Make sure that you have a light switch at the top of the stairs and the bottom of the stairs. If you do not have them, ask someone to add them for you. What else can I do to help prevent falls?  Wear shoes that:  Do not have high heels.  Have rubber bottoms.  Are comfortable and fit you well.  Are closed at the toe. Do not wear sandals.  If you use a  stepladder:  Make sure that it is fully opened. Do not climb a closed stepladder.  Make sure that both sides of the stepladder are locked into place.  Ask someone to hold it for you, if possible.  Clearly mark and make sure that you can see:  Any grab bars or handrails.  First and last steps.  Where the edge of each step is.  Use tools that help you move around (mobility aids) if they are needed. These include:  Canes.  Walkers.  Scooters.  Crutches.  Turn on the lights when you go into a dark area. Replace any light bulbs as soon as they burn out.  Set up your furniture so you have a clear path. Avoid moving your furniture around.  If any of your floors are uneven, fix them.  If there are any pets around you, be aware of where they are.  Review your medicines with your doctor. Some medicines can make you feel dizzy. This can increase your chance of falling. Ask your doctor what other things that you can do to help prevent falls. This information is not intended to replace advice given to you by your health care provider. Make sure you discuss any questions you have with your health care provider. Document Released: 09/05/2009 Document Revised: 04/16/2016 Document Reviewed: 12/14/2014 Elsevier Interactive Patient Education  2017 [employer].

## 2020-04-23 ENCOUNTER — Other Ambulatory Visit: Payer: Self-pay | Admitting: Urology

## 2020-04-23 DIAGNOSIS — R3129 Other microscopic hematuria: Secondary | ICD-10-CM

## 2020-04-23 NOTE — Progress Notes (Signed)
Referral for nephrology is placed.

## 2020-05-24 ENCOUNTER — Other Ambulatory Visit: Payer: Self-pay

## 2020-05-24 DIAGNOSIS — E890 Postprocedural hypothyroidism: Secondary | ICD-10-CM

## 2020-05-24 MED ORDER — LEVOTHYROXINE SODIUM 112 MCG PO TABS: 112.0000 ug | ORAL_TABLET | Freq: Every day | ORAL | 3 refills | Status: DC

## 2020-06-03 ENCOUNTER — Telehealth: Payer: Self-pay | Admitting: Urology

## 2020-06-03 NOTE — Telephone Encounter (Signed)
Please let Julie Perez know that I have reviewed her notes from Dr. Holley Raring (nephrology) and they recommended to continue follow up with [place], so I would like to see her in December for her history of hematuria.

## 2020-06-04 NOTE — Telephone Encounter (Signed)
Notified patient of Shannon's note below. Appointment made. Patient aware.

## 2020-06-11 ENCOUNTER — Ambulatory Visit: Payer: Medicare HMO | Admitting: Family Medicine

## 2020-06-11 ENCOUNTER — Encounter: Payer: Self-pay | Admitting: Family Medicine

## 2020-06-11 ENCOUNTER — Other Ambulatory Visit: Payer: Self-pay

## 2020-06-11 ENCOUNTER — Telehealth: Payer: Self-pay | Admitting: Family Medicine

## 2020-06-11 VITALS — BP 124/78 | HR 69 | Temp 97.8°F | Resp 16 | Ht 66.0 in | Wt 225.4 lb

## 2020-06-11 DIAGNOSIS — I10 Essential (primary) hypertension: Secondary | ICD-10-CM | POA: Diagnosis not present

## 2020-06-11 DIAGNOSIS — E785 Hyperlipidemia, unspecified: Secondary | ICD-10-CM

## 2020-06-11 DIAGNOSIS — E559 Vitamin D deficiency, unspecified: Secondary | ICD-10-CM

## 2020-06-11 DIAGNOSIS — E890 Postprocedural hypothyroidism: Secondary | ICD-10-CM | POA: Diagnosis not present

## 2020-06-11 DIAGNOSIS — R7301 Impaired fasting glucose: Secondary | ICD-10-CM

## 2020-06-11 DIAGNOSIS — Z5181 Encounter for therapeutic drug level monitoring: Secondary | ICD-10-CM

## 2020-06-11 DIAGNOSIS — E538 Deficiency of other specified B group vitamins: Secondary | ICD-10-CM

## 2020-06-11 NOTE — Telephone Encounter (Signed)
Patient saw Dr. Derrel Nip in 12/2017 and then went to another provider. Patient states she was talked into trying a new provider. She would like to know it Dr. Derrel Nip will take her back. Patietn has been advised that Dr. Derrel Nip is not taking on new patients right now.

## 2020-06-11 NOTE — Progress Notes (Signed)
Name: Julie Perez   MRN: [id number]    DOB: 1946/03/03   Date:06/11/2020       Progress Note  Chief Complaint  Patient presents with  . Follow-up  . Hypothyroidism  . Hyperlipidemia  . Hypertension     Subjective:   Julie Perez is a 74 y.o. female, presents to clinic for hypothryoid, HTN, HLD  Last OV was with her PCP who recently moved out of the area, pt new to me Last OV thyroid labs were abnormal - meds not changed, pcp wanted to recheck in 8 weeks Blood sugar was high but A1C was not able to be added on- pt notes she ate a big breakfast at [employer] that morning, would like to recheck labs, is fasting today, prior to ordering more labs/A1C  Cholesterol was not well controlled - Julie Perez had advised diet changes, pt is on moderate intensity statin - lipitor 40 mg, not change to diet Reviewed lipid panel going back years in chart - pt notes she has been on prior to moving here and coming to this clinic. No myalgias or SE   Vit D was low - she was encouraged to increase supplement daily dose to 5000 IU  B12 was low - PCP advised starting in office B12 injections after failing PO supplementation  She forgets to take both supplements, not interested in rechecking yet   Hypothyroid: Lab Results  Component Value Date   TSH 5.18 (H) 12/15/2019   Tired - gained a little weight with surgery last OCT for her left shoulder/rotator cuff.     Current Outpatient Medications:  .  amLODipine (NORVASC) 5 MG tablet, Take 1 tablet (5 mg total) by mouth at bedtime., Disp: 90 tablet, Rfl: 3 .  atorvastatin (LIPITOR) 40 MG tablet, Take 1 tablet (40 mg total) by mouth at bedtime., Disp: 90 tablet, Rfl: 3 .  citalopram (CELEXA) 20 MG tablet, Take 1/2 tablet once daily for 4 weeks.  If doing well, may stop medication., Disp: 90 tablet, Rfl: 0 .  estradiol (ESTRACE VAGINAL) 0.1 MG/GM vaginal cream, Apply 0.5mg  (pea-sized amount)  just inside the vaginal introitus with a finger-tip on Monday,  Wednesday and Friday nights., Disp: 30 g, Rfl: 12 .  levothyroxine (SYNTHROID) 112 MCG tablet, Take 1 tablet (112 mcg total) by mouth daily before breakfast., Disp: 90 tablet, Rfl: 3 .  acyclovir (ZOVIRAX) 400 MG tablet, TAKE 1 TABLET DAILY (Patient not taking: Reported on 06/11/2020), Disp: 90 tablet, Rfl: 3  Patient Active Problem List   Diagnosis Date Noted  . Mild concentric left ventricular hypertrophy 05/17/2018  . Grade I diastolic dysfunction 40/98/1191  . DDD (degenerative disc disease), lumbar 02/07/2018  . Right lumbar radiculopathy 07/05/2017  . 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  . Hypokalemia 05/04/2017  . Obesity (BMI 30.0-34.9) 01/05/2017  . Cardiomegaly 04/20/2015  . Essential hypertension 04/20/2015  . Long-term use of high-risk medication 11/19/2014  . Cephalalgia 10/02/2014  . Postmenopausal atrophic vaginitis 09/15/2014  . Generalized anxiety disorder 09/15/2014  . B12 deficiency 06/22/2014  . Vitamin D deficiency 06/22/2014  . OSA (obstructive sleep apnea) 06/22/2014  . 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 Hyperlipidemia LDL goal <130 04/09/2008    Past Surgical History:  Procedure Laterality Date  . ABDOMINAL HYSTERECTOMY  1979  . SHOULDER ARTHROSCOPY WITH ROTATOR CUFF REPAIR Left 09/14/2019   Procedure: SHOULDER ARTHROSCOPY WITH ROTATOR CUFF REPAIR;  Surgeon: Lovell Sheehan, MD;  Location: [facility];  Service: Orthopedics;  Laterality: Left;  Marland Kitchen VAGINAL HYSTERECTOMY  1979    Family History  Problem Relation Age of Onset  . Hypertension Mother   . Alzheimer's disease Mother   . Cancer Father 59       prostate cancer  . Cancer Sister 78       breast cancer  . Breast cancer Sister 26  . Aneurysm Sister   . Diabetes Brother        type 2  . Heart disease Sister        cabg x 4, tobacco abuse  . Diabetes Sister        type 2  . Lung cancer Nephew     Social History   Tobacco Use  . Smoking status:  Never Smoker  . Smokeless tobacco: Never Used  . Tobacco comment: smoking cessation materials not required  Vaping Use  . Vaping Use: Never used  Substance Use Topics  . Alcohol use: Yes    Alcohol/week: 1.0 standard drink    Types: 1 Glasses of wine per week    Comment: rare  . Drug use: No     No Known Allergies  Health Maintenance  Topic Date Due  . MAMMOGRAM  06/16/2019  . INFLUENZA VACCINE  06/23/2020  . COLONOSCOPY  02/22/2023  . TETANUS/TDAP  05/20/2026  . DEXA SCAN  Completed  . COVID-19 Vaccine  Completed  . Hepatitis C Screening  Completed  . PNA vac Low Risk Adult  Completed    Chart Review Today: I personally reviewed active problem list, medication list, allergies, family history, social history, health maintenance, notes from last encounter, lab results, imaging with the patient/caregiver today.   Review of Systems  10 Systems reviewed and are negative for acute change except as noted in the HPI.  Objective:   Vitals:   06/11/20 1046  BP: 124/78  Pulse: 69  Resp: 16  Temp: 97.8 F (36.6 C)  TempSrc: Temporal  SpO2: 97%  Weight: 225 lb 6.4 oz (102.2 kg)  Height: 5\' 6"  (1.676 m)    Body mass index is 36.38 kg/m.  Physical Exam Vitals and nursing note reviewed.  Constitutional:      General: She is not in acute distress.    Appearance: Normal appearance. She is well-developed. She is not ill-appearing, toxic-appearing or diaphoretic.     Interventions: Face mask in place.  HENT:     Head: Normocephalic and atraumatic.     Right Ear: External ear normal.     Left Ear: External ear normal.  Eyes:     General: Lids are normal. No scleral icterus.       Right eye: No discharge.        Left eye: No discharge.     Conjunctiva/sclera: Conjunctivae normal.  Neck:     Trachea: Phonation normal. No tracheal deviation.  Cardiovascular:     Rate and Rhythm: Normal rate and regular rhythm.     Pulses: Normal pulses.          Radial pulses are 2+  on the right side and 2+ on the left side.       Posterior tibial pulses are 2+ on the right side and 2+ on the left side.     Heart sounds: Normal heart sounds. No murmur heard.  No friction rub. No gallop.   Pulmonary:     Effort: Pulmonary effort is normal. No respiratory distress.     Breath sounds: Normal breath sounds. No stridor. No  wheezing, rhonchi or rales.  Chest:     Chest wall: No tenderness.  Abdominal:     General: Bowel sounds are normal. There is no distension.     Palpations: Abdomen is soft.     Tenderness: There is no abdominal tenderness. There is no guarding or rebound.  Musculoskeletal:     Left shoulder: Decreased range of motion.     Cervical back: Normal range of motion and neck supple.     Right lower leg: No edema.     Left lower leg: No edema.  Lymphadenopathy:     Cervical: No cervical adenopathy.  Skin:    General: Skin is warm and dry.     Coloration: Skin is not jaundiced or pale.     Findings: No rash.  Neurological:     Mental Status: She is alert.     Motor: No abnormal muscle tone.     Gait: Gait normal.  Psychiatric:        Mood and Affect: Mood normal.        Speech: Speech normal.        Behavior: Behavior normal.         Assessment & Plan:     ICD-10-CM   1. Essential hypertension  D17 COMPLETE METABOLIC PANEL WITH GFR   stable, well controlled  2. Hyperlipidemia LDL goal <130  O16.0 COMPLETE METABOLIC PANEL WITH GFR   LDL borderline - on lipitor 40 mg dose for many years, encouraged her to work on diet/exercise and other heart healthy lifestyle efforts  3. Hypothyroidism, postradioiodine therapy  E89.0 TSH   overall pt feels tired, last TSH was high, discussed labs and med changes - if TSH still high increase dose to 125 mcg daily and f/up 6-8 weeks  4. Medication monitoring encounter  Z51.81 TSH    CBC with Differential/Platelet    COMPLETE METABOLIC PANEL WITH GFR  5. B12 deficiency  E53.8 CBC with Differential/Platelet    not compliant with supplementation - encouraged her to work on supplementing, she has diarrhea often, consider malabsorption? B12 injections offered  6. Vitamin D deficiency  E55.9    low Vit D, encouraged her to work on better daily supplementation  7. Impaired fasting glucose  V37.10 COMPLETE METABOLIC PANEL WITH GFR   last labs random sugar high, no hx of DM, recheck labs today, pt did fast, add on A1C if sugar high, likely no DM if glucose normal     Return in about 6 months (around 12/12/2020) for Routine follow-up.   Delsa Grana, PA-C 06/11/20 11:00 AM

## 2020-06-11 NOTE — Telephone Encounter (Signed)
Pt was advised back in March/April that you were overbooked and not accepting any new pts at this time.

## 2020-06-11 NOTE — Patient Instructions (Addendum)
Tylenol 650 - 1000 mg up to three times a day   Aleve or naproxen twice a day dosing - watch for stomach upset or acid reflux    Hi Ms. Faley, Your Cholesterol panel shows increase in your triglycerides, and no change to your LDL; HDL is still in healthy range. I want your LDL to be under 100, and you are so close to this goal. No medicaiton changes right now, just really focus on diet and exercise. Some strategies to focus on to help improve your LDL levels:  - Eat 20 to 30 grams of fiber every day.  - Eat Foods such as fruits and vegetables, whole grains, beans, peas, nuts, and seeds can help lower LDL. - Avoid Saturated fats - Dairy foods - such as butter, cream, ghee, regular-fat milk and cheese. Meat - such as fatty cuts of beef, pork and lamb, processed meats like salami, sausages and the skin on chicken. Lard., fatty snack foods, cakes, biscuits, pies and deep fried foods) - Avoid smoking  Your thyroid level is just a bit above normal - we can recheck in 8 weeks, if still not back to normal, will adjust your dose.  Vitamin D is low - take 5000IU OTC once daily for 90 days, then switch to 1000IU once daily.  Vitamin B12 is still low, despite taking oral supplement - I recommend monthly B12 injections if you are interested (just call our office to schedule). If not interested, please purchase the sublingual disolvable tablets rather than the kind that you swallow as these are better absorbed.  Your sugar was elevated, CMP otherwise normal. Kidney function, electrolytes, and liver function tests are within normal limits. We will add on A1C to check for prediabetes/diabetes.  Thanks! Raelyn Ensign NP-C  The 10-year ASCVD risk score Mikey Bussing DC Brooke Bonito., et al., 2013) is: 11.5% Values used to calculate the score:  Age: 74 years  Sex: Female  Is Non-Hispanic African American: No  Diabetic: No  Tobacco smoker: No  Systolic Blood Pressure: 376 mmHg  Is BP  treated: Yes  HDL Cholesterol: 59 mg/dL  Total Cholesterol: 195 mg/dL

## 2020-06-12 ENCOUNTER — Other Ambulatory Visit: Payer: Self-pay | Admitting: Family Medicine

## 2020-06-12 DIAGNOSIS — Z5181 Encounter for therapeutic drug level monitoring: Secondary | ICD-10-CM

## 2020-06-12 DIAGNOSIS — E890 Postprocedural hypothyroidism: Secondary | ICD-10-CM

## 2020-06-12 LAB — CBC WITH DIFFERENTIAL/PLATELET
Absolute Monocytes: 428 cells/uL (ref 200–950)
Basophils Absolute: 19 cells/uL (ref 0–200)
Basophils Relative: 0.3 %
Eosinophils Absolute: 118 cells/uL (ref 15–500)
Eosinophils Relative: 1.9 %
HCT: 42 % (ref 35.0–45.0)
Hemoglobin: 14.2 g/dL (ref 11.7–15.5)
Lymphs Abs: 1761 cells/uL (ref 850–3900)
MCH: 30.1 pg (ref 27.0–33.0)
MCHC: 33.8 g/dL (ref 32.0–36.0)
MCV: 89 fL (ref 80.0–100.0)
MPV: 11.3 fL (ref 7.5–12.5)
Monocytes Relative: 6.9 %
Neutro Abs: 3875 cells/uL (ref 1500–7800)
Neutrophils Relative %: 62.5 %
Platelets: 215 10*3/uL (ref 140–400)
RBC: 4.72 10*6/uL (ref 3.80–5.10)
RDW: 13.3 % (ref 11.0–15.0)
Total Lymphocyte: 28.4 %
WBC: 6.2 10*3/uL (ref 3.8–10.8)

## 2020-06-12 LAB — COMPLETE METABOLIC PANEL WITH GFR
AG Ratio: 1.7 (calc) (ref 1.0–2.5)
ALT: 18 U/L (ref 6–29)
AST: 17 U/L (ref 10–35)
Albumin: 4.3 g/dL (ref 3.6–5.1)
Alkaline phosphatase (APISO): 73 U/L (ref 37–153)
BUN: 14 mg/dL (ref 7–25)
CO2: 27 mmol/L (ref 20–32)
Calcium: 9 mg/dL (ref 8.6–10.4)
Chloride: 104 mmol/L (ref 98–110)
Creat: 0.75 mg/dL (ref 0.60–0.93)
GFR, Est African American: 92 mL/min/{1.73_m2} (ref >=60)
GFR, Est Non African American: 79 mL/min/{1.73_m2} (ref >=60)
Globulin: 2.6 g/dL (calc) (ref 1.9–3.7)
Glucose, Bld: 97 mg/dL (ref 65–99)
Potassium: 4.1 mmol/L (ref 3.5–5.3)
Sodium: 141 mmol/L (ref 135–146)
Total Bilirubin: 0.9 mg/dL (ref 0.2–1.2)
Total Protein: 6.9 g/dL (ref 6.1–8.1)

## 2020-06-12 LAB — TSH: TSH: 5.6 mIU/L — ABNORMAL HIGH (ref 0.40–4.50)

## 2020-06-12 MED ORDER — LEVOTHYROXINE SODIUM 25 MCG PO TABS: ORAL_TABLET | ORAL | 0 refills | Status: DC

## 2020-06-12 NOTE — Telephone Encounter (Signed)
Will you schedule pt for a new pt appt.

## 2020-06-12 NOTE — Telephone Encounter (Signed)
Yes, I will take her back

## 2020-06-13 NOTE — Telephone Encounter (Signed)
Patient will call back to make an appointment. She is re-establishing care with Dr. Veneta Penton

## 2020-07-12 ENCOUNTER — Ambulatory Visit: Payer: Medicare HMO | Admitting: Internal Medicine

## 2020-07-12 ENCOUNTER — Other Ambulatory Visit: Payer: Self-pay

## 2020-07-12 ENCOUNTER — Encounter: Payer: Self-pay | Admitting: Internal Medicine

## 2020-07-12 VITALS — BP 120/72 | HR 72 | Temp 98.3°F | Resp 15 | Ht 66.0 in | Wt 226.0 lb

## 2020-07-12 DIAGNOSIS — I10 Essential (primary) hypertension: Secondary | ICD-10-CM

## 2020-07-12 DIAGNOSIS — E669 Obesity, unspecified: Secondary | ICD-10-CM

## 2020-07-12 DIAGNOSIS — E039 Hypothyroidism, unspecified: Secondary | ICD-10-CM | POA: Diagnosis not present

## 2020-07-12 DIAGNOSIS — Z23 Encounter for immunization: Secondary | ICD-10-CM

## 2020-07-12 DIAGNOSIS — R159 Full incontinence of feces: Secondary | ICD-10-CM | POA: Diagnosis not present

## 2020-07-12 DIAGNOSIS — R3129 Other microscopic hematuria: Secondary | ICD-10-CM | POA: Diagnosis not present

## 2020-07-12 DIAGNOSIS — G4733 Obstructive sleep apnea (adult) (pediatric): Secondary | ICD-10-CM

## 2020-07-12 DIAGNOSIS — R152 Fecal urgency: Secondary | ICD-10-CM

## 2020-07-12 LAB — URINALYSIS, ROUTINE W REFLEX MICROSCOPIC
Bilirubin Urine: NEGATIVE
Ketones, ur: NEGATIVE
Nitrite: NEGATIVE
Specific Gravity, Urine: 1.02 (ref 1.000–1.030)
Total Protein, Urine: NEGATIVE
Urine Glucose: NEGATIVE
Urobilinogen, UA: 1 (ref 0.0–1.0)
pH: 6.5 (ref 5.0–8.0)

## 2020-07-12 LAB — TSH: TSH: 4.03 u[IU]/mL (ref 0.35–4.50)

## 2020-07-12 MED ORDER — ZOSTER VAC RECOMB ADJUVANTED 50 MCG/0.5ML IM SUSR: 0.5000 mL | Freq: Once | INTRAMUSCULAR | 0 refills | Status: AC

## 2020-07-12 NOTE — Progress Notes (Signed)
Subjective:  Patient ID: Julie Perez, female    DOB: 08/16/1946  Age: 74 y.o. MRN: [id number]  CC: The primary encounter diagnosis was Microscopic hematuria. Diagnoses of Acquired hypothyroidism, Incontinence of feces with fecal urgency, Need for 23-polyvalent pneumococcal polysaccharide vaccine, Obesity (BMI 30.0-34.9), Essential hypertension, and OSA (obstructive sleep apnea) were also pertinent to this visit.  HPI Julie Perez presents for re establishment of care   Obesity:  She has had a weight gain  Of 10 lbs  After shoulder  surgery   Rotator cuff surgery Oct 2020 by Harlow Mares ; had activity restriction until  March . Has full ROM If non weight bearing .  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    Arthritis  Managed with advil 600 mg daily   Hematuria noted  By former PCP;  sent to specialist by Zara Council.  No work up needed  Untreated sleep apnea,  Nocturia  Fecal urgency and incontinence ,  Chronic for years  Post prandial   Taking 112 mcg daily for thyroid   Estranged from 2nd husband by her choice. Living alone.  Son nearby in [place]. financially supportive     Outpatient Medications Prior to Visit  Medication Sig Dispense Refill  . acyclovir (ZOVIRAX) 400 MG tablet TAKE 1 TABLET DAILY (Patient taking differently: Take 400 mg by mouth as needed. ) 90 tablet 3  . amLODipine (NORVASC) 5 MG tablet Take 1 tablet (5 mg total) by mouth at bedtime. 90 tablet 3  . atorvastatin (LIPITOR) 40 MG tablet Take 1 tablet (40 mg total) by mouth at bedtime. 90 tablet 3  . citalopram (CELEXA) 20 MG tablet Take 1/2 tablet once daily for 4 weeks.  If doing well, may stop medication. (Patient taking differently: Take 20 mg by mouth daily. ) 90 tablet 0  . estradiol (ESTRACE VAGINAL) 0.1 MG/GM vaginal cream Apply 0.5mg  (pea-sized amount)  just inside the vaginal introitus with a finger-tip on Monday, Wednesday and Friday nights. 30 g 12  . levothyroxine (SYNTHROID) 112 MCG  tablet Take 1 tablet (112 mcg total) by mouth daily before breakfast. 90 tablet 3  . levothyroxine (SYNTHROID) 25 MCG tablet Take 25 mcg tablet with 112 mcg tablet (total 137 mcg) daily in the morning before breakfast (Patient not taking: Reported on 07/12/2020) 90 tablet 0   No facility-administered medications prior to visit.    Review of Systems;  Patient denies headache, fevers, malaise, unintentional weight loss, skin rash, eye pain, sinus congestion and sinus pain, sore throat, dysphagia,  hemoptysis , cough, dyspnea, wheezing, chest pain, palpitations, orthopnea, edema, abdominal pain, nausea, melena, diarrhea, constipation, flank pain, dysuria, hematuria, urinary  Frequency, nocturia, numbness, tingling, seizures,  Focal weakness, Loss of consciousness,  Tremor, insomnia, depression, anxiety, and suicidal ideation.      Objective:  BP 120/72 (BP Location: Left Arm, Patient Position: Sitting, Cuff Size: Large)   Pulse 72   Temp 98.3 F (36.8 C) (Oral)   Resp 15   Ht 5\' 6"  (1.676 m)   Wt 226 lb (102.5 kg)   SpO2 98%   BMI 36.48 kg/m   BP Readings from Last 3 Encounters:  07/12/20 120/72  06/11/20 124/78  04/16/20 128/82    Wt Readings from Last 3 Encounters:  07/12/20 226 lb (102.5 kg)  06/11/20 225 lb 6.4 oz (102.2 kg)  04/16/20 223 lb 8 oz (101.4 kg)    General appearance: alert, cooperative and appears stated age Ears: normal TM's and  external ear canals both ears Throat: lips, mucosa, and tongue normal; teeth and gums normal Neck: no adenopathy, no carotid bruit, supple, symmetrical, trachea midline and thyroid not enlarged, symmetric, no tenderness/mass/nodules Back: symmetric, no curvature. ROM normal. No CVA tenderness. Lungs: clear to auscultation bilaterally Heart: regular rate and rhythm, S1, S2 normal, no murmur, click, rub or gallop Abdomen: soft, non-tender; bowel sounds normal; no masses,  no organomegaly Pulses: 2+ and symmetric Skin: Skin color,  texture, turgor normal. No rashes or lesions Lymph nodes: Cervical, supraclavicular, and axillary nodes normal.  Lab Results  Component Value Date   HGBA1C 5.4 09/01/2019   HGBA1C 5.5 01/04/2017    Lab Results  Component Value Date   CREATININE 0.75 06/11/2020   CREATININE 0.74 12/15/2019   CREATININE 0.86 09/01/2019    Lab Results  Component Value Date   WBC 6.2 06/11/2020   HGB 14.2 06/11/2020   HCT 42.0 06/11/2020   PLT 215 06/11/2020   GLUCOSE 97 06/11/2020   CHOL 195 12/15/2019   TRIG 211 (H) 12/15/2019   HDL 59 12/15/2019   LDLDIRECT 113.0 05/20/2016   LDLCALC 103 (H) 12/15/2019   ALT 18 06/11/2020   AST 17 06/11/2020   NA 141 06/11/2020   K 4.1 06/11/2020   CL 104 06/11/2020   CREATININE 0.75 06/11/2020   BUN 14 06/11/2020   CO2 27 06/11/2020   TSH 4.03 07/12/2020   HGBA1C 5.4 09/01/2019    No results found.  Assessment & Plan:   Problem List Items Addressed This Visit      Unprioritized   Essential hypertension    Well controlled on current regimen. Renal function stable, no changes today.      Incontinence of feces with fecal urgency    Chronic  Referral to GI      Relevant Orders   Ambulatory referral to Gastroenterology   Obesity (BMI 30.0-34.9)    I have addressed  BMI and recommended wt loss of 10% of body weigh over the next 6 months using a low glycemic index diet and regular exercise a minimum of 5 days per week.        OSA (obstructive sleep apnea)    . diagnosed with prior sleep study but treatment has been deferred d by patient.  Discussed the long term history of OSA , the risks of long term damage to heart and the signs and symptoms attributable to OSA.  Advised patient to consider  significant weight loss and/or use of CPAP.        Other Visit Diagnoses    Microscopic hematuria    -  Primary   Relevant Orders   Urinalysis, Routine w reflex microscopic (Completed)   Acquired hypothyroidism       Relevant Orders   TSH  (Completed)   Need for 23-polyvalent pneumococcal polysaccharide vaccine       Relevant Orders   Pneumococcal polysaccharide vaccine 23-valent greater than or equal to 2yo subcutaneous/IM (Completed)     A total of 40 minutes of face to face time was spent with patient more than half of which was spent in counselling and coordination of care  I am having Stanton Kidney K. Ratterree start on Zoster Vaccine Adjuvanted. I am also having her maintain her estradiol, acyclovir, citalopram, amLODipine, atorvastatin, levothyroxine, and levothyroxine.  Meds ordered this encounter  Medications  . Zoster Vaccine Adjuvanted [facility]) injection    Sig: Inject 0.5 mLs into the muscle once for 1 dose.    Dispense:  1 each    Refill:  0    DUE FROM September TO DECEMBER    There are no discontinued medications.  Follow-up: No follow-ups on file.   Crecencio Mc, MD

## 2020-07-12 NOTE — Patient Instructions (Addendum)
I recommend the [facility] for weight loss , BUT I THINK YOU SHOULD SEE GI FIRST   Diet for Irritable Bowel Syndrome When you have irritable bowel syndrome (IBS), it is very important to eat the foods and follow the eating habits that are best for your condition. IBS may cause various symptoms such as pain in the abdomen, constipation, or diarrhea. Choosing the right foods can help to ease the discomfort from these symptoms. Work with your health care provider and diet and nutrition specialist (dietitian) to find the eating plan that will help to control your symptoms. What are tips for following this plan?      Keep a food diary. This will help you identify foods that cause symptoms. Write down: ? What you eat and when you eat it. ? What symptoms you have. ? When symptoms occur in relation to your meals, such as "pain in abdomen 2 hours after dinner."  Eat your meals slowly and in a relaxed setting.  Aim to eat 5-6 small meals per day. Do not skip meals.  Drink enough fluid to keep your urine pale yellow.  Ask your health care provider if you should take an over-the-counter probiotic to help restore healthy bacteria in your gut (digestive tract). ? Probiotics are foods that contain good bacteria and yeasts.  Your dietitian may have specific dietary recommendations for you based on your symptoms. He or she may recommend that you: ? Avoid foods that cause symptoms. Talk with your dietitian about other ways to get the same nutrients that are in those problem foods. ? Avoid foods with gluten. Gluten is a protein that is found in rye, wheat, and barley. ? Eat more foods that contain soluble fiber. Examples of foods with high soluble fiber include oats, seeds, and certain fruits and vegetables. Take a fiber supplement if directed by your dietitian. ? Reduce or avoid certain foods called FODMAPs. These are foods that contain carbohydrates that are hard to digest. Ask your doctor which foods  contain these carbohydrates. What foods are not recommended? The following are some foods and drinks that may make your symptoms worse:  Fatty foods, such as french fries.  Foods that contain gluten, such as pasta and cereal.  Dairy products, such as milk, cheese, and ice cream.  Chocolate.  Alcohol.  Products with caffeine, such as coffee.  Carbonated drinks, such as soda.  Foods that are high in FODMAPs. These include certain fruits and vegetables.  Products with sweeteners such as honey, high fructose corn syrup, sorbitol, and mannitol. The items listed above may not be a complete list of foods and beverages you should avoid. Contact a dietitian for more information. What foods are good sources of fiber? Your health care provider or dietitian may recommend that you eat more foods that contain fiber. Fiber can help to reduce constipation and other IBS symptoms. Add foods with fiber to your diet a little at a time so your body can get used to them. Too much fiber at one time might cause gas and swelling of your abdomen. The following are some foods that are good sources of fiber:  Berries, such as raspberries, strawberries, and blueberries.  Tomatoes.  Carrots.  Brown rice.  Oats.  Seeds, such as chia and pumpkin seeds. The items listed above may not be a complete list of recommended sources of fiber. Contact your dietitian for more options. Where to find more information  International Foundation for Functional Gastrointestinal Disorders: www.iffgd.org  National  Institute of Diabetes and Digestive and Kidney Diseases: [employer] Summary  When you have irritable bowel syndrome (IBS), it is very important to eat the foods and follow the eating habits that are best for your condition.  IBS may cause various symptoms such as pain in the abdomen, constipation, or diarrhea.  Choosing the right foods can help to ease the discomfort that comes from symptoms.  Keep a  food diary. This will help you identify foods that cause symptoms.  Your health care provider or diet and nutrition specialist (dietitian) may recommend that you eat more foods that contain fiber. This information is not intended to replace advice given to you by your health care provider. Make sure you discuss any questions you have with your health care provider. Document Revised: 03/01/2019 Document Reviewed: 07/13/2017 Elsevier Patient Education  [place].

## 2020-07-14 DIAGNOSIS — R159 Full incontinence of feces: Secondary | ICD-10-CM | POA: Insufficient documentation

## 2020-07-14 NOTE — Assessment & Plan Note (Signed)
Well controlled on current regimen. Renal function stable, no changes today. 

## 2020-07-14 NOTE — Assessment & Plan Note (Signed)
I have addressed  BMI and recommended wt loss of 10% of body weigh over the next 6 months using a low glycemic index diet and regular exercise a minimum of 5 days per week.   

## 2020-07-14 NOTE — Assessment & Plan Note (Signed)
Chronic  Referral to GI

## 2020-07-14 NOTE — Assessment & Plan Note (Signed)
diagnosed with prior sleep study but treatment has been deferred d by patient.  Discussed the long term history of OSA , the risks of long term damage to heart and the signs and symptoms attributable to OSA.  Advised patient to consider  significant weight loss and/or use of CPAP.  

## 2020-07-15 ENCOUNTER — Telehealth: Payer: Self-pay

## 2020-07-15 ENCOUNTER — Other Ambulatory Visit: Payer: Self-pay

## 2020-07-15 ENCOUNTER — Other Ambulatory Visit (INDEPENDENT_AMBULATORY_CARE_PROVIDER_SITE_OTHER): Payer: Medicare HMO

## 2020-07-15 DIAGNOSIS — R8290 Unspecified abnormal findings in urine: Secondary | ICD-10-CM | POA: Diagnosis not present

## 2020-07-15 NOTE — Telephone Encounter (Signed)
Lab ordered placed per result note message.

## 2020-07-16 LAB — URINE CULTURE
MICRO NUMBER:: 10859278
SPECIMEN QUALITY:: ADEQUATE

## 2020-08-19 ENCOUNTER — Other Ambulatory Visit: Payer: Self-pay | Admitting: Internal Medicine

## 2020-08-19 DIAGNOSIS — Z1231 Encounter for screening mammogram for malignant neoplasm of breast: Secondary | ICD-10-CM

## 2020-09-06 ENCOUNTER — Telehealth: Payer: Self-pay

## 2020-09-06 DIAGNOSIS — E890 Postprocedural hypothyroidism: Secondary | ICD-10-CM

## 2020-09-06 NOTE — Telephone Encounter (Signed)
Pt needs a refill on levothyroxine 112 mg, citalopram, amlodipine, and atorvastatin sent to [facility].

## 2020-09-09 ENCOUNTER — Ambulatory Visit
Admission: RE | Admit: 2020-09-09 | Discharge: 2020-09-09 | Disposition: A | Discharge status: Home / Self Care | Payer: Medicare HMO | Source: Ambulatory Visit | Attending: Internal Medicine | Admitting: Internal Medicine

## 2020-09-09 ENCOUNTER — Other Ambulatory Visit: Payer: Self-pay

## 2020-09-09 DIAGNOSIS — Z1231 Encounter for screening mammogram for malignant neoplasm of breast: Secondary | ICD-10-CM | POA: Diagnosis present

## 2020-09-09 MED ORDER — LEVOTHYROXINE SODIUM 112 MCG PO TABS: 112.0000 ug | ORAL_TABLET | Freq: Every day | ORAL | 3 refills | Status: DC

## 2020-09-09 NOTE — Telephone Encounter (Signed)
[facility]

## 2020-09-24 ENCOUNTER — Other Ambulatory Visit: Payer: Self-pay

## 2020-09-24 DIAGNOSIS — F411 Generalized anxiety disorder: Secondary | ICD-10-CM

## 2020-09-24 NOTE — Telephone Encounter (Signed)
Refilled: 12/13/2019 Last OV: 07/12/2020 Next OV: not scheduled  Last filled by a different provider.

## 2020-09-24 NOTE — Telephone Encounter (Signed)
Need to clarify with patient dose she is taking .

## 2020-09-25 ENCOUNTER — Ambulatory Visit: Payer: Medicare HMO | Admitting: Gastroenterology

## 2020-09-25 ENCOUNTER — Other Ambulatory Visit: Payer: Self-pay

## 2020-09-25 ENCOUNTER — Encounter: Payer: Self-pay | Admitting: Gastroenterology

## 2020-09-25 VITALS — BP 132/76 | HR 65 | Ht 66.0 in | Wt 228.6 lb

## 2020-09-25 DIAGNOSIS — K591 Functional diarrhea: Secondary | ICD-10-CM

## 2020-09-25 DIAGNOSIS — R159 Full incontinence of feces: Secondary | ICD-10-CM | POA: Diagnosis not present

## 2020-09-25 DIAGNOSIS — R152 Fecal urgency: Secondary | ICD-10-CM | POA: Diagnosis not present

## 2020-09-25 MED ORDER — CITALOPRAM HYDROBROMIDE 20 MG PO TABS: 20.0000 mg | ORAL_TABLET | Freq: Every day | ORAL | 1 refills | Status: DC

## 2020-09-25 NOTE — Telephone Encounter (Signed)
Pt stated that she is taking 20 mg once daily.

## 2020-09-25 NOTE — Progress Notes (Signed)
Gastroenterology Consultation  Referring Provider:     Crecencio Mc, MD Primary Care Physician:  Crecencio Mc, MD Primary Gastroenterologist:  Dr. Allen Norris     Reason for Consultation:     Stool incontinence        HPI:   Julie Perez is a 74 y.o. y/o female referred for consultation & management of stool incontinence by Dr. Derrel Nip, Aris Everts, MD.  This patient comes to see me today after having a colonoscopy in 2014 by Dr. Gustavo Lah.  At that time the patient was having a colonoscopy for screening purposes.  The patient now comes in with loose stools and incontinence with multiple episodes of soiling herself.  She states that this has been going on for years and has start away a lot of close because of her accidents.  The patient reports that usually happens outside the home and is very unlikely to happen while she is home without any issues.  The patient denies any milk intake but she does eat cheese.  She also denies any ice cream use.  There is no report of any unexplained weight loss fevers chills nausea vomiting black stools or bloody stools.  There is no mention of any abdominal pain associated with her diarrhea.  She states that she never loses solid stools and it is always liquid stools. The patient is concerned because she is going on a plane soon and is scared of having an accident while on the plane.  The diarrhea never wakes her up from the sleep.  Past Medical History:  Diagnosis Date  . Anxiety   . Arthritis    lower back  . Frequent headaches    stress  related  . Grade I diastolic dysfunction 02/05/4007   Echo June 2019  . High cholesterol   . Hyperlipidemia   . Hypertension   . Hyperthyroidism   . Migraines   . Mild concentric left ventricular hypertrophy 05/17/2018   Echo June 2019  . S/P hysterectomy 09/12/2014   Due to fibroids,   . Thyroid disease     Past Surgical History:  Procedure Laterality Date  . ABDOMINAL HYSTERECTOMY  1979  . SHOULDER  ARTHROSCOPY WITH ROTATOR CUFF REPAIR Left 09/14/2019   Procedure: SHOULDER ARTHROSCOPY WITH ROTATOR CUFF REPAIR;  Surgeon: Lovell Sheehan, MD;  Location: [facility];  Service: Orthopedics;  Laterality: Left;  Marland Kitchen VAGINAL HYSTERECTOMY  1979    Prior to Admission medications   Medication Sig Start Date End Date Taking? Authorizing Provider  acyclovir (ZOVIRAX) 400 MG tablet TAKE 1 TABLET DAILY Patient taking differently: Take 400 mg by mouth as needed.  06/29/19  Yes Poulose, Bethel Born, NP  amLODipine (NORVASC) 5 MG tablet Take 1 tablet (5 mg total) by mouth at bedtime. 12/13/19  Yes Hubbard Hartshorn, FNP  atorvastatin (LIPITOR) 40 MG tablet Take 1 tablet (40 mg total) by mouth at bedtime. 12/13/19  Yes Hubbard Hartshorn, FNP  citalopram (CELEXA) 20 MG tablet Take 1/2 tablet once daily for 4 weeks.  If doing well, may stop medication. Patient taking differently: Take 20 mg by mouth daily.  12/13/19  Yes Hubbard Hartshorn, FNP  estradiol (ESTRACE VAGINAL) 0.1 MG/GM vaginal cream Apply 0.5mg  (pea-sized amount)  just inside the vaginal introitus with a finger-tip on Monday, Wednesday and Friday nights. 09/06/18  Yes McGowan, Hunt Oris, PA-C  levothyroxine (SYNTHROID) 112 MCG tablet Take 1 tablet (112 mcg total) by mouth daily before breakfast. 09/09/20  Yes Crecencio Mc, MD  levothyroxine (SYNTHROID) 25 MCG tablet Take 25 mcg tablet with 112 mcg tablet (total 137 mcg) daily in the morning before breakfast Patient not taking: Reported on 07/12/2020 06/12/20   Delsa Grana, PA-C    Family History  Problem Relation Age of Onset  . Hypertension Mother   . Alzheimer's disease Mother   . Cancer Father 61       prostate cancer  . Cancer Sister 35       breast cancer  . Breast cancer Sister 82  . Aneurysm Sister   . Diabetes Brother        type 2  . Heart disease Sister        cabg x 4, tobacco abuse  . Diabetes Sister        type 2  . Lung cancer Nephew      Social History   Tobacco Use  .  Smoking status: Never Smoker  . Smokeless tobacco: Never Used  . Tobacco comment: smoking cessation materials not required  Vaping Use  . Vaping Use: Never used  Substance Use Topics  . Alcohol use: Yes    Alcohol/week: 1.0 standard drink    Types: 1 Glasses of wine per week    Comment: rare  . Drug use: No    Allergies as of 09/25/2020  . (No Known Allergies)    Review of Systems:    All systems reviewed and negative except where noted in HPI.   Physical Exam:  BP 132/76   Pulse 65   Ht 5\' 6"  (1.676 m)   Wt 228 lb 9.6 oz (103.7 kg)   BMI 36.90 kg/m  No LMP recorded. Patient has had a hysterectomy. General:   Alert,  Well-developed, well-nourished, pleasant and cooperative in NAD Head:  Normocephalic and atraumatic. Eyes:  Sclera clear, no icterus.   Conjunctiva pink. Ears:  Normal auditory acuity. Neck:  Supple; no masses or thyromegaly. Lungs:  Respirations even and unlabored.  Clear throughout to auscultation.   No wheezes, crackles, or rhonchi. No acute distress. Heart:  Regular rate and rhythm; no murmurs, clicks, rubs, or gallops. Abdomen:  Normal bowel sounds.  No bruits.  Soft, non-tender and non-distended without masses, hepatosplenomegaly or hernias noted.  No guarding or rebound tenderness.  Negative Carnett sign.   Rectal:  Deferred.  Pulses:  Normal pulses noted. Extremities:  No clubbing or edema.  No cyanosis. Neurologic:  Alert and oriented x3;  grossly normal neurologically. Skin:  Intact without significant lesions or rashes.  No jaundice. Lymph Nodes:  No significant cervical adenopathy. Psych:  Alert and cooperative. Normal mood and affect.  Imaging Studies: MM 3D SCREEN BREAST BILATERAL  Result Date: 09/11/2020 CLINICAL DATA:  Screening. EXAM: DIGITAL SCREENING BILATERAL MAMMOGRAM WITH TOMO AND CAD COMPARISON:  Previous exam(s). ACR Breast Density Category b: There are scattered areas of fibroglandular density. FINDINGS: There are no findings  suspicious for malignancy. Images were processed with CAD. IMPRESSION: No mammographic evidence of malignancy. A result letter of this screening mammogram will be mailed directly to the patient. RECOMMENDATION: Screening mammogram in one year. (Code:SM-B-01Y) BI-RADS CATEGORY  1: Negative. Electronically Signed   By: Ammie Ferrier M.D.   On: 09/11/2020 11:28    Assessment and Plan:   Julie Perez is a 74 y.o. y/o female who comes in with intermittent diarrhea usually on days when she leaves the house.  She has soiled herself multiple times but has no worry symptoms such  as unexplained weight loss family history of colon cancer or colon polyps, black stools or bloody stools or the diarrhea waking her up from sleep.  This is most likely associated with either milk intolerance versus irritable bowel syndrome versus microscopic colitis.  The patient has been told to avoid dairy products for 1 week and see if that helps.  If it does not then taking Citrucel once to twice a day to see if that helps her symptoms.  The patient has been told to also take Imodium as needed prior to going out so that she does not have accidents.  If her symptoms do not improve the patient has been told that she will need a colonoscopy for biopsies to rule out microscopic colitis.  The interpretation of her colonoscopy done in 2014 did not show any cause for her present symptoms.  The patient has been explained the plan agrees with it.    Lucilla Lame, MD. Marval Regal    Note: This dictation was prepared with Dragon dictation along with smaller phrase technology. Any transcriptional errors that result from this process are unintentional.

## 2020-10-01 ENCOUNTER — Other Ambulatory Visit: Payer: Self-pay

## 2020-10-01 DIAGNOSIS — F411 Generalized anxiety disorder: Secondary | ICD-10-CM

## 2020-10-01 MED ORDER — CITALOPRAM HYDROBROMIDE 20 MG PO TABS: 20.0000 mg | ORAL_TABLET | Freq: Every day | ORAL | 1 refills | Status: DC

## 2020-10-02 DIAGNOSIS — Z23 Encounter for immunization: Secondary | ICD-10-CM | POA: Diagnosis not present

## 2020-10-30 ENCOUNTER — Ambulatory Visit: Payer: Medicare HMO | Admitting: Urology

## 2020-11-02 ENCOUNTER — Other Ambulatory Visit: Payer: Self-pay

## 2020-11-02 ENCOUNTER — Encounter: Payer: Self-pay | Admitting: Emergency Medicine

## 2020-11-02 ENCOUNTER — Ambulatory Visit
Admission: EM | Admit: 2020-11-02 | Discharge: 2020-11-02 | Disposition: A | Discharge status: Home / Self Care | Payer: Medicare HMO | Attending: Physician Assistant | Admitting: Physician Assistant

## 2020-11-02 DIAGNOSIS — E890 Postprocedural hypothyroidism: Secondary | ICD-10-CM | POA: Diagnosis not present

## 2020-11-02 DIAGNOSIS — Z90710 Acquired absence of both cervix and uterus: Secondary | ICD-10-CM | POA: Insufficient documentation

## 2020-11-02 DIAGNOSIS — F411 Generalized anxiety disorder: Secondary | ICD-10-CM | POA: Diagnosis not present

## 2020-11-02 DIAGNOSIS — M1990 Unspecified osteoarthritis, unspecified site: Secondary | ICD-10-CM | POA: Diagnosis not present

## 2020-11-02 DIAGNOSIS — Z79899 Other long term (current) drug therapy: Secondary | ICD-10-CM | POA: Diagnosis not present

## 2020-11-02 DIAGNOSIS — E785 Hyperlipidemia, unspecified: Secondary | ICD-10-CM | POA: Diagnosis not present

## 2020-11-02 DIAGNOSIS — Z20822 Contact with and (suspected) exposure to covid-19: Secondary | ICD-10-CM | POA: Diagnosis not present

## 2020-11-02 DIAGNOSIS — J069 Acute upper respiratory infection, unspecified: Secondary | ICD-10-CM

## 2020-11-02 DIAGNOSIS — I10 Essential (primary) hypertension: Secondary | ICD-10-CM | POA: Diagnosis not present

## 2020-11-02 DIAGNOSIS — Z79890 Hormone replacement therapy (postmenopausal): Secondary | ICD-10-CM | POA: Diagnosis not present

## 2020-11-02 DIAGNOSIS — G43909 Migraine, unspecified, not intractable, without status migrainosus: Secondary | ICD-10-CM | POA: Insufficient documentation

## 2020-11-02 DIAGNOSIS — R051 Acute cough: Secondary | ICD-10-CM | POA: Insufficient documentation

## 2020-11-02 DIAGNOSIS — G4733 Obstructive sleep apnea (adult) (pediatric): Secondary | ICD-10-CM | POA: Insufficient documentation

## 2020-11-02 LAB — RESP PANEL BY RT-PCR (FLU A&B, COVID) ARPGX2
Influenza A by PCR: NEGATIVE
Influenza B by PCR: NEGATIVE
SARS Coronavirus 2 by RT PCR: NEGATIVE

## 2020-11-02 MED ORDER — BENZONATATE 100 MG PO CAPS: ORAL_CAPSULE | ORAL | 0 refills | Status: DC

## 2020-11-02 NOTE — ED Provider Notes (Signed)
[facility]    CSN: [id number] Arrival date & time: 11/02/20  1332      History   Chief Complaint Chief Complaint  Patient presents with   Cough    HPI Julie Perez is a 74 y.o. female.   74 year old female presents with sore throat, headache, cough and fatigue for the past 5 days. Denies any fever, nasal congestion or GI symptoms. Has been vaccinated against COVID 19. Has been taking AlkaSeltzer and Tylenol with minimal relief. Other chronic health issues include HTN, hyperlipidemia, anxiety, arthritis, thyroid disorder, sleep apnea and migraine headaches. Currently on Norvasc, Lipitor, Synthroid, Celexa daily and Zovirax prn.   The history is provided by the patient.    Past Medical History:  Diagnosis Date   Anxiety    Arthritis    lower back   Frequent headaches    stress  related   Grade I diastolic dysfunction 1/60/7371   Echo June 2019   High cholesterol    Hyperlipidemia    Hypertension    Hyperthyroidism    Migraines    Mild concentric left ventricular hypertrophy 05/17/2018   Echo June 2019   S/P hysterectomy 09/12/2014   Due to fibroids,    Thyroid disease     Patient Active Problem List   Diagnosis Date Noted   Incontinence of feces with fecal urgency 07/14/2020   Mild concentric left ventricular hypertrophy 05/17/2018   Grade I diastolic dysfunction 05/18/9484   DDD (degenerative disc disease), lumbar 02/07/2018   Right lumbar radiculopathy 07/05/2017   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   Hypokalemia 05/04/2017   Obesity (BMI 30.0-34.9) 01/05/2017   Cardiomegaly 04/20/2015   Essential hypertension 04/20/2015   Long-term use of high-risk medication 11/19/2014   Cephalalgia 10/02/2014   Postmenopausal atrophic vaginitis 09/15/2014   Generalized anxiety disorder 09/15/2014   B12 deficiency 06/22/2014   Vitamin D deficiency 06/22/2014   OSA (obstructive sleep apnea) 06/22/2014    Hypothyroidism, 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      Home Medications    Prior to Admission medications   Medication Sig Start Date End Date Taking? Authorizing Provider  acyclovir (ZOVIRAX) 400 MG tablet TAKE 1 TABLET DAILY Patient taking differently: Take 400 mg by mouth as needed. 06/29/19  Yes Poulose, Bethel Born, NP  amLODipine (NORVASC) 5 MG tablet Take 1 tablet (5 mg total) by mouth at bedtime. 12/13/19  Yes Hubbard Hartshorn, FNP  atorvastatin (LIPITOR) 40 MG tablet Take 1 tablet (40 mg total) by mouth at bedtime. 12/13/19  Yes Hubbard Hartshorn, FNP  citalopram (CELEXA) 20 MG tablet Take 1 tablet (20 mg total) by mouth daily. 10/01/20  Yes Crecencio Mc, MD  estradiol (ESTRACE VAGINAL) 0.1 MG/GM vaginal cream Apply 0.5mg  (pea-sized amount)  just inside the vaginal introitus with a finger-tip on Monday, Wednesday and Friday nights. 09/06/18  Yes McGowan, Hunt Oris, PA-C  levothyroxine (SYNTHROID) 112 MCG tablet Take 1 tablet (112 mcg total) by mouth daily before breakfast. 09/09/20  Yes Crecencio Mc, MD  benzonatate (  TESSALON) 100 MG capsule Take 1 to 2 capsules by mouth every 8 hours as needed for cough. 11/02/20   AmyotNicholes Stairs, NP    Family History Family History  Problem Relation Age of Onset   Hypertension Mother    Alzheimer's disease Mother    Cancer Father 76       prostate cancer   Cancer Sister 87       breast cancer   Breast cancer Sister 15   Aneurysm Sister    Diabetes Brother        type 2   Heart disease Sister         cabg x 4, tobacco abuse   Diabetes Sister        type 2   Lung cancer Nephew     Social History Social History   Tobacco Use   Smoking status: Never Smoker   Smokeless tobacco: Never Used   Tobacco comment: smoking cessation materials not required  Vaping Use   Vaping Use: Never used  Substance Use Topics   Alcohol use: Yes    Alcohol/week: 1.0 standard drink    Types: 1 Glasses of wine per week    Comment: rare   Drug use: No     Allergies   Patient has no known allergies.   Review of Systems Review of Systems  Constitutional: Positive for fatigue. Negative for activity change, appetite change, chills and fever.  HENT: Positive for postnasal drip and sore throat. Negative for congestion, ear discharge, ear pain, facial swelling, mouth sores, nosebleeds, rhinorrhea, sinus pressure, sinus pain and trouble swallowing.   Eyes: Negative for pain, discharge, redness and itching.  Respiratory: Positive for cough. Negative for chest tightness, shortness of breath and wheezing.   Gastrointestinal: Negative for diarrhea, nausea and vomiting.  Musculoskeletal: Positive for arthralgias. Negative for myalgias, neck pain and neck stiffness.  Skin: Negative for color change and rash.  Allergic/Immunologic: Negative for environmental allergies, food allergies and immunocompromised state.  Neurological: Positive for headaches. Negative for dizziness, seizures, syncope, weakness and light-headedness.  Hematological: Negative for adenopathy. Does not bruise/bleed easily.  Psychiatric/Behavioral: Positive for sleep disturbance.     Physical Exam Triage Vital Signs ED Triage Vitals  Enc Vitals Group     BP 11/02/20 1437 140/78     Pulse Rate 11/02/20 1437 70     Resp 11/02/20 1437 18     Temp 11/02/20 1437 98.8 F (37.1 C)     Temp Source 11/02/20 1437 Oral     SpO2 11/02/20 1437 97 %     Weight 11/02/20 1435 228 lb 9.9 oz (103.7 kg)     Height 11/02/20 1435 5\' 6"   (1.676 m)     Head Circumference --      Peak Flow --      Pain Score 11/02/20 1435 6     Pain Loc --      Pain Edu? --      Excl. in [place]? --    No data found.  Updated Vital Signs BP 140/78 (BP Location: Left Arm)    Pulse 70    Temp 98.8 F (37.1 C) (Oral)    Resp 18    Ht 5\' 6"  (1.676 m)    Wt 228 lb 9.9 oz (103.7 kg)    SpO2 97%    BMI 36.90 kg/m   Visual Acuity Right Eye Distance:   Left Eye Distance:   Bilateral Distance:    Right Eye Near:  Left Eye Near:    Bilateral Near:     Physical Exam Vitals and nursing note reviewed.  Constitutional:      General: She is awake. She is not in acute distress.    Appearance: She is well-developed and well-groomed.     Comments: She is sitting comfortably on the exam table in no acute distress but appears tired.   HENT:     Head: Normocephalic and atraumatic.     Right Ear: Hearing, tympanic membrane, ear canal and external ear normal.     Left Ear: Hearing, tympanic membrane, ear canal and external ear normal.     Nose: No congestion.     Right Sinus: No maxillary sinus tenderness or frontal sinus tenderness.     Left Sinus: No maxillary sinus tenderness or frontal sinus tenderness.     Mouth/Throat:     Lips: Pink.     Pharynx: Uvula midline. Posterior oropharyngeal erythema present. No pharyngeal swelling, oropharyngeal exudate or uvula swelling.  Eyes:     Extraocular Movements: Extraocular movements intact.     Conjunctiva/sclera: Conjunctivae normal.  Cardiovascular:     Rate and Rhythm: Normal rate and regular rhythm.     Heart sounds: Normal heart sounds. No murmur heard.   Pulmonary:     Effort: Pulmonary effort is normal. No respiratory distress.     Breath sounds: Normal air entry. No decreased air movement. Examination of the right-upper field reveals decreased breath sounds. Examination of the left-upper field reveals decreased breath sounds. Decreased breath sounds present. No wheezing, rhonchi or rales.      Comments: Slight decreased breath sounds bilaterally upper lobes.  Musculoskeletal:     Cervical back: Normal range of motion and neck supple. No rigidity or tenderness.  Lymphadenopathy:     Cervical: No cervical adenopathy.  Skin:    General: Skin is warm and dry.     Capillary Refill: Capillary refill takes less than 2 seconds.     Findings: No rash.  Neurological:     General: No focal deficit present.     Mental Status: She is alert and oriented to person, place, and time.  Psychiatric:        Mood and Affect: Mood normal.        Behavior: Behavior normal. Behavior is cooperative.        Thought Content: Thought content normal.        Judgment: Judgment normal.      UC Treatments / Results  Labs (all labs ordered are listed, but only abnormal results are displayed) Labs Reviewed  RESP PANEL BY RT-PCR (FLU A&B, COVID) [id number]    EKG   Radiology No results found.  Procedures Procedures (including critical care time)  Medications Ordered in UC Medications - No data to display  Initial Impression / Assessment and Plan / UC Course  I have reviewed the triage vital signs and the nursing notes.  Pertinent labs & imaging results that were available during my care of the patient were reviewed by me and considered in my medical decision making (see chart for details).    Reviewed negative rapid COVID 19 and Influenza test results with patient. Discussed that she probably has a respiratory viral illness. Recommend start Tessalon cough pills 1 to 2 every 8 hours as needed. Continue to push fluids to help loosen up mucus in sinuses and chest. May continue Tylenol 1000mg  every 8 hours as needed. Rest. Follow-up in 3 to 4 days if not improving.  Final Clinical Impressions(s) / UC Diagnoses   Final diagnoses:  Viral URI with cough     Discharge Instructions     Recommend start Tessalon cough pills - take 1 to 2 every 8 hours as needed. Continue to push fluids to help  loosen up mucus in sinuses and chest. May continue Tylenol 1000mg  every 8 hours as needed. Rest. Follow-up in 3 to 4 days if not improving.     ED Prescriptions    Medication Sig Dispense Auth. Provider   benzonatate (TESSALON) 100 MG capsule Take 1 to 2 capsules by mouth every 8 hours as needed for cough. 30 capsule Zarielle Cea, Nicholes Stairs, NP     PDMP not reviewed this encounter.   Katy Apo, NP 11/02/20 2349

## 2020-11-02 NOTE — ED Triage Notes (Signed)
Pt c/o cough, sore throat, headache. Started about 4 days ago. Denies fever. She has had covid vaccines.

## 2020-11-02 NOTE — Discharge Instructions (Addendum)
Recommend start Tessalon cough pills - take 1 to 2 every 8 hours as needed. Continue to push fluids to help loosen up mucus in sinuses and chest. May continue Tylenol 1000mg  every 8 hours as needed. Rest. Follow-up in 3 to 4 days if not improving.

## 2020-11-04 ENCOUNTER — Encounter: Payer: Self-pay | Admitting: Family

## 2020-11-04 ENCOUNTER — Ambulatory Visit (INDEPENDENT_AMBULATORY_CARE_PROVIDER_SITE_OTHER): Payer: Medicare HMO | Admitting: Family

## 2020-11-04 VITALS — Ht 66.0 in | Wt 225.0 lb

## 2020-11-04 DIAGNOSIS — J069 Acute upper respiratory infection, unspecified: Secondary | ICD-10-CM

## 2020-11-04 MED ORDER — ALBUTEROL SULFATE HFA 108 (90 BASE) MCG/ACT IN AERS: 2.0000 | INHALATION_SPRAY | Freq: Four times a day (QID) | RESPIRATORY_TRACT | 0 refills | Status: DC | PRN

## 2020-11-04 MED ORDER — AZITHROMYCIN 250 MG PO TABS: ORAL_TABLET | ORAL | 0 refills | Status: DC

## 2020-11-04 MED ORDER — HYDROCOD POLST-CPM POLST ER 10-8 MG/5ML PO SUER: 5.0000 mL | Freq: Two times a day (BID) | ORAL | 0 refills | Status: DC | PRN

## 2020-11-04 NOTE — Assessment & Plan Note (Signed)
No acute respiratory distress. Dry persistent cough while on the phone today. She is not labored in her speech. Based on duration of symptoms, we agreed she may have developed secondary bacterial infection. Start zpak, probiotics, albuterol, and tussionex. She politely declines CXR, being retested for covid however advised if symptoms were to change let me know immediately as monoclonal antibody infusion is time sensitive. She is aware of all.

## 2020-11-04 NOTE — Progress Notes (Signed)
Verbal consent for services obtained from patient prior to services given to TELEPHONE visit:   Location of call:  provider at work patient at home  Names of all persons present for services: Mable Paris, NP and patient Chief complaint:   Dry cough for 6 days, worsening.  Cough is persistent throughout the day.  Endorses ear pressure. No sob, wheezing, cp, rattle in chest, nasal congestion, fever, sinus pressure.  Has been taking tessalon without relief. Has taken narcotics in the past without side effect.   PCR flu and covid negative 11/02/20 which was 2 days after symptom onset She is covid vaccinated with booster.   No recent antibiotics.  No known covid contacts.  Never smoker. No  H/o lung disease.     A/P/next steps:  Problem List Items Addressed This Visit      Respiratory   URI (upper respiratory infection) - Primary    No acute respiratory distress. Dry persistent cough while on the phone today. She is not labored in her speech. Based on duration of symptoms, we agreed she may have developed secondary bacterial infection. Start zpak, probiotics, albuterol, and tussionex. She politely declines CXR, being retested for covid however advised if symptoms were to change let me know immediately as monoclonal antibody infusion is time sensitive. She is aware of all.       Relevant Medications   azithromycin (ZITHROMAX) 250 MG tablet   albuterol (VENTOLIN HFA) 108 (90 Base) MCG/ACT inhaler   chlorpheniramine-HYDROcodone (TUSSIONEX PENNKINETIC ER) 10-8 MG/5ML SUER       I spent 18 min  discussing plan of care over the phone.

## 2020-11-07 ENCOUNTER — Other Ambulatory Visit (INDEPENDENT_AMBULATORY_CARE_PROVIDER_SITE_OTHER): Payer: Medicare HMO

## 2020-11-07 ENCOUNTER — Telehealth: Payer: Self-pay

## 2020-11-07 ENCOUNTER — Other Ambulatory Visit: Payer: Self-pay

## 2020-11-07 DIAGNOSIS — J069 Acute upper respiratory infection, unspecified: Secondary | ICD-10-CM | POA: Diagnosis not present

## 2020-11-07 LAB — POCT INFLUENZA A/B
Influenza A, POC: NEGATIVE
Influenza B, POC: NEGATIVE

## 2020-11-07 NOTE — Telephone Encounter (Signed)
Just FYI patient wanted PCR done & I ordered that as well as flu. She is scheduled to be tested today at 2:30p.

## 2020-11-07 NOTE — Telephone Encounter (Signed)
So sorry meant to send to [facility].

## 2020-11-07 NOTE — Telephone Encounter (Signed)
Pt states that Mable Paris, NP told her at her visit that she could come to get a covid test-there are no orders in there. Pt was tested on Saturday at walk in clinic. She states that Joycelyn Schmid said she needed to be tested again later in the week. Please advise

## 2020-11-08 LAB — NOVEL CORONAVIRUS, NAA: SARS-CoV-2, NAA: NOT DETECTED

## 2020-11-08 LAB — SARS-COV-2, NAA 2 DAY TAT

## 2020-11-12 ENCOUNTER — Ambulatory Visit
Admission: RE | Admit: 2020-11-12 | Discharge: 2020-11-12 | Disposition: A | Discharge status: Home / Self Care | Payer: Medicare HMO | Source: Ambulatory Visit | Attending: Internal Medicine | Admitting: Internal Medicine

## 2020-11-12 ENCOUNTER — Ambulatory Visit
Admission: RE | Admit: 2020-11-12 | Discharge: 2020-11-12 | Disposition: A | Discharge status: Home / Self Care | Payer: Medicare HMO | Attending: Internal Medicine | Admitting: Internal Medicine

## 2020-11-12 ENCOUNTER — Telehealth (INDEPENDENT_AMBULATORY_CARE_PROVIDER_SITE_OTHER): Payer: Medicare HMO | Admitting: Internal Medicine

## 2020-11-12 ENCOUNTER — Other Ambulatory Visit: Payer: Self-pay

## 2020-11-12 ENCOUNTER — Telehealth: Payer: Self-pay | Admitting: Internal Medicine

## 2020-11-12 ENCOUNTER — Encounter: Payer: Self-pay | Admitting: Internal Medicine

## 2020-11-12 VITALS — Ht 65.98 in | Wt 225.0 lb

## 2020-11-12 DIAGNOSIS — J069 Acute upper respiratory infection, unspecified: Secondary | ICD-10-CM | POA: Diagnosis not present

## 2020-11-12 DIAGNOSIS — R059 Cough, unspecified: Secondary | ICD-10-CM | POA: Diagnosis not present

## 2020-11-12 DIAGNOSIS — J40 Bronchitis, not specified as acute or chronic: Secondary | ICD-10-CM

## 2020-11-12 DIAGNOSIS — R0981 Nasal congestion: Secondary | ICD-10-CM

## 2020-11-12 MED ORDER — PREDNISONE 20 MG PO TABS: 20.0000 mg | ORAL_TABLET | Freq: Every day | ORAL | 0 refills | Status: DC

## 2020-11-12 MED ORDER — SALINE SPRAY 0.65 % NA SOLN: 2.0000 | NASAL | 11 refills | Status: DC | PRN

## 2020-11-12 MED ORDER — ALBUTEROL SULFATE HFA 108 (90 BASE) MCG/ACT IN AERS
1.0000 | INHALATION_SPRAY | Freq: Four times a day (QID) | RESPIRATORY_TRACT | 0 refills | Status: DC | PRN
Start: 2020-11-12 — End: 2020-12-30

## 2020-11-12 MED ORDER — HYDROCOD POLST-CPM POLST ER 10-8 MG/5ML PO SUER: 5.0000 mL | Freq: Every evening | ORAL | 0 refills | Status: DC | PRN

## 2020-11-12 NOTE — Telephone Encounter (Signed)
Spoke with pt and she has been scheduled with Dr. Olivia Mackie today at 2pm virtual visit.

## 2020-11-12 NOTE — Telephone Encounter (Signed)
Patient called in wanted to know if she is contagious  have had a cough and congestion for three weeks no fever

## 2020-11-12 NOTE — Progress Notes (Addendum)
Telephone Note  I connected with Luisa Hart on 11/12/20 at  2:30 PM EST by telephone and verified that I am speaking with the correct person using two identifiers.  Location patient: home, [place] Location provider:work or home office Persons participating in the virtual visit: patient, provider  I discussed the limitations of evaluation and management by telemedicine and the availability of in person appointments. The patient expressed understanding and agreed to proceed.   HPI: Dry cough x 2-3 weeks no h/o asthma/copd, allergies also has nose congestion tried mucinex dm and zpack given 11/04/20 no wheezing/sob, fever, cough improved voice is hoarse  Flu neg 11/07/20 and covid neg 12/11 and 11/07/20  She wants to know if she is contagious as 73 y.o grandson had cancer in remission   ROS: See pertinent positives and negatives per HPI.  Past Medical History:  Diagnosis Date  . Anxiety   . Arthritis    lower back  . Frequent headaches    stress  related  . Grade I diastolic dysfunction 1/61/0960   Echo June 2019  . High cholesterol   . Hyperlipidemia   . Hypertension   . Hyperthyroidism   . Migraines   . Mild concentric left ventricular hypertrophy 05/17/2018   Echo June 2019  . S/P hysterectomy 09/12/2014   Due to fibroids,   . Thyroid disease     Past Surgical History:  Procedure Laterality Date  . ABDOMINAL HYSTERECTOMY  1979  . SHOULDER ARTHROSCOPY WITH ROTATOR CUFF REPAIR Left 09/14/2019   Procedure: SHOULDER ARTHROSCOPY WITH ROTATOR CUFF REPAIR;  Surgeon: Lovell Sheehan, MD;  Location: [facility];  Service: Orthopedics;  Laterality: Left;  Marland Kitchen VAGINAL HYSTERECTOMY  1979     Current Outpatient Medications:  .  acyclovir (ZOVIRAX) 400 MG tablet, TAKE 1 TABLET DAILY (Patient taking differently: Take 400 mg by mouth as needed.), Disp: 90 tablet, Rfl: 3 .  albuterol (VENTOLIN HFA) 108 (90 Base) MCG/ACT inhaler, Inhale 2 puffs into the lungs every 6 (six) hours as  needed for wheezing or shortness of breath., Disp: 8 g, Rfl: 0 .  amLODipine (NORVASC) 5 MG tablet, Take 1 tablet (5 mg total) by mouth at bedtime., Disp: 90 tablet, Rfl: 3 .  atorvastatin (LIPITOR) 40 MG tablet, Take 1 tablet (40 mg total) by mouth at bedtime., Disp: 90 tablet, Rfl: 3 .  azithromycin (ZITHROMAX) 250 MG tablet, Tale 500 mg PO on day 1, then 250 mg PO q24h x 4 days., Disp: 6 tablet, Rfl: 0 .  benzonatate (TESSALON) 100 MG capsule, Take 1 to 2 capsules by mouth every 8 hours as needed for cough., Disp: 30 capsule, Rfl: 0 .  citalopram (CELEXA) 20 MG tablet, Take 1 tablet (20 mg total) by mouth daily., Disp: 90 tablet, Rfl: 1 .  estradiol (ESTRACE VAGINAL) 0.1 MG/GM vaginal cream, Apply 0.5mg  (pea-sized amount)  just inside the vaginal introitus with a finger-tip on Monday, Wednesday and Friday nights., Disp: 30 g, Rfl: 12 .  levothyroxine (SYNTHROID) 112 MCG tablet, Take 1 tablet (112 mcg total) by mouth daily before breakfast., Disp: 90 tablet, Rfl: 3 .  albuterol (VENTOLIN HFA) 108 (90 Base) MCG/ACT inhaler, Inhale 1-2 puffs into the lungs every 6 (six) hours as needed for wheezing or shortness of breath., Disp: 18 g, Rfl: 0 .  chlorpheniramine-HYDROcodone (TUSSIONEX PENNKINETIC ER) 10-8 MG/5ML SUER, Take 5 mLs by mouth at bedtime as needed for cough., Disp: 115 mL, Rfl: 0 .  predniSONE (DELTASONE) 20 MG tablet, Take 1  tablet (20 mg total) by mouth daily with breakfast. X 5-7 days in the am, Disp: 7 tablet, Rfl: 0 .  sodium chloride (OCEAN) 0.65 % SOLN nasal spray, Place 2 sprays into both nostrils as needed for congestion., Disp: 30 mL, Rfl: 11  EXAM:  VITALS per patient if applicable:  GENERAL: alert, oriented, appears well and in no acute distress  PSYCH/NEURO: pleasant and cooperative, no obvious depression or anxiety, speech and thought processing grossly intact  ASSESSMENT AND PLAN:  Discussed the following assessment and plan:  Bronchitis improving- Plan: DG Chest 2  View, chlorpheniramine-HYDROcodone (TUSSIONEX PENNKINETIC ER) 10-8 MG/5ML SUER albuterol (VENTOLIN HFA) 108 (90 Base) MCG/ACT inhaler, predniSONE (DELTASONE) 20 MG tablet  Nasal congestion - Plan: sodium chloride (OCEAN) 0.65 % SOLN nasal spray  -we discussed possible serious and likely etiologies, options for evaluation and workup, limitations of telemedicine visit vs in person visit, treatment, treatment risks and precautions.    I discussed the assessment and treatment plan with the patient. The patient was provided an opportunity to ask questions and all were answered. The patient agreed with the plan and demonstrated an understanding of the instructions.    Time 20 min Delorise Jackson, MD

## 2020-11-29 ENCOUNTER — Other Ambulatory Visit: Payer: Self-pay | Admitting: Internal Medicine

## 2020-11-29 ENCOUNTER — Telehealth: Payer: Self-pay | Admitting: Internal Medicine

## 2020-11-29 DIAGNOSIS — R059 Cough, unspecified: Secondary | ICD-10-CM

## 2020-11-29 DIAGNOSIS — J069 Acute upper respiratory infection, unspecified: Secondary | ICD-10-CM

## 2020-11-29 DIAGNOSIS — J40 Bronchitis, not specified as acute or chronic: Secondary | ICD-10-CM

## 2020-11-29 MED ORDER — HYDROCOD POLST-CPM POLST ER 10-8 MG/5ML PO SUER: 5.0000 mL | Freq: Every evening | ORAL | 0 refills | Status: DC | PRN

## 2020-11-29 NOTE — Telephone Encounter (Signed)
Pt called she wants a refill on chlorpheniramine-HYDROcodone she said that she is still having a cough She saw Dr. Olivia Mackie on 11/12/20

## 2020-11-29 NOTE — Telephone Encounter (Signed)
Please advise 

## 2020-12-16 ENCOUNTER — Ambulatory Visit: Payer: Medicare HMO | Admitting: Family Medicine

## 2020-12-18 ENCOUNTER — Ambulatory Visit: Payer: Medicare HMO | Admitting: Internal Medicine

## 2020-12-30 ENCOUNTER — Encounter: Payer: Self-pay | Admitting: Internal Medicine

## 2020-12-30 ENCOUNTER — Ambulatory Visit (INDEPENDENT_AMBULATORY_CARE_PROVIDER_SITE_OTHER): Payer: 59 | Admitting: Internal Medicine

## 2020-12-30 ENCOUNTER — Other Ambulatory Visit: Payer: Self-pay

## 2020-12-30 DIAGNOSIS — R059 Cough, unspecified: Secondary | ICD-10-CM

## 2020-12-30 DIAGNOSIS — J40 Bronchitis, not specified as acute or chronic: Secondary | ICD-10-CM | POA: Diagnosis not present

## 2020-12-30 DIAGNOSIS — R053 Chronic cough: Secondary | ICD-10-CM | POA: Diagnosis not present

## 2020-12-30 DIAGNOSIS — J069 Acute upper respiratory infection, unspecified: Secondary | ICD-10-CM

## 2020-12-30 MED ORDER — BENZONATATE 100 MG PO CAPS: ORAL_CAPSULE | ORAL | 0 refills | Status: DC

## 2020-12-30 MED ORDER — HYDROCOD POLST-CPM POLST ER 10-8 MG/5ML PO SUER: 5.0000 mL | Freq: Every evening | ORAL | 0 refills | Status: DC | PRN

## 2020-12-30 NOTE — Progress Notes (Signed)
Office visit converted to Telephone Visit    This visit type was conducted due to national recommendations for restrictions regarding the COVID-19 pandemic (e.g. social distancing).  This format is felt to be most appropriate for this patient at this time.  All issues noted in this document were discussed and addressed.  No physical exam was performed (except for noted visual exam findings with Video Visits).   I connected with@ on 12/30/20 at  8:00 AM EST by  TELEPHONE   and verified that I am speaking with the correct person using two identifiers. Location patient: home Location provider: work or home office Persons participating in the virtual visit: patient, provider  I discussed the limitations, risks, security and privacy concerns of performing an evaluation and management service by telephone and the availability of in person appointments. I also discussed with the patient that there may be a patient responsible charge related to this service. The patient expressed understanding and agreed to proceed.  Reason for visit: follow up on cough   HPI:  75 yr old  Female treated multiple times in the past 2 months for bronchitis ,  COVID negative x 3, ,  With no improvement despite abx,  prednisone , and cough suppressant. Started taking omeprazole 20 mg 3 days ago at the suggestio of her son,  And cough seems to b less frequent.    Reviewed history and chest x ray done dec 21   2) Patient is taking her medications as prescribed and notes no adverse effects.  Home BP readings have not been done lately .  She is avoiding added salt in her diet and walking regularly about 3 times per week for exercise  .  3) since her last visit with me she has seen urology and GI .  Visits reviewed    ROS: Patient denies headache, fevers, malaise, unintentional weight loss, skin rash, eye pain, sinus congestion and sinus pain, sore throat, dysphagia,  hemoptysis , dyspnea, wheezing, chest pain, palpitations,  orthopnea, edema, abdominal pain, nausea, melena, diarrhea, constipation, flank pain, dysuria, hematuria, urinary  Frequency, nocturia, numbness, tingling, seizures,  Focal weakness, Loss of consciousness,  Tremor, insomnia, depression, anxiety, and suicidal ideation.      Past Medical History:  Diagnosis Date  . Anxiety   . Arthritis    lower back  . Frequent headaches    stress  related  . Grade I diastolic dysfunction 6/96/7893   Echo June 2019  . High cholesterol   . Hyperlipidemia   . Hypertension   . Hyperthyroidism   . Migraines   . Mild concentric left ventricular hypertrophy 05/17/2018   Echo June 2019  . S/P hysterectomy 09/12/2014   Due to fibroids,   . Thyroid disease     Past Surgical History:  Procedure Laterality Date  . ABDOMINAL HYSTERECTOMY  1979  . SHOULDER ARTHROSCOPY WITH ROTATOR CUFF REPAIR Left 09/14/2019   Procedure: SHOULDER ARTHROSCOPY WITH ROTATOR CUFF REPAIR;  Surgeon: Lovell Sheehan, MD;  Location: [facility];  Service: Orthopedics;  Laterality: Left;  Marland Kitchen VAGINAL HYSTERECTOMY  1979    Family History  Problem Relation Age of Onset  . Hypertension Mother   . Alzheimer's disease Mother   . Cancer Father 66       prostate cancer  . Cancer Sister 54       breast cancer  . Breast cancer Sister 3  . Aneurysm Sister   . Diabetes Brother  type 2  . Heart disease Sister        cabg x 4, tobacco abuse  . Diabetes Sister        type 2  . Lung cancer Nephew     SOCIAL HX:  reports that she has never smoked. She has never used smokeless tobacco. She reports current alcohol use of about 1.0 standard drink of alcohol per week. She reports that she does not use drugs.  Current Outpatient Medications:  .  acyclovir (ZOVIRAX) 400 MG tablet, TAKE 1 TABLET DAILY (Patient taking differently: Take 400 mg by mouth as needed.), Disp: 90 tablet, Rfl: 3 .  amLODipine (NORVASC) 5 MG tablet, Take 1 tablet (5 mg total) by mouth at bedtime., Disp:  90 tablet, Rfl: 3 .  atorvastatin (LIPITOR) 40 MG tablet, Take 1 tablet (40 mg total) by mouth at bedtime., Disp: 90 tablet, Rfl: 3 .  chlorpheniramine-HYDROcodone (TUSSIONEX PENNKINETIC ER) 10-8 MG/5ML SUER, Take 5 mLs by mouth at bedtime as needed for cough., Disp: 115 mL, Rfl: 0 .  citalopram (CELEXA) 20 MG tablet, Take 1 tablet (20 mg total) by mouth daily., Disp: 90 tablet, Rfl: 1 .  estradiol (ESTRACE VAGINAL) 0.1 MG/GM vaginal cream, Apply 0.5mg  (pea-sized amount)  just inside the vaginal introitus with a finger-tip on Monday, Wednesday and Friday nights., Disp: 30 g, Rfl: 12 .  levothyroxine (SYNTHROID) 112 MCG tablet, Take 1 tablet (112 mcg total) by mouth daily before breakfast., Disp: 90 tablet, Rfl: 3  EXAM:   General impression: alert, cooperative and articulate.  No signs of being in distress  Lungs: speech is fluent sentence length suggests that patient is not short of breath and not punctuated by cough, sneezing or sniffing. Marland Kitchen   Psych: affect normal.  speech is articulate and non pressured .  Denies suicidal thoughts   ASSESSMENT AND PLAN:  Discussed the following assessment and plan:  Cough - Plan: chlorpheniramine-HYDROcodone (TUSSIONEX PENNKINETIC ER) 10-8 MG/5ML SUER  Bronchitis - Plan: chlorpheniramine-HYDROcodone (TUSSIONEX PENNKINETIC ER) 10-8 MG/5ML SUER  Upper respiratory tract infection, unspecified type - Plan: chlorpheniramine-HYDROcodone (TUSSIONEX PENNKINETIC ER) 10-8 MG/5ML SUER  Chronic cough  Chronic cough She has been treated multiple times since November for infectious causes of cough without improvement.  Agree with continued trial of omeprazole but recommending use of 20 mg dose bid to manage nighttime symptoms  Continue cough suppressants for one more week to resolve any irritation .  Follow up 2 weeks in person     I discussed the assessment and treatment plan with the patient. The patient was provided an opportunity to ask questions and all were  answered. The patient agreed with the plan and demonstrated an understanding of the instructions.   The patient was advised to call back or seek an in-person evaluation if the symptoms worsen or if the condition fails to improve as anticipated.  I provided 22   minutes of non-face-to-face time during this encounter.   Crecencio Mc, MD

## 2020-12-30 NOTE — Patient Instructions (Signed)
I agree that your cough is likely due to GERD given the way it has responded to prilosec  Increase omeprazole to twice daily on empty stomach  Consider sleeping on a wedge to elevate torso  continue cough suppression (benzonatate and tussionex refilled)  Follow up 2 weeks  In office   For your blood pressure:  Please obtain a few home readings between now and follow up     Do them at rest while seated,  Arm at chest hight (use table or counter)

## 2020-12-30 NOTE — Assessment & Plan Note (Signed)
She has been treated multiple times since November for infectious causes of cough without improvement.  Agree with continued trial of omeprazole but recommending use of 20 mg dose bid to manage nighttime symptoms  Continue cough suppressants for one more week to resolve any irritation .  Follow up 2 weeks in person

## 2021-01-07 ENCOUNTER — Other Ambulatory Visit: Payer: Self-pay

## 2021-01-07 DIAGNOSIS — E785 Hyperlipidemia, unspecified: Secondary | ICD-10-CM

## 2021-01-07 MED ORDER — ATORVASTATIN CALCIUM 40 MG PO TABS: 40.0000 mg | ORAL_TABLET | Freq: Every day | ORAL | 0 refills | Status: DC

## 2021-01-07 NOTE — Telephone Encounter (Signed)
Medication was last refilled by a different provider not in our office. Is it okay to refill?

## 2021-01-07 NOTE — Telephone Encounter (Signed)
Refill for 30 days only. LAB  VISIT NEEDED prior to any more refills TO CHECK LIVER ENZYMES

## 2021-01-17 ENCOUNTER — Other Ambulatory Visit: Payer: Self-pay

## 2021-01-17 ENCOUNTER — Encounter: Payer: Self-pay | Admitting: Internal Medicine

## 2021-01-17 ENCOUNTER — Telehealth: Payer: Self-pay | Admitting: Internal Medicine

## 2021-01-17 ENCOUNTER — Ambulatory Visit: Payer: 59 | Admitting: Internal Medicine

## 2021-01-17 VITALS — BP 155/88 | HR 78 | Temp 98.4°F | Ht 65.98 in | Wt 227.8 lb

## 2021-01-17 DIAGNOSIS — E669 Obesity, unspecified: Secondary | ICD-10-CM

## 2021-01-17 DIAGNOSIS — E559 Vitamin D deficiency, unspecified: Secondary | ICD-10-CM

## 2021-01-17 DIAGNOSIS — I10 Essential (primary) hypertension: Secondary | ICD-10-CM

## 2021-01-17 DIAGNOSIS — E785 Hyperlipidemia, unspecified: Secondary | ICD-10-CM

## 2021-01-17 DIAGNOSIS — B009 Herpesviral infection, unspecified: Secondary | ICD-10-CM | POA: Diagnosis not present

## 2021-01-17 DIAGNOSIS — E890 Postprocedural hypothyroidism: Secondary | ICD-10-CM

## 2021-01-17 DIAGNOSIS — M25561 Pain in right knee: Secondary | ICD-10-CM

## 2021-01-17 DIAGNOSIS — R059 Cough, unspecified: Secondary | ICD-10-CM

## 2021-01-17 DIAGNOSIS — F411 Generalized anxiety disorder: Secondary | ICD-10-CM

## 2021-01-17 DIAGNOSIS — G8929 Other chronic pain: Secondary | ICD-10-CM

## 2021-01-17 LAB — COMPREHENSIVE METABOLIC PANEL
ALT: 16 U/L (ref 0–35)
AST: 16 U/L (ref 0–37)
Albumin: 4.3 g/dL (ref 3.5–5.2)
Alkaline Phosphatase: 68 U/L (ref 39–117)
BUN: 12 mg/dL (ref 6–23)
CO2: 32 mEq/L (ref 19–32)
Calcium: 9.4 mg/dL (ref 8.4–10.5)
Chloride: 99 mEq/L (ref 96–112)
Creatinine, Ser: 0.67 mg/dL (ref 0.40–1.20)
GFR: 86.59 mL/min (ref >60.00)
Glucose, Bld: 87 mg/dL (ref 70–99)
Potassium: 3.9 mEq/L (ref 3.5–5.1)
Sodium: 139 mEq/L (ref 135–145)
Total Bilirubin: 1.1 mg/dL (ref 0.2–1.2)
Total Protein: 7.1 g/dL (ref 6.0–8.3)

## 2021-01-17 LAB — LIPID PANEL
Cholesterol: 183 mg/dL (ref 0–200)
HDL: 44.7 mg/dL (ref >39.00)
LDL Cholesterol: 101 mg/dL — ABNORMAL HIGH (ref 0–99)
NonHDL: 138.12
Total CHOL/HDL Ratio: 4
Triglycerides: 188 mg/dL — ABNORMAL HIGH (ref 0.0–149.0)
VLDL: 37.6 mg/dL (ref 0.0–40.0)

## 2021-01-17 LAB — TSH: TSH: 3.84 u[IU]/mL (ref 0.35–4.50)

## 2021-01-17 LAB — VITAMIN D 25 HYDROXY (VIT D DEFICIENCY, FRACTURES): VITD: 22.6 ng/mL — ABNORMAL LOW (ref 30.00–100.00)

## 2021-01-17 MED ORDER — ACYCLOVIR 400 MG PO TABS: 400.0000 mg | ORAL_TABLET | Freq: Every day | ORAL | 3 refills | Status: DC

## 2021-01-17 MED ORDER — CITALOPRAM HYDROBROMIDE 20 MG PO TABS: 20.0000 mg | ORAL_TABLET | Freq: Every day | ORAL | 1 refills | Status: DC

## 2021-01-17 MED ORDER — ESTRADIOL 0.1 MG/GM VA CREA: TOPICAL_CREAM | VAGINAL | 12 refills | Status: DC

## 2021-01-17 MED ORDER — ATORVASTATIN CALCIUM 40 MG PO TABS: 40.0000 mg | ORAL_TABLET | Freq: Every day | ORAL | 1 refills | Status: DC

## 2021-01-17 MED ORDER — ATORVASTATIN CALCIUM 40 MG PO TABS
40.0000 mg | ORAL_TABLET | Freq: Every day | ORAL | 1 refills | Status: DC
Start: 2021-01-17 — End: 2021-01-17

## 2021-01-17 MED ORDER — LEVOTHYROXINE SODIUM 112 MCG PO TABS: 112.0000 ug | ORAL_TABLET | Freq: Every day | ORAL | 3 refills | Status: DC

## 2021-01-17 MED ORDER — AMLODIPINE BESYLATE 5 MG PO TABS
5.0000 mg | ORAL_TABLET | Freq: Every day | ORAL | 3 refills | Status: DC
Start: 2021-01-17 — End: 2022-01-04

## 2021-01-17 NOTE — Telephone Encounter (Signed)
Patient called in stated that [facility] does not take her new insurance so she need all her medication sent to [facility]

## 2021-01-17 NOTE — Patient Instructions (Addendum)
  You can add up to 2000 mg of acetominophen (tylenol) every day safely  In divided doses (500 mg every 6 hours  Or 1000 mg every 12 hours.)  For weight loss   Optavia  Diet  Is much better for cholesterol than KETO diet  Phentermine is NOT advised at your age      Try taking a dose of metamucil,  Fibercon, ,  Citrucel, benefiber or miralax 30 minutes  prior to biggest meal (to increase satiety)

## 2021-01-17 NOTE — Progress Notes (Signed)
Subjective:  Patient ID: Julie Perez, female    DOB: [birth year]  Age: 75 y.o. MRN: [id number]  CC: The primary encounter diagnosis was Hypothyroidism, postradioiodine therapy. Diagnoses of Vitamin D deficiency, HSV-2 (herpes simplex virus 2) infection, Hyperlipidemia LDL goal <130, Obesity (BMI 30.0-34.9), Essential hypertension, Cough, and Chronic pain of right knee were also pertinent to this visit.  HPI Julie Perez presents for follow up on cough   This visit occurred during the SARS-CoV-2 public health emergency.  Safety protocols were in place, including screening questions prior to the visit, additional usage of staff PPE, and extensive cleaning of exam room while observing appropriate contact time as indicated for disinfecting solutions.   Patient was treated for bronchitis recently in the setting of chronic cough which had already improved with empiric treatment of GERD.  She states that her cough has entirely resolved with continued use of anti reflux measures.   She continues to have left knee pain that has improved since her fall,  But  discourages her from exercising regularly and so she has had limited success with losing weight .  She defers ortho evaluation for now.  She also notes dAily fatigue despite adequate rest ,  And requests thyroid check.  Takes her medication correctly.        Outpatient Medications Prior to Visit  Medication Sig Dispense Refill  . acyclovir (ZOVIRAX) 400 MG tablet TAKE 1 TABLET DAILY (Patient taking differently: Take 400 mg by mouth as needed.) 90 tablet 3  . amLODipine (NORVASC) 5 MG tablet Take 1 tablet (5 mg total) by mouth at bedtime. 90 tablet 3  . atorvastatin (LIPITOR) 40 MG tablet Take 1 tablet (40 mg total) by mouth at bedtime. 30 tablet 0  . chlorpheniramine-HYDROcodone (TUSSIONEX PENNKINETIC ER) 10-8 MG/5ML SUER Take 5 mLs by mouth at bedtime as needed for cough. 115 mL 0  . citalopram (CELEXA) 20 MG tablet Take 1 tablet (20 mg  total) by mouth daily. 90 tablet 1  . estradiol (ESTRACE VAGINAL) 0.1 MG/GM vaginal cream Apply 0.5mg  (pea-sized amount)  just inside the vaginal introitus with a finger-tip on Monday, Wednesday and Friday nights. 30 g 12  . levothyroxine (SYNTHROID) 112 MCG tablet Take 1 tablet (112 mcg total) by mouth daily before breakfast. 90 tablet 3  . benzonatate (TESSALON) 100 MG capsule Take 100-200 mg by mouth every 8 (eight) hours as needed. (Patient not taking: Reported on 01/17/2021)     No facility-administered medications prior to visit.    Review of Systems;  Patient denies headache, fevers, malaise, unintentional weight loss, skin rash, eye pain, sinus congestion and sinus pain, sore throat, dysphagia,  hemoptysis , cough, dyspnea, wheezing, chest pain, palpitations, orthopnea, edema, abdominal pain, nausea, melena, diarrhea, constipation, flank pain, dysuria, hematuria, urinary  Frequency, nocturia, numbness, tingling, seizures,  Focal weakness, Loss of consciousness,  Tremor, insomnia, depression, anxiety, and suicidal ideation.      Objective:  BP (!) 155/88 (BP Location: Left Arm, Patient Position: Sitting)   Pulse 78   Temp 98.4 F (36.9 C)   Ht 5' 5.98" (1.676 m)   Wt 227 lb 12.8 oz (103.3 kg)   SpO2 95%   BMI 36.79 kg/m   BP Readings from Last 3 Encounters:  01/17/21 (!) 155/88  11/02/20 140/78  09/25/20 132/76    Wt Readings from Last 3 Encounters:  01/17/21 227 lb 12.8 oz (103.3 kg)  12/30/20 228 lb 6.4 oz (103.6 kg)  11/12/20 225 lb (102.1 kg)  General appearance: alert, cooperative and appears stated age Ears: normal TM's and external ear canals both ears Throat: lips, mucosa, and tongue normal; teeth and gums normal Neck: no adenopathy, no carotid bruit, supple, symmetrical, trachea midline and thyroid not enlarged, symmetric, no tenderness/mass/nodules Back: symmetric, no curvature. ROM normal. No CVA tenderness. Lungs: clear to auscultation bilaterally Heart:  regular rate and rhythm, S1, S2 normal, no murmur, click, rub or gallop Abdomen: soft, non-tender; bowel sounds normal; no masses,  no organomegaly Pulses: 2+ and symmetric Skin: Skin color, texture, turgor normal. No rashes or lesions Lymph nodes: Cervical, supraclavicular, and axillary nodes normal.  Lab Results  Component Value Date   HGBA1C 5.4 09/01/2019   HGBA1C 5.5 01/04/2017    Lab Results  Component Value Date   CREATININE 0.67 01/17/2021   CREATININE 0.75 06/11/2020   CREATININE 0.74 12/15/2019    Lab Results  Component Value Date   WBC 6.2 06/11/2020   HGB 14.2 06/11/2020   HCT 42.0 06/11/2020   PLT 215 06/11/2020   GLUCOSE 87 01/17/2021   CHOL 183 01/17/2021   TRIG 188.0 (H) 01/17/2021   HDL 44.70 01/17/2021   LDLDIRECT 113.0 05/20/2016   LDLCALC 101 (H) 01/17/2021   ALT 16 01/17/2021   AST 16 01/17/2021   NA 139 01/17/2021   K 3.9 01/17/2021   CL 99 01/17/2021   CREATININE 0.67 01/17/2021   BUN 12 01/17/2021   CO2 32 01/17/2021   TSH 3.84 01/17/2021   HGBA1C 5.4 09/01/2019    DG Chest 2 View  Result Date: 11/12/2020 CLINICAL DATA:  Cough for 3 weeks, negative for COVID-19 EXAM: CHEST - 2 VIEW COMPARISON:  03/18/2015 FINDINGS: The heart size and mediastinal contours are within normal limits. Both lungs are clear. The visualized skeletal structures are unremarkable. IMPRESSION: No active cardiopulmonary disease. Electronically Signed   By: Randa Ngo M.D.   On: 11/12/2020 15:53    Assessment & Plan:   Problem List Items Addressed This Visit      Unprioritized   Vitamin D deficiency    Recurrent.  Will like need megadose every winter.       Relevant Orders   VITAMIN D 25 Hydroxy (Vit-D Deficiency, Fractures) (Completed)   Obesity (BMI 30.0-34.9)    Reviewed the safety issues around use of phentermine and recommended use of BFL to increase satiety at dinner time. Discussed Optavia diet as  A healthier alternative to KETO diet.         Hypothyroidism, postradioiodine therapy - Primary    Thyroid level is in range.  No changes today  Lab Results  Component Value Date   TSH 3.84 01/17/2021         Relevant Orders   TSH (Completed)   Hyperlipidemia LDL goal <130   Relevant Orders   Lipid panel (Completed)   Comprehensive metabolic panel (Completed)   HSV-2 (herpes simplex virus 2) infection   Essential hypertension    she reports compliance with medication regimen  but the last two office readings have been elevated .  She has been using NSAIDs intermittently for knee pain . Marland Kitchen  Discussed goal of 130/80 for patients over 70)  to preserve renal function.  She has been asked to suspend NSAIDS and check her  BP  Once daily at home for one week and   submit readings for evaluation. Renal function, electrolytes and screen for proteinuria are all normal.  Lab Results  Component Value Date   CREATININE 0.67 01/17/2021  Lab Results  Component Value Date   NA 139 01/17/2021   K 3.9 01/17/2021   CL 99 01/17/2021   CO2 32 01/17/2021   Lab Results  Component Value Date   LABMICR See below: 02/14/2020   LABMICR See below: 01/09/2020          Cough    She has been treated multiple times since November for infectious causes of cough without improvement.  Cough has now resolved with use ot OTC omeprazole bid and one week of cough suppressing medication to resolve inflammation.  The risks of long term PPI use for acid suppression in patients without documented Barretts esophagus were discussed, including the possibility of osteoporosis, iron , B12 and magnesium deficiencies,  CKD, and dementia. Suggested trial of pepcid 20 mg daily and if cough returns ,  To resume daily PPI and  referral for EGD.        Chronic pain of right knee    Attributed to OA.  Chronic,  Trial of tylenol recommended          I have discontinued Garvin Fila. [place]'s chlorpheniramine-HYDROcodone, atorvastatin, and benzonatate. I am also having  her start on ergocalciferol.  Meds ordered this encounter  Medications  . DISCONTD: atorvastatin (LIPITOR) 40 MG tablet    Sig: Take 1 tablet (40 mg total) by mouth at bedtime.    Dispense:  90 tablet    Refill:  1  . ergocalciferol (DRISDOL) 1.25 MG (50000 UT) capsule    Sig: Take 1 capsule (50,000 Units total) by mouth once a week.    Dispense:  12 capsule    Refill:  0    I provided  30 minutes of  face-to-face time during this encounter reviewing patient's current problems and past surgeries, labs and imaging studies, providing counseling on the above mentioned problems , and coordination  of care .   Medications Discontinued During This Encounter  Medication Reason  . benzonatate (TESSALON) 100 MG capsule Error  . atorvastatin (LIPITOR) 40 MG tablet Reorder  . chlorpheniramine-HYDROcodone (TUSSIONEX PENNKINETIC ER) 10-8 MG/5ML SUER     Follow-up: No follow-ups on file.   Crecencio Mc, MD

## 2021-01-18 DIAGNOSIS — M25561 Pain in right knee: Secondary | ICD-10-CM | POA: Insufficient documentation

## 2021-01-18 DIAGNOSIS — G8929 Other chronic pain: Secondary | ICD-10-CM | POA: Insufficient documentation

## 2021-01-18 MED ORDER — ERGOCALCIFEROL 1.25 MG (50000 UT) PO CAPS: 50000.0000 [IU] | ORAL_CAPSULE | ORAL | 0 refills | Status: DC

## 2021-01-18 NOTE — Assessment & Plan Note (Signed)
Attributed to OA.  Chronic,  Trial of tylenol recommended

## 2021-01-18 NOTE — Assessment & Plan Note (Signed)
Recurrent.  Will like need megadose every winter.

## 2021-01-18 NOTE — Assessment & Plan Note (Addendum)
Reviewed the safety issues around use of phentermine and recommended use of BFL to increase satiety at dinner time. Discussed Optavia diet as  A healthier alternative to KETO diet.

## 2021-01-18 NOTE — Assessment & Plan Note (Addendum)
she reports compliance with medication regimen  but the last two office readings have been elevated .  She has been using NSAIDs intermittently for knee pain . Marland Kitchen  Discussed goal of 130/80 for patients over 70)  to preserve renal function.  She has been asked to suspend NSAIDS and check her  BP  Once daily at home for one week and   submit readings for evaluation. Renal function, electrolytes and screen for proteinuria are all normal.  Lab Results  Component Value Date   CREATININE 0.67 01/17/2021   Lab Results  Component Value Date   NA 139 01/17/2021   K 3.9 01/17/2021   CL 99 01/17/2021   CO2 32 01/17/2021   Lab Results  Component Value Date   LABMICR See below: 02/14/2020   LABMICR See below: 01/09/2020

## 2021-01-18 NOTE — Assessment & Plan Note (Addendum)
Thyroid level is in range.  No changes today  Lab Results  Component Value Date   TSH 3.84 01/17/2021

## 2021-01-18 NOTE — Assessment & Plan Note (Addendum)
She has been treated multiple times since November for infectious causes of cough without improvement.  Cough has now resolved with use ot OTC omeprazole bid and one week of cough suppressing medication to resolve inflammation.  The risks of long term PPI use for acid suppression in patients without documented Barretts esophagus were discussed, including the possibility of osteoporosis, iron , B12 and magnesium deficiencies,  CKD, and dementia. Suggested trial of pepcid 20 mg daily and if cough returns ,  To resume daily PPI and  referral for EGD.

## 2021-01-20 MED ORDER — ERGOCALCIFEROL 1.25 MG (50000 UT) PO CAPS: 50000.0000 [IU] | ORAL_CAPSULE | ORAL | 0 refills | Status: DC

## 2021-01-20 NOTE — Telephone Encounter (Signed)
Vitamin d has been resent to correct pharmacy. Is it okay to send in a rx for omeprazole? Pt is currently taking the OTC.

## 2021-01-20 NOTE — Telephone Encounter (Signed)
Patient called back and stated that [facility] D [facility] not [facility] . She also stated that she would like a prescription for the generic Prilosec sent to the pharmacy instead of getting it over the counter.

## 2021-01-20 NOTE — Addendum Note (Signed)
Addended by: Adair Laundry on: 01/20/2021 04:46 PM   Modules accepted: Orders

## 2021-01-20 NOTE — Telephone Encounter (Signed)
Yes you can send in rx for prlosec

## 2021-01-21 MED ORDER — OMEPRAZOLE 20 MG PO CPDR: 20.0000 mg | DELAYED_RELEASE_CAPSULE | Freq: Every day | ORAL | 1 refills | Status: DC

## 2021-01-21 NOTE — Addendum Note (Signed)
Addended by: Crecencio Mc on: 01/21/2021 09:40 AM   Modules accepted: Orders

## 2021-01-21 NOTE — Telephone Encounter (Signed)
[facility]

## 2021-02-12 ENCOUNTER — Telehealth: Payer: Self-pay | Admitting: Urology

## 2021-02-12 NOTE — Telephone Encounter (Signed)
Please call Julie Perez and have her reschedule her missed appointment from December for microscopic hematuria.  It is important that we continue to screen for the blood in the urine because if it continues to persist we need to repeat her CT scan and cystoscopy on a periodic basis to make sure no kidney or bladder cancer is best.

## 2021-02-14 NOTE — Telephone Encounter (Signed)
.  left message to have patient return my call.  

## 2021-02-17 NOTE — Telephone Encounter (Signed)
.  left message to have patient return my call.  

## 2021-02-18 NOTE — Telephone Encounter (Signed)
.  left message to have patient return my call.  Julie Perez would you like me to mail letter?

## 2021-02-18 NOTE — Telephone Encounter (Signed)
Yes, please.

## 2021-02-20 NOTE — Telephone Encounter (Signed)
letter mailed to patients home address to reschedule

## 2021-03-12 ENCOUNTER — Other Ambulatory Visit: Payer: Self-pay | Admitting: Internal Medicine

## 2021-03-12 DIAGNOSIS — F411 Generalized anxiety disorder: Secondary | ICD-10-CM

## 2021-04-12 ENCOUNTER — Other Ambulatory Visit: Payer: Self-pay | Admitting: Internal Medicine

## 2021-04-16 ENCOUNTER — Telehealth: Payer: Self-pay | Admitting: Internal Medicine

## 2021-04-16 NOTE — Telephone Encounter (Signed)
Patient has an appointment on 04/22/21 with [facility]. She has rt leg pain for over a month.

## 2021-04-17 ENCOUNTER — Ambulatory Visit: Payer: Medicare HMO

## 2021-04-22 ENCOUNTER — Other Ambulatory Visit: Payer: Self-pay

## 2021-04-22 ENCOUNTER — Encounter: Payer: Self-pay | Admitting: Adult Health

## 2021-04-22 ENCOUNTER — Ambulatory Visit: Payer: Medicare Other | Admitting: Adult Health

## 2021-04-22 VITALS — BP 124/82 | HR 64 | Temp 98.1°F | Ht 65.98 in | Wt 228.6 lb

## 2021-04-22 DIAGNOSIS — M79604 Pain in right leg: Secondary | ICD-10-CM

## 2021-04-22 DIAGNOSIS — M7121 Synovial cyst of popliteal space [Baker], right knee: Secondary | ICD-10-CM | POA: Diagnosis not present

## 2021-04-22 MED ORDER — LIDOCAINE 5 % EX PTCH: 1.0000 | MEDICATED_PATCH | CUTANEOUS | 0 refills | Status: DC

## 2021-04-22 NOTE — Patient Instructions (Addendum)
Lidocaine dermal patch What is this medicine? LIDOCAINE (LYE doe kane) causes loss of feeling in the skin and surrounding area. The medicine helps treat pain, including nerve pain. This medicine may be used for other purposes; ask your health care provider or pharmacist if you have questions. COMMON BRAND NAME(S): Aspercreme with Lidocaine, Blue-Emu, DERMALID, GEN7T, Lidocare, Lidoderm, LidoReal-30, Salonpas Lidocaine, ZTlido What should I tell my health care provider before I take this medicine? They need to know if you have any of these conditions:  heart disease  history of irregular heart beat  liver disease  skin conditions or sensitivity  skin infection  an unusual or allergic reaction to lidocaine, parabens, other medicines, foods, dyes, or preservatives  pregnant or trying to get pregnant  breast-feeding How should I use this medicine? This medicine is for external use only. Follow the directions on the prescription label or package. Talk to your pediatrician regarding the use of this medicine in children. Special care may be needed. Overdosage: If you think you have taken too much of this medicine contact a poison control center or emergency room at once. NOTE: This medicine is only for you. Do not share this medicine with others. What if I miss a dose? If you miss a dose, take it as soon as you can. If it is almost time for your next dose, take only that dose. Do not take double or extra doses. What may interact with this medicine? Do not take this medicine with any of the following medications:  certain medicines for irregular heart beat  MAOIs like Carbex, Eldepryl, Marplan, Nardil, and Parnate This medicine may also interact with the following medications:  other local anesthetics like pramoxine, tetracaine Do not use any other skin products on the affected area without asking your doctor or health care professional. This list may not describe all possible  interactions. Give your health care provider a list of all the medicines, herbs, non-prescription drugs, or dietary supplements you use. Also tell them if you smoke, drink alcohol, or use illegal drugs. Some items may interact with your medicine. What should I watch for while using this medicine? Tell your doctor or healthcare professional if your symptoms do not start to get better or if they get worse. Be careful to avoid injury while the area is numb from the medicine, and you are not aware of pain. If you are going to need surgery, a MRI, CT scan, or other procedure, tell your doctor that you are using this medicine. You may need to remove this patch before the procedure. Do not get this medicine in your eyes. If you do, rinse out with plenty of cool tap water. This medicine can make certain skin conditions worse. Only use it for conditions for which your doctor or health care professional has prescribed. What side effects may I notice from receiving this medicine? Side effects that you should report to your doctor or health care professional as soon as possible:  allergic reactions like skin rash, itching or hives, swelling of the face, lips, or tongue  breathing problems  chest pain or chest tightness  dizzines Side effects that usually do not require medical attention (report to your doctor or health care professional if they continue or are bothersome):  tingling, numbness at site where applied This list may not describe all possible side effects. Call your doctor for medical advice about side effects. You may report side effects to FDA at 1-800-FDA-1088. Where should I keep my medicine?  Keep out of the reach of children. See product for storage instructions. Each product may have different instructions. NOTE: This sheet is a summary. It may not cover all possible information. If you have questions about this medicine, talk to your doctor, pharmacist, or health care provider.  2021  Elsevier/Gold Standard (2017-08-11 22:50:48) Acute Knee Pain, Adult Many things can cause knee pain. Sometimes, knee pain is sudden (acute) and may be caused by damage, swelling, or irritation of the muscles and tissues that support your knee. The pain often goes away on its own with time and rest. If the pain does not go away, tests may be done to find out what is causing the pain. Follow these instructions at home: If you have a knee sleeve or brace:  Wear the knee sleeve or brace as told by your doctor. Take it off only as told by your doctor.  Loosen it if your toes: ? Tingle. ? Become numb. ? Turn cold and blue.  Keep it clean.  If the knee sleeve or brace is not waterproof: ? Do not let it get wet. ? Cover it with a watertight covering when you take a bath or shower.   Activity  Rest your knee.  Do not do things that cause pain or make pain worse.  Avoid activities where both feet leave the ground at the same time (high-impact activities). Examples are running, jumping rope, and doing jumping jacks.  Work with a physical therapist to make a safe exercise program, as told by your doctor. Managing pain, stiffness, and swelling  If told, put ice on the knee. To do this: ? If you have a removable knee sleeve or brace, take it off as told by your doctor. ? Put ice in a plastic bag. ? Place a towel between your skin and the bag. ? Leave the ice on for 20 minutes, 2-3 times a day. ? Take off the ice if your skin turns bright red. This is very important. If you cannot feel pain, heat, or cold, you have a greater risk of damage to the area.  If told, use an elastic bandage to put pressure (compression) on your injured knee.  Raise your knee above the level of your heart while you are sitting or lying down.  Sleep with a pillow under your knee.   General instructions  Take over-the-counter and prescription medicines only as told by your doctor.  Do not smoke or use any  products that contain nicotine or tobacco. If you need help quitting, ask your doctor.  If you are overweight, work with your doctor and a food expert (dietitian) to set goals to lose weight. Being overweight can make your knee hurt more.  Watch for any changes in your symptoms.  Keep all follow-up visits. Contact a doctor if:  The knee pain does not stop.  The knee pain changes or gets worse.  You have a fever along with knee pain.  Your knee is red or feels warm when you touch it.  Your knee gives out or locks up. Get help right away if:  Your knee swells, and the swelling gets worse.  You cannot move your knee.  You have very bad knee pain that does not get better with pain medicine. Summary  Many things can cause knee pain. The pain often goes away on its own with time and rest.  Your doctor may do tests to find out the cause of the pain.  Watch for any  changes in your symptoms. Relieve your pain with rest, medicines, light activity, and use of ice.  Get help right away if you cannot move your knee or your knee pain is very bad. This information is not intended to replace advice given to you by your health care provider. Make sure you discuss any questions you have with your health care provider. Document Revised: 04/24/2020 Document Reviewed: 04/24/2020 Elsevier Patient Education  2021 [employer].

## 2021-04-22 NOTE — Progress Notes (Addendum)
Acute Office Visit  Subjective:    Patient ID: Julie Perez, female    DOB: 07/02/1946, 75 y.o.   MRN: [id number]  Chief Complaint  Patient presents with  . Follow-up    HPI Patient is in today for right leg pain, has pain lifting her right knee and pain. She has had no erythema, or warmth. She notices it more at night. She reports she has had this pain for around one month.Hurts worse with movement or after sitting.   She tried Tylenol and advil and this helps a little.    Notably She was seen 01/17/21  by PCP Crecencio Mc, MD for right knee pain and deferred orthopedic referral at that time. Trial of tylenol was advised and attributed to osteoarthritis.   Patient  denies any fever, body aches,chills, rash, chest pain, shortness of breath, nausea, vomiting, or diarrhea.  Denies dizziness, lightheadedness, pre syncopal or syncopal episodes.    Past Medical History:  Diagnosis Date  . Anxiety   . Arthritis    lower back  . Frequent headaches    stress  related  . Grade I diastolic dysfunction 9/62/2297   Echo June 2019  . High cholesterol   . Hyperlipidemia   . Hypertension   . Hyperthyroidism   . Migraines   . Mild concentric left ventricular hypertrophy 05/17/2018   Echo June 2019  . S/P hysterectomy 09/12/2014   Due to fibroids,   . Thyroid disease     Past Surgical History:  Procedure Laterality Date  . ABDOMINAL HYSTERECTOMY  1979  . SHOULDER ARTHROSCOPY WITH ROTATOR CUFF REPAIR Left 09/14/2019   Procedure: SHOULDER ARTHROSCOPY WITH ROTATOR CUFF REPAIR;  Surgeon: Lovell Sheehan, MD;  Location: [facility];  Service: Orthopedics;  Laterality: Left;  Marland Kitchen VAGINAL HYSTERECTOMY  1979    Family History  Problem Relation Age of Onset  . Hypertension Mother   . Alzheimer's disease Mother   . Cancer Father 27       prostate cancer  . Cancer Sister 10       breast cancer  . Breast cancer Sister 47  . Aneurysm Sister   . Diabetes Brother         type 2  . Heart disease Sister        cabg x 4, tobacco abuse  . Diabetes Sister        type 2  . Lung cancer Nephew     Social History   Socioeconomic History  . Marital status: Married    Spouse name: Rip Harbour  . Number of children: 2  . Years of education: Not on file  . Highest education level: 12th grade  Occupational History  . Occupation: Retired  Tobacco Use  . Smoking status: Never Smoker  . Smokeless tobacco: Never Used  . Tobacco comment: smoking cessation materials not required  Vaping Use  . Vaping Use: Never used  Substance and Sexual Activity  . Alcohol use: Yes    Alcohol/week: 1.0 standard drink    Types: 1 Glasses of wine per week    Comment: rare  . Drug use: No  . Sexual activity: Not Currently  Other Topics Concern  . Not on file  Social History Narrative   Pt lives alone. Husband lives out of state.   Social Determinants of Health   Financial Resource Strain: Not on file  Food Insecurity: Not on file  Transportation Needs: Not on file  Physical Activity: Not on file  Stress: Not on file  Social Connections: Not on file  Intimate Partner Violence: Not on file    Outpatient Medications Prior to Visit  Medication Sig Dispense Refill  . acyclovir (ZOVIRAX) 400 MG tablet Take 1 tablet (400 mg total) by mouth daily. 90 tablet 3  . amLODipine (NORVASC) 5 MG tablet Take 1 tablet (5 mg total) by mouth at bedtime. 90 tablet 3  . atorvastatin (LIPITOR) 40 MG tablet Take 1 tablet (40 mg total) by mouth at bedtime. 90 tablet 1  . citalopram (CELEXA) 20 MG tablet TAKE 1 TABLET DAILY 90 tablet 3  . estradiol (ESTRACE VAGINAL) 0.1 MG/GM vaginal cream Apply 0.5mg  (pea-sized amount)  just inside the vaginal introitus with a finger-tip on Monday, Wednesday and Friday nights. 30 g 12  . levothyroxine (SYNTHROID) 112 MCG tablet Take 1 tablet (112 mcg total) by mouth daily before breakfast. 90 tablet 3  . omeprazole (PRILOSEC) 20 MG capsule Take 1 capsule (20 mg  total) by mouth daily. 90 capsule 1  . Vitamin D, Ergocalciferol, (DRISDOL) 1.25 MG (50000 UNIT) CAPS capsule TAKE 1 CAPSULE BY MOUTH 1 TIME A WEEK 12 capsule 0   No facility-administered medications prior to visit.    No Known Allergies  Review of Systems  Constitutional: Negative.   HENT: Negative.   Respiratory: Negative.   Cardiovascular: Negative.   Gastrointestinal: Negative.   Genitourinary: Negative.   Musculoskeletal: Positive for arthralgias.  Skin: Negative.        Objective:     Today's Vitals   04/22/21 1021  BP: 124/82  Pulse: 64  Temp: 98.1 F (36.7 C)  SpO2: 97%  Weight: 228 lb 9.6 oz (103.7 kg)  Height: 5' 5.98" (1.676 m)  PainSc: 7   PainLoc: Leg   Body mass index is 36.91 kg/m. Vitals:   04/22/21 1021  Weight: 228 lb 9.6 oz (103.7 kg)  Height: 5' 5.98" (1.676 m)   Physical Exam Vitals reviewed.  Constitutional:      General: She is not in acute distress.    Appearance: She is well-developed. She is not diaphoretic.     Interventions: She is not intubated. HENT:     Head: Normocephalic and atraumatic.     Right Ear: External ear normal.     Left Ear: External ear normal.     Nose: Nose normal.     Mouth/Throat:     Pharynx: No oropharyngeal exudate.  Eyes:     General: Lids are normal. No scleral icterus.       Right eye: No discharge.        Left eye: No discharge.     Conjunctiva/sclera: Conjunctivae normal.     Right eye: Right conjunctiva is not injected. No exudate or hemorrhage.    Left eye: Left conjunctiva is not injected. No exudate or hemorrhage.    Pupils: Pupils are equal, round, and reactive to light.  Neck:     Thyroid: No thyroid mass or thyromegaly.     Vascular: Normal carotid pulses. No carotid bruit, hepatojugular reflux or JVD.     Trachea: Trachea and phonation normal. No tracheal tenderness or tracheal deviation.     Meningeal: Brudzinski's sign and Kernig's sign absent.  Cardiovascular:     Rate and Rhythm:  Normal rate and regular rhythm.     Pulses: Normal pulses.          Radial pulses are 2+ on the right side and 2+ on the left side.  Dorsalis pedis pulses are 2+ on the right side and 2+ on the left side.       Posterior tibial pulses are 2+ on the right side and 2+ on the left side.     Heart sounds: Normal heart sounds, S1 normal and S2 normal. Heart sounds not distant. No murmur heard. No friction rub. No gallop.   Pulmonary:     Effort: Pulmonary effort is normal. No tachypnea, bradypnea, accessory muscle usage or respiratory distress. She is not intubated.     Breath sounds: Normal breath sounds. No stridor. No wheezing, rhonchi or rales.  Chest:     Chest wall: No tenderness.  Breasts:     Right: No supraclavicular adenopathy.     Left: No supraclavicular adenopathy.    Abdominal:     General: Bowel sounds are normal. There is no distension or abdominal bruit.     Palpations: Abdomen is soft. There is no shifting dullness, fluid wave, hepatomegaly, splenomegaly, mass or pulsatile mass.     Tenderness: There is no abdominal tenderness. There is no guarding or rebound.     Hernia: No hernia is present.  Musculoskeletal:        General: No deformity. Normal range of motion.     Cervical back: Full passive range of motion without pain, normal range of motion and neck supple. No edema, erythema or rigidity. No spinous process tenderness or muscular tenderness. Normal range of motion.     Thoracic back: Normal.     Lumbar back: Normal.     Right knee: Tenderness (posterior knee, mild swelling noted questionable popliteal cyst, larger than left popliteal.) present.     Left knee: Normal.     Right lower leg: Normal.     Left lower leg: Normal.  Lymphadenopathy:     Head:     Right side of head: No submental, submandibular, tonsillar, preauricular, posterior auricular or occipital adenopathy.     Left side of head: No submental, submandibular, tonsillar, preauricular, posterior  auricular or occipital adenopathy.     Cervical: No cervical adenopathy.     Right cervical: No superficial, deep or posterior cervical adenopathy.    Left cervical: No superficial, deep or posterior cervical adenopathy.     Upper Body:     Right upper body: No supraclavicular or pectoral adenopathy.     Left upper body: No supraclavicular or pectoral adenopathy.  Skin:    General: Skin is warm and dry.     Coloration: Skin is not pale.     Findings: No abrasion, bruising, burn, ecchymosis, erythema, lesion, petechiae or rash.     Nails: There is no clubbing.  Neurological:     Mental Status: She is alert and oriented to person, place, and time.     GCS: GCS eye subscore is 4. GCS verbal subscore is 5. GCS motor subscore is 6.     Cranial Nerves: No cranial nerve deficit.     Sensory: No sensory deficit.     Motor: No weakness, tremor, atrophy, abnormal muscle tone or seizure activity.     Coordination: Coordination normal.     Gait: Gait normal.     Deep Tendon Reflexes: Reflexes are normal and symmetric. Reflexes normal. Babinski sign absent on the right side. Babinski sign absent on the left side.     Reflex Scores:      Tricep reflexes are 2+ on the right side and 2+ on the left side.  Bicep reflexes are 2+ on the right side and 2+ on the left side.      Brachioradialis reflexes are 2+ on the right side and 2+ on the left side.      Patellar reflexes are 2+ on the right side and 2+ on the left side.      Achilles reflexes are 2+ on the right side and 2+ on the left side. Psychiatric:        Speech: Speech normal.        Behavior: Behavior normal.        Thought Content: Thought content normal.        Judgment: Judgment normal.     BP 124/82   Pulse 64   Temp 98.1 F (36.7 C)   Ht 5' 5.98" (1.676 m)   Wt 228 lb 9.6 oz (103.7 kg)   SpO2 97%   BMI 36.91 kg/m  Wt Readings from Last 3 Encounters:  04/22/21 228 lb 9.6 oz (103.7 kg)  01/17/21 227 lb 12.8 oz (103.3 kg)   12/30/20 228 lb 6.4 oz (103.6 kg)    Health Maintenance Due  Topic Date Due  . Zoster Vaccines- Shingrix (1 of 2) Never done    There are no preventive care reminders to display for this patient.   Lab Results  Component Value Date   TSH 3.84 01/17/2021   Lab Results  Component Value Date   WBC 6.2 06/11/2020   HGB 14.2 06/11/2020   HCT 42.0 06/11/2020   MCV 89.0 06/11/2020   PLT 215 06/11/2020   Lab Results  Component Value Date   NA 139 01/17/2021   K 3.9 01/17/2021   CO2 32 01/17/2021   GLUCOSE 87 01/17/2021   BUN 12 01/17/2021   CREATININE 0.67 01/17/2021   BILITOT 1.1 01/17/2021   ALKPHOS 68 01/17/2021   AST 16 01/17/2021   ALT 16 01/17/2021   PROT 7.1 01/17/2021   ALBUMIN 4.3 01/17/2021   CALCIUM 9.4 01/17/2021   ANIONGAP 9 01/29/2017   GFR 86.59 01/17/2021   Lab Results  Component Value Date   CHOL 183 01/17/2021   Lab Results  Component Value Date   HDL 44.70 01/17/2021   Lab Results  Component Value Date   LDLCALC 101 (H) 01/17/2021   Lab Results  Component Value Date   TRIG 188.0 (H) 01/17/2021   Lab Results  Component Value Date   CHOLHDL 4 01/17/2021   Lab Results  Component Value Date   HGBA1C 5.4 09/01/2019       Assessment & Plan:   Problem List Items Addressed This Visit      Musculoskeletal and Integument   Popliteal cyst, 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    Relevant Medications   lidocaine (LIDODERM) 5 %   Other Relevant Orders   US Venous Img Lower Unilateral Right (DVT)     Other   Pain of right lower extremity - Primary   Relevant Medications   lidocaine (LIDODERM) 5 %     Will send for DVT ultrasound of knee suspect cyst popliteal.  She declines recommended knee  x ray and  orthopedics referral.  She does request lidocaine patch.    Meds ordered this encounter  Medications  . lidocaine (LIDODERM) 5 %    Sig: Place 1 patch onto the skin daily. Remove & Discard patch within 12 hours or as directed by MD     Dispense:  30 patch    Refill:  0   Return in about  3 weeks (around 05/13/2021), or if symptoms worsen or fail to improve, for Go to Emergency room/ urgent care if worse, at any time for any worsening symptoms. Marcille Buffy, FNP

## 2021-04-23 ENCOUNTER — Other Ambulatory Visit: Payer: Self-pay

## 2021-04-23 ENCOUNTER — Other Ambulatory Visit: Payer: Self-pay | Admitting: Adult Health

## 2021-04-23 ENCOUNTER — Telehealth: Payer: Self-pay

## 2021-04-23 ENCOUNTER — Ambulatory Visit
Admission: RE | Admit: 2021-04-23 | Discharge: 2021-04-23 | Disposition: A | Discharge status: Home / Self Care | Payer: Medicare Other | Source: Ambulatory Visit | Attending: Adult Health | Admitting: Adult Health

## 2021-04-23 DIAGNOSIS — M7121 Synovial cyst of popliteal space [Baker], right knee: Secondary | ICD-10-CM

## 2021-04-23 DIAGNOSIS — M7989 Other specified soft tissue disorders: Secondary | ICD-10-CM

## 2021-04-23 DIAGNOSIS — M79604 Pain in right leg: Secondary | ICD-10-CM

## 2021-04-23 DIAGNOSIS — M79661 Pain in right lower leg: Secondary | ICD-10-CM | POA: Diagnosis not present

## 2021-04-23 NOTE — Telephone Encounter (Signed)
Submitted a prior authorization for Lidocaine 5% patches. KEY: [id number]

## 2021-04-23 NOTE — Telephone Encounter (Signed)
Please advise 

## 2021-04-23 NOTE — Telephone Encounter (Signed)
Ultrasound DVT was ordered stat on 04/22/21 please call patient to follow up.

## 2021-04-23 NOTE — Telephone Encounter (Signed)
Pt called and asked about referral for ultrasound on her right leg. Please advise.

## 2021-04-23 NOTE — Telephone Encounter (Signed)
Called patient to let her know referral was placed. Patient wants to know when she can she go to get it done?

## 2021-04-23 NOTE — Addendum Note (Signed)
Addended by: Dia Crawford on: 04/23/2021 05:34 PM   Modules accepted: Orders

## 2021-04-23 NOTE — Telephone Encounter (Signed)
She should have already been called to schedule, my order is in place. This would be follow up with [place] in referrals to schedule.

## 2021-04-24 ENCOUNTER — Telehealth: Payer: Self-pay | Admitting: Internal Medicine

## 2021-04-24 ENCOUNTER — Other Ambulatory Visit: Payer: Self-pay | Admitting: Adult Health

## 2021-04-24 DIAGNOSIS — M79604 Pain in right leg: Secondary | ICD-10-CM

## 2021-04-24 DIAGNOSIS — M25561 Pain in right knee: Secondary | ICD-10-CM

## 2021-04-24 NOTE — Telephone Encounter (Signed)
Called patient with results.  

## 2021-04-24 NOTE — Progress Notes (Signed)
Referral placed to orthopedics she should hear from referral in 3 weeks, since she was negative for DVT and cyst in her right leg, I recommend a x ray of her right knee and lower leg.  We do not have x ray in office at this time, so she could go to [facility] walk in for these x rays ordered from 8 am to 5 pm it is open Monday to Friday and located on [address].

## 2021-04-24 NOTE — Progress Notes (Signed)
Orders Placed This Encounter  Procedures  . DG Knee 3 Views Right    Order Specific Question:   Reason for Exam (SYMPTOM  OR DIAGNOSIS REQUIRED)    Answer:   knee pain, right lower extremity pain.    Order Specific Question:   Preferred imaging location?    Answer:   [facility]  . DG Tibia/Fibula Right    Order Specific Question:   Reason for Exam (SYMPTOM  OR DIAGNOSIS REQUIRED)    Answer:   right lower leg pain    Order Specific Question:   Preferred imaging location?    Answer:   [facility]  . Ambulatory referral to Orthopedic Surgery    Referral Priority:   Routine    Referral Type:   Surgical    Referral Reason:   Specialty Services Required    Referred to Provider:   Corky Mull, MD    Requested Specialty:   Orthopedic Surgery    Number of Visits Requested:   1

## 2021-04-24 NOTE — Telephone Encounter (Signed)
Patient called in wanted to know where she is to go for imaging on Friday 6-3

## 2021-04-24 NOTE — Telephone Encounter (Signed)
Pt called to get US results  

## 2021-04-25 ENCOUNTER — Ambulatory Visit
Admission: RE | Admit: 2021-04-25 | Discharge: 2021-04-25 | Disposition: A | Discharge status: Home / Self Care | Payer: Medicare Other | Source: Ambulatory Visit | Attending: Adult Health | Admitting: Adult Health

## 2021-04-25 ENCOUNTER — Other Ambulatory Visit: Payer: Self-pay

## 2021-04-25 ENCOUNTER — Ambulatory Visit
Admission: RE | Admit: 2021-04-25 | Discharge: 2021-04-25 | Disposition: A | Discharge status: Home / Self Care | Payer: Medicare Other | Attending: Adult Health | Admitting: Adult Health

## 2021-04-25 DIAGNOSIS — M1711 Unilateral primary osteoarthritis, right knee: Secondary | ICD-10-CM | POA: Diagnosis not present

## 2021-04-25 DIAGNOSIS — M25561 Pain in right knee: Secondary | ICD-10-CM | POA: Diagnosis not present

## 2021-04-25 DIAGNOSIS — M79604 Pain in right leg: Secondary | ICD-10-CM | POA: Insufficient documentation

## 2021-04-25 DIAGNOSIS — M79661 Pain in right lower leg: Secondary | ICD-10-CM | POA: Diagnosis not present

## 2021-04-28 NOTE — Progress Notes (Signed)
Right knee x ray mild osteoarthritis of right knee, also has right knee has some fluid on it. /recommend keeping orthopedic referral.  IMPRESSION: Mild osteoarthritis of the right knee most prominently involving the medial tibiofemoral compartment. Small knee joint effusion.

## 2021-04-28 NOTE — Progress Notes (Signed)
Tibia and fibula x ray within normal limits there is some soft tissue swelling in the front of leg. Keep orthopedics referral is advised.

## 2021-04-30 DIAGNOSIS — M25461 Effusion, right knee: Secondary | ICD-10-CM | POA: Diagnosis not present

## 2021-04-30 DIAGNOSIS — M25561 Pain in right knee: Secondary | ICD-10-CM | POA: Diagnosis not present

## 2021-04-30 DIAGNOSIS — M7121 Synovial cyst of popliteal space [Baker], right knee: Secondary | ICD-10-CM | POA: Diagnosis not present

## 2021-05-06 DIAGNOSIS — M67863 Other specified disorders of tendon, right knee: Secondary | ICD-10-CM | POA: Diagnosis not present

## 2021-05-06 DIAGNOSIS — M76891 Other specified enthesopathies of right lower limb, excluding foot: Secondary | ICD-10-CM | POA: Diagnosis not present

## 2021-05-06 DIAGNOSIS — M23631 Other spontaneous disruption of medial collateral ligament of right knee: Secondary | ICD-10-CM | POA: Diagnosis not present

## 2021-05-06 DIAGNOSIS — M23351 Other meniscus derangements, posterior horn of lateral meniscus, right knee: Secondary | ICD-10-CM | POA: Diagnosis not present

## 2021-05-06 DIAGNOSIS — M25461 Effusion, right knee: Secondary | ICD-10-CM | POA: Diagnosis not present

## 2021-05-06 DIAGNOSIS — M2241 Chondromalacia patellae, right knee: Secondary | ICD-10-CM | POA: Diagnosis not present

## 2021-05-07 NOTE — Telephone Encounter (Signed)
Results has been given. I spoke with patient & she stated that she ended up going to [facility] walk-in. She had an MRI & goes tomorrow for results. Baker's cyst she said hopefully with be drained or whatever is needed for her not to be in pain.

## 2021-05-08 DIAGNOSIS — M25461 Effusion, right knee: Secondary | ICD-10-CM | POA: Diagnosis not present

## 2021-05-12 ENCOUNTER — Telehealth: Payer: Self-pay | Admitting: Internal Medicine

## 2021-05-12 NOTE — Telephone Encounter (Signed)
Rejection Reason - Patient went elsewhere" Grayland Jack said on May 12, 2021 2:57 PM  Msg from [facility] orthopedic surgery

## 2021-05-30 ENCOUNTER — Telehealth: Payer: Self-pay | Admitting: Internal Medicine

## 2021-05-30 DIAGNOSIS — Z20822 Contact with and (suspected) exposure to covid-19: Secondary | ICD-10-CM | POA: Diagnosis not present

## 2021-05-30 DIAGNOSIS — Z03818 Encounter for observation for suspected exposure to other biological agents ruled out: Secondary | ICD-10-CM | POA: Diagnosis not present

## 2021-05-30 NOTE — Telephone Encounter (Signed)
PT would like to see if she can get a covid test here and if it can be done today.

## 2021-06-03 ENCOUNTER — Ambulatory Visit: Payer: Medicare Other | Admitting: Internal Medicine

## 2021-06-13 DIAGNOSIS — S86119A Strain of other muscle(s) and tendon(s) of posterior muscle group at lower leg level, unspecified leg, initial encounter: Secondary | ICD-10-CM | POA: Insufficient documentation

## 2021-06-13 DIAGNOSIS — S86111A Strain of other muscle(s) and tendon(s) of posterior muscle group at lower leg level, right leg, initial encounter: Secondary | ICD-10-CM | POA: Diagnosis not present

## 2021-06-16 ENCOUNTER — Telehealth: Payer: Self-pay

## 2021-06-16 ENCOUNTER — Other Ambulatory Visit: Payer: Self-pay

## 2021-06-16 MED ORDER — OMEPRAZOLE 20 MG PO CPDR
20.0000 mg | DELAYED_RELEASE_CAPSULE | Freq: Every day | ORAL | 1 refills | Status: DC
Start: 2021-06-16 — End: 2021-08-18

## 2021-06-16 MED ORDER — OMEPRAZOLE 20 MG PO CPDR: 20.0000 mg | DELAYED_RELEASE_CAPSULE | Freq: Every day | ORAL | 1 refills | Status: DC

## 2021-06-16 NOTE — Telephone Encounter (Signed)
Called and cancelled the prescription of [facility] and sent in the medication to [facility] in [place] per patient request.

## 2021-06-16 NOTE — Telephone Encounter (Signed)
Medication has been refilled and sent to [facility] on [address] in [place]

## 2021-06-16 NOTE — Telephone Encounter (Signed)
Pt needs a refill of omeprazole (PRILOSEC) 20 MG capsule sent to [facility] on [address] in [place]

## 2021-06-26 ENCOUNTER — Ambulatory Visit (INDEPENDENT_AMBULATORY_CARE_PROVIDER_SITE_OTHER): Payer: 59 | Admitting: Internal Medicine

## 2021-06-26 ENCOUNTER — Other Ambulatory Visit: Payer: Self-pay

## 2021-06-26 ENCOUNTER — Encounter: Payer: Self-pay | Admitting: Internal Medicine

## 2021-06-26 VITALS — BP 110/72 | HR 70 | Temp 98.2°F | Ht 65.98 in | Wt 229.4 lb

## 2021-06-26 DIAGNOSIS — M7121 Synovial cyst of popliteal space [Baker], right knee: Secondary | ICD-10-CM

## 2021-06-26 DIAGNOSIS — R3911 Hesitancy of micturition: Secondary | ICD-10-CM

## 2021-06-26 DIAGNOSIS — R350 Frequency of micturition: Secondary | ICD-10-CM | POA: Diagnosis not present

## 2021-06-26 DIAGNOSIS — E559 Vitamin D deficiency, unspecified: Secondary | ICD-10-CM | POA: Diagnosis not present

## 2021-06-26 DIAGNOSIS — E785 Hyperlipidemia, unspecified: Secondary | ICD-10-CM

## 2021-06-26 DIAGNOSIS — R300 Dysuria: Secondary | ICD-10-CM | POA: Diagnosis not present

## 2021-06-26 DIAGNOSIS — G4733 Obstructive sleep apnea (adult) (pediatric): Secondary | ICD-10-CM

## 2021-06-26 DIAGNOSIS — E890 Postprocedural hypothyroidism: Secondary | ICD-10-CM | POA: Diagnosis not present

## 2021-06-26 LAB — POCT URINALYSIS DIPSTICK
Glucose, UA: NEGATIVE
Ketones, UA: NEGATIVE
Nitrite, UA: NEGATIVE
Protein, UA: POSITIVE — AB
Spec Grav, UA: 1.025 (ref 1.010–1.025)
Urobilinogen, UA: 1 E.U./dL
pH, UA: 5.5 (ref 5.0–8.0)

## 2021-06-26 LAB — URINALYSIS, ROUTINE W REFLEX MICROSCOPIC
Nitrite: NEGATIVE
Specific Gravity, Urine: 1.025 (ref 1.000–1.030)
Total Protein, Urine: 100 — AB
Urine Glucose: NEGATIVE
Urobilinogen, UA: 1 (ref 0.0–1.0)
pH: 6 (ref 5.0–8.0)

## 2021-06-26 NOTE — Progress Notes (Signed)
Subjective:  Patient ID: Julie Perez, female    DOB: [birth year]  Age: 75 y.o. MRN: [id number]  CC: The primary encounter diagnosis was Dysuria. Diagnoses of Urinary frequency, Vitamin D deficiency, OSA (obstructive sleep apnea), Hyperlipidemia LDL goal <130, Hypothyroidism, postradioiodine therapy, Popliteal cyst, right, and Urinary hesitancy were also pertinent to this visit.  HPI Julie Perez presents for evaluation of leg pain and dysuria   This visit occurred during the SARS-CoV-2 public health emergency.  Safety protocols were in place, including screening questions prior to the visit, additional usage of staff PPE, and extensive cleaning of exam room while observing appropriate contact time as indicated for disinfecting solutions.    Evaluated on June 1 by NP for right knee and lower leg pain that was first noticed  in Feb.   Sent for DVT rule out:  Negative but popliteal cyst noted.  Referred to orthopedics , sent for MRI of popliteal cyst,  told it was too small to drain, sent to [place] for PT.   PT I July was cancelled due to diarrhea,   rescheduled for September ,  pain has since resolved with prednisone  Now just having increased right buttock pain In the sciatic notch that does not radiate.  Thinks it is chronic,  but worse since the prednisone was stopped on or around July 26     3) frequent urination of small amounts and bladder spasm during the void.  Has been occurring for the past week.  No blood in urine or nausea.  No swimming  pool , travel or sex.   TAKING 1000 IUS OF VITAMIN D  NOT EXERCISING OR WALKING DAILY.,  HAS STOPPED HOUSE CLEANING   Outpatient Medications Prior to Visit  Medication Sig Dispense Refill   acyclovir (ZOVIRAX) 400 MG tablet Take 1 tablet (400 mg total) by mouth daily. 90 tablet 3   amLODipine (NORVASC) 5 MG tablet Take 1 tablet (5 mg total) by mouth at bedtime. 90 tablet 3   atorvastatin (LIPITOR) 40 MG tablet Take 1 tablet (40 mg  total) by mouth at bedtime. 90 tablet 1   citalopram (CELEXA) 20 MG tablet TAKE 1 TABLET DAILY 90 tablet 3   estradiol (ESTRACE VAGINAL) 0.1 MG/GM vaginal cream Apply 0.'5mg'$  (pea-sized amount)  just inside the vaginal introitus with a finger-tip on Monday, Wednesday and Friday nights. 30 g 12   levothyroxine (SYNTHROID) 112 MCG tablet Take 1 tablet (112 mcg total) by mouth daily before breakfast. 90 tablet 3   lidocaine (LIDODERM) 5 % Place 1 patch onto the skin daily. Remove & Discard patch within 12 hours or as directed by MD 30 patch 0   omeprazole (PRILOSEC) 20 MG capsule Take 1 capsule (20 mg total) by mouth daily. 90 capsule 1   Vitamin D, Ergocalciferol, (DRISDOL) 1.25 MG (50000 UNIT) CAPS capsule TAKE 1 CAPSULE BY MOUTH 1 TIME A WEEK 12 capsule 0   meloxicam (MOBIC) 15 MG tablet Take 1 tablet by mouth daily.     No facility-administered medications prior to visit.    Review of Systems;  Patient denies headache, fevers, malaise, unintentional weight loss, skin rash, eye pain, sinus congestion and sinus pain, sore throat, dysphagia,  hemoptysis , cough, dyspnea, wheezing, chest pain, palpitations, orthopnea, edema, abdominal pain, nausea, melena, diarrhea, constipation, flank pain, dysuria, hematuria, urinary  Frequency, nocturia, numbness, tingling, seizures,  Focal weakness, Loss of consciousness,  Tremor, insomnia, depression, anxiety, and suicidal ideation.      Objective:  BP 110/72 (BP Location: Left Arm, Patient Position: Sitting, Cuff Size: Large)   Pulse 70   Temp 98.2 F (36.8 C) (Oral)   Ht 5' 5.98" (1.676 m)   Wt 229 lb 6.4 oz (104.1 kg)   SpO2 98%   BMI 37.05 kg/m   BP Readings from Last 3 Encounters:  06/26/21 110/72  04/22/21 124/82  01/17/21 (!) 155/88    Wt Readings from Last 3 Encounters:  06/26/21 229 lb 6.4 oz (104.1 kg)  04/22/21 228 lb 9.6 oz (103.7 kg)  01/17/21 227 lb 12.8 oz (103.3 kg)    General appearance: alert, cooperative and appears stated  age Ears: normal TM's and external ear canals both ears Throat: lips, mucosa, and tongue normal; teeth and gums normal Neck: no adenopathy, no carotid bruit, supple, symmetrical, trachea midline and thyroid not enlarged, symmetric, no tenderness/mass/nodules Back: symmetric, no curvature. ROM normal. No CVA tenderness. Lungs: clear to auscultation bilaterally Heart: regular rate and rhythm, S1, S2 normal, no murmur, click, rub or gallop Abdomen: soft, non-tender; bowel sounds normal; no masses,  no organomegaly Pulses: 2+ and symmetric Skin: Skin color, texture, turgor normal. No rashes or lesions Lymph nodes: Cervical, supraclavicular, and axillary nodes normal.  Lab Results  Component Value Date   HGBA1C 5.4 09/01/2019   HGBA1C 5.5 01/04/2017    Lab Results  Component Value Date   CREATININE 0.75 06/26/2021   CREATININE 0.67 01/17/2021   CREATININE 0.75 06/11/2020    Lab Results  Component Value Date   WBC 6.2 06/11/2020   HGB 14.2 06/11/2020   HCT 42.0 06/11/2020   PLT 215 06/11/2020   GLUCOSE 94 06/26/2021   CHOL 183 01/17/2021   TRIG 188.0 (H) 01/17/2021   HDL 44.70 01/17/2021   LDLDIRECT 113.0 05/20/2016   LDLCALC 101 (H) 01/17/2021   ALT 23 06/26/2021   AST 21 06/26/2021   NA 138 06/26/2021   K 3.7 06/26/2021   CL 101 06/26/2021   CREATININE 0.75 06/26/2021   BUN 13 06/26/2021   CO2 27 06/26/2021   TSH 3.77 06/26/2021   HGBA1C 5.4 09/01/2019    DG Tibia/Fibula Right  Result Date: 04/25/2021 CLINICAL DATA:  Right knee and right lower leg pain. Difficulty walking. EXAM: RIGHT TIBIA AND FIBULA - 2 VIEW COMPARISON:  None. FINDINGS: Cortical margins of the tibia and fibula are intact. There is no evidence of fracture or other focal bone lesions. Knee alignment assessed on concurrent knee exam. Ankle alignment is maintained. Mild soft tissue edema anteriorly. IMPRESSION: Mild soft tissue edema anteriorly. No acute osseous abnormality. Electronically Signed   By:  Keith Rake M.D.   On: 04/25/2021 16:51   DG Knee 3 Views Right  Result Date: 04/25/2021 CLINICAL DATA:  Right knee and right lower extremity pain. Difficulty walking. EXAM: RIGHT KNEE - 3 VIEW COMPARISON:  None. FINDINGS: Mild medial tibiofemoral joint space narrowing. Mild peripheral spurring, most prominently affecting the medial tibiofemoral compartment. Trace spurring of the tibial spines. There is a small knee joint effusion. No erosion or bone destruction. Mild soft tissue edema anteriorly. IMPRESSION: Mild osteoarthritis of the right knee most prominently involving the medial tibiofemoral compartment. Small knee joint effusion. Electronically Signed   By: Keith Rake M.D.   On: 04/25/2021 16:50    Assessment & Plan:   Problem List Items Addressed This Visit       Unprioritized   Hyperlipidemia LDL goal <130   Relevant Orders   Comprehensive metabolic panel (Completed)   Hypothyroidism,  postradioiodine therapy    Thyroid function is WNL on current dose.  No current changes needed.   Lab Results  Component Value Date   TSH 3.77 06/26/2021         Relevant Orders   TSH (Completed)   Vitamin D deficiency    Recurrent.  Prescribing mega dose 5000 IUS weekly  x 3 months        Relevant Orders   Vitamin D (25 hydroxy) (Completed)   OSA (obstructive sleep apnea)   Popliteal cyst, right    Too small to drain per orthopedics consult.  pai resolved with prednisone       Urinary hesitancy    UA abnormal and suggestive of infection, ,  But unfortunately culture was not sent because of  An ordering mistake (ordered as future). Will treat empirically with cipro and repeat testing if symptoms do not resolve       Other Visit Diagnoses     Dysuria    -  Primary   Relevant Orders   POCT Urinalysis Dipstick (Completed)   Urinalysis, Routine w reflex microscopic (Completed)   Urinary frequency       Relevant Orders   Urine Culture       I provided  30 minutes   during this encounter reviewing patient's current problems  recent orthopedic evaluations and MRI,  providing counseling on the above mentioned problems In a face to face visit  , and coordination  of care .   There are no discontinued medications.  Follow-up: No follow-ups on file.   Crecencio Mc, MD

## 2021-06-26 NOTE — Patient Instructions (Signed)
Your urinalysis is abnormal and may represent infection.  I am waiting for the results of your culture to decide on treatment  Please check your Mychart messages over the next 72 hours  because I will notify you with the results and be able to send in the correct antibiotic to your pharmacy . iF YOUR SYMPTOMS ARE SEVERE AND YOU CANNOT WAIT FOR THE CULTURE , please contact the office tomorrow BY PHONE  and I will call in an antibiotic this afternoon..  we may have to change the antibiotic once I see the culture results.   ,MASSAGE THERAPY AND STRETCHING FOR THE SCIATIC [place] PAIN

## 2021-06-27 ENCOUNTER — Telehealth: Payer: Self-pay | Admitting: Internal Medicine

## 2021-06-27 LAB — COMPREHENSIVE METABOLIC PANEL
ALT: 23 U/L (ref 0–35)
AST: 21 U/L (ref 0–37)
Albumin: 4.3 g/dL (ref 3.5–5.2)
Alkaline Phosphatase: 62 U/L (ref 39–117)
BUN: 13 mg/dL (ref 6–23)
CO2: 27 mEq/L (ref 19–32)
Calcium: 9.1 mg/dL (ref 8.4–10.5)
Chloride: 101 mEq/L (ref 96–112)
Creatinine, Ser: 0.75 mg/dL (ref 0.40–1.20)
GFR: 78.63 mL/min (ref >60.00)
Glucose, Bld: 94 mg/dL (ref 70–99)
Potassium: 3.7 mEq/L (ref 3.5–5.1)
Sodium: 138 mEq/L (ref 135–145)
Total Bilirubin: 0.9 mg/dL (ref 0.2–1.2)
Total Protein: 6.9 g/dL (ref 6.0–8.3)

## 2021-06-27 LAB — VITAMIN D 25 HYDROXY (VIT D DEFICIENCY, FRACTURES): VITD: 24.34 ng/mL — ABNORMAL LOW (ref 30.00–100.00)

## 2021-06-27 LAB — TSH: TSH: 3.77 u[IU]/mL (ref 0.35–5.50)

## 2021-06-27 NOTE — Telephone Encounter (Signed)
Results have not bee seen by provider as of yet.

## 2021-06-27 NOTE — Telephone Encounter (Signed)
Patients MyChart is down and she is calling to go over her lab results.

## 2021-06-29 DIAGNOSIS — R3911 Hesitancy of micturition: Secondary | ICD-10-CM | POA: Insufficient documentation

## 2021-06-29 MED ORDER — CIPROFLOXACIN HCL 250 MG PO TABS: 250.0000 mg | ORAL_TABLET | Freq: Two times a day (BID) | ORAL | 0 refills | Status: AC

## 2021-06-29 MED ORDER — ERGOCALCIFEROL 1.25 MG (50000 UT) PO CAPS: 50000.0000 [IU] | ORAL_CAPSULE | ORAL | 0 refills | Status: DC

## 2021-06-29 NOTE — Assessment & Plan Note (Signed)
UA abnormal and suggestive of infection, ,  But unfortunately culture was not sent because of  An ordering mistake (ordered as future). Will treat empirically with cipro and repeat testing if symptoms do not resolve

## 2021-06-29 NOTE — Assessment & Plan Note (Signed)
Thyroid function is WNL on current dose.  No current changes needed.   Lab Results  Component Value Date   TSH 3.77 06/26/2021

## 2021-06-29 NOTE — Assessment & Plan Note (Signed)
Recurrent.  Prescribing mega dose 5000 IUS weekly  x 3 months

## 2021-06-29 NOTE — Assessment & Plan Note (Signed)
Too small to drain per orthopedics consult.  pai resolved with prednisone

## 2021-07-08 ENCOUNTER — Other Ambulatory Visit: Payer: Self-pay | Admitting: Internal Medicine

## 2021-07-08 DIAGNOSIS — E785 Hyperlipidemia, unspecified: Secondary | ICD-10-CM

## 2021-07-08 DIAGNOSIS — F411 Generalized anxiety disorder: Secondary | ICD-10-CM

## 2021-08-18 ENCOUNTER — Other Ambulatory Visit: Payer: Self-pay

## 2021-08-18 ENCOUNTER — Ambulatory Visit: Payer: 59 | Admitting: Internal Medicine

## 2021-08-18 ENCOUNTER — Encounter: Payer: Self-pay | Admitting: Internal Medicine

## 2021-08-18 VITALS — BP 128/82 | HR 74 | Temp 96.6°F | Ht 65.98 in | Wt 228.4 lb

## 2021-08-18 DIAGNOSIS — J069 Acute upper respiratory infection, unspecified: Secondary | ICD-10-CM

## 2021-08-18 DIAGNOSIS — E890 Postprocedural hypothyroidism: Secondary | ICD-10-CM

## 2021-08-18 DIAGNOSIS — E538 Deficiency of other specified B group vitamins: Secondary | ICD-10-CM

## 2021-08-18 DIAGNOSIS — R053 Chronic cough: Secondary | ICD-10-CM

## 2021-08-18 DIAGNOSIS — R5383 Other fatigue: Secondary | ICD-10-CM

## 2021-08-18 DIAGNOSIS — G4733 Obstructive sleep apnea (adult) (pediatric): Secondary | ICD-10-CM

## 2021-08-18 LAB — POC COVID19 BINAXNOW

## 2021-08-18 MED ORDER — BENZONATATE 100 MG PO CAPS: 100.0000 mg | ORAL_CAPSULE | Freq: Three times a day (TID) | ORAL | 0 refills | Status: DC | PRN

## 2021-08-18 MED ORDER — OMEPRAZOLE 40 MG PO CPDR: 40.0000 mg | DELAYED_RELEASE_CAPSULE | Freq: Every day | ORAL | 1 refills | Status: DC

## 2021-08-18 NOTE — Assessment & Plan Note (Signed)
Likely due to untreated sleep apnea given normal TSH < 2 months ago.  Testing b12, iron , hgb and TSH. If normal will recommend sleep study.

## 2021-08-18 NOTE — Progress Notes (Signed)
Subjective:  Patient ID: Julie Perez, female    DOB: 04/16/1946  Age: 75 y.o. MRN: [id number]  CC: The primary encounter diagnosis was URI, acute. Diagnoses of Chronic cough, Fatigue, unspecified type, Hypothyroidism, postradioiodine therapy, B12 deficiency, Acute URI, and OSA (obstructive sleep apnea) were also pertinent to this visit.  HPI Julie Perez presents for evlauation of fatigue Chief Complaint  Patient presents with   Thyroid Problem    Pt wants her thyroid levels checked . She's had some exhaustion and weight gain.   This visit occurred during the SARS-CoV-2 public health emergency.  Safety protocols were in place, including screening questions prior to the visit, additional usage of staff PPE, and extensive cleaning of exam room while observing appropriate contact time as indicated for disinfecting solutions.   1) Fatigue and weight gain for several months. history of underactive thyroid. Has not missed any doses of levothyroxine .  does not exercise.   Wakes up tired.  Snores.  Refuses CPAP although she has documented OSA by remote testing  by Dr Humphrey Rolls (over 5 years ago). .Weight has not changed over the past 12 months, but 8 lb weight gain since 2017 .  BMI 37    2) chronic  dry hacking cough much improved but not  resolved with once daily  20 mg  omeprazole in the morning,  during the day.  No nighttime cough   However, current cough is different : productive of mucus which is clear  for the last 3 days.  She Has not tested for COVID and denies contact  and other symptoms   (no body aches,  sinus drainage)   Patient coughing quite frequently during office visit.  In house testing was done with POC and PCR for COVID, flu and RSV      Outpatient Medications Prior to Visit  Medication Sig Dispense Refill   acyclovir (ZOVIRAX) 400 MG tablet Take 1 tablet (400 mg total) by mouth daily. 90 tablet 3   amLODipine (NORVASC) 5 MG tablet Take 1 tablet (5 mg total) by mouth  at bedtime. 90 tablet 3   atorvastatin (LIPITOR) 40 MG tablet TAKE 1 TABLET(40 MG) BY MOUTH AT BEDTIME 90 tablet 1   citalopram (CELEXA) 20 MG tablet TAKE 1 TABLET(20 MG) BY MOUTH DAILY 90 tablet 3   ergocalciferol (DRISDOL) 1.25 MG (50000 UT) capsule Take 1 capsule (50,000 Units total) by mouth once a week. 12 capsule 0   estradiol (ESTRACE VAGINAL) 0.1 MG/GM vaginal cream Apply 0.5mg  (pea-sized amount)  just inside the vaginal introitus with a finger-tip on Monday, Wednesday and Friday nights. 30 g 12   levothyroxine (SYNTHROID) 112 MCG tablet Take 1 tablet (112 mcg total) by mouth daily before breakfast. 90 tablet 3   meloxicam (MOBIC) 15 MG tablet Take 1 tablet by mouth daily.     omeprazole (PRILOSEC) 20 MG capsule Take 1 capsule (20 mg total) by mouth daily. 90 capsule 1   lidocaine (LIDODERM) 5 % Place 1 patch onto the skin daily. Remove & Discard patch within 12 hours or as directed by MD (Patient not taking: Reported on 08/18/2021) 30 patch 0   Vitamin D, Ergocalciferol, (DRISDOL) 1.25 MG (50000 UNIT) CAPS capsule TAKE 1 CAPSULE BY MOUTH 1 TIME A WEEK (Patient not taking: Reported on 08/18/2021) 12 capsule 0   No facility-administered medications prior to visit.    Review of Systems;  Patient denies headache, fevers, malaise, unintentional weight loss, skin rash, eye pain, sinus congestion  and sinus pain, sore throat, dysphagia,  hemoptysis , cough, dyspnea, wheezing, chest pain, palpitations, orthopnea, edema, abdominal pain, nausea, melena, diarrhea, constipation, flank pain, dysuria, hematuria, urinary  Frequency, nocturia, numbness, tingling, seizures,  Focal weakness, Loss of consciousness,  Tremor, insomnia, depression, anxiety, and suicidal ideation.      Objective:  BP 128/82   Pulse 74   Temp (!) 96.6 F (35.9 C)   Ht 5' 5.98" (1.676 m)   Wt 228 lb 6.4 oz (103.6 kg)   SpO2 96%   BMI 36.88 kg/m   BP Readings from Last 3 Encounters:  08/18/21 128/82  06/26/21 110/72   04/22/21 124/82    Wt Readings from Last 3 Encounters:  08/18/21 228 lb 6.4 oz (103.6 kg)  06/26/21 229 lb 6.4 oz (104.1 kg)  04/22/21 228 lb 9.6 oz (103.7 kg)    General appearance: alert, cooperative and appears stated age Ears: normal TM's and external ear canals both ears Throat: lips, mucosa, and tongue normal; teeth and gums normal Neck: no adenopathy, no carotid bruit, supple, symmetrical, trachea midline and thyroid not enlarged, symmetric, no tenderness/mass/nodules Back: symmetric, no curvature. ROM normal. No CVA tenderness. Lungs: clear to auscultation bilaterally Heart: regular rate and rhythm, S1, S2 normal, no murmur, click, rub or gallop Abdomen: soft, non-tender; bowel sounds normal; no masses,  no organomegaly Pulses: 2+ and symmetric Skin: Skin color, texture, turgor normal. No rashes or lesions Lymph nodes: Cervical, supraclavicular, and axillary nodes normal.  Lab Results  Component Value Date   HGBA1C 5.4 09/01/2019   HGBA1C 5.5 01/04/2017    Lab Results  Component Value Date   CREATININE 0.75 06/26/2021   CREATININE 0.67 01/17/2021   CREATININE 0.75 06/11/2020    Lab Results  Component Value Date   WBC 6.2 06/11/2020   HGB 14.2 06/11/2020   HCT 42.0 06/11/2020   PLT 215 06/11/2020   GLUCOSE 94 06/26/2021   CHOL 183 01/17/2021   TRIG 188.0 (H) 01/17/2021   HDL 44.70 01/17/2021   LDLDIRECT 113.0 05/20/2016   LDLCALC 101 (H) 01/17/2021   ALT 23 06/26/2021   AST 21 06/26/2021   NA 138 06/26/2021   K 3.7 06/26/2021   CL 101 06/26/2021   CREATININE 0.75 06/26/2021   BUN 13 06/26/2021   CO2 27 06/26/2021   TSH 3.77 06/26/2021   HGBA1C 5.4 09/01/2019    DG Tibia/Fibula Right  Result Date: 04/25/2021 CLINICAL DATA:  Right knee and right lower leg pain. Difficulty walking. EXAM: RIGHT TIBIA AND FIBULA - 2 VIEW COMPARISON:  None. FINDINGS: Cortical margins of the tibia and fibula are intact. There is no evidence of fracture or other focal bone  lesions. Knee alignment assessed on concurrent knee exam. Ankle alignment is maintained. Mild soft tissue edema anteriorly. IMPRESSION: Mild soft tissue edema anteriorly. No acute osseous abnormality. Electronically Signed   By: Keith Rake M.D.   On: 04/25/2021 16:51   DG Knee 3 Views Right  Result Date: 04/25/2021 CLINICAL DATA:  Right knee and right lower extremity pain. Difficulty walking. EXAM: RIGHT KNEE - 3 VIEW COMPARISON:  None. FINDINGS: Mild medial tibiofemoral joint space narrowing. Mild peripheral spurring, most prominently affecting the medial tibiofemoral compartment. Trace spurring of the tibial spines. There is a small knee joint effusion. No erosion or bone destruction. Mild soft tissue edema anteriorly. IMPRESSION: Mild osteoarthritis of the right knee most prominently involving the medial tibiofemoral compartment. Small knee joint effusion. Electronically Signed   By: Aurther Loft.D.  On: 04/25/2021 16:50    Assessment & Plan:   Problem List Items Addressed This Visit       Unprioritized   Acute URI    POC COVID test In house was negative.  PCR FOR COVID RSV AND FLU is pending.  Tessalon perles  For cough  Other meds as results predict       B12 deficiency   Relevant Orders   Vitamin B12   Chronic cough    Improved but not resolved with treatment of GERD with 20 mg omeprazole daily.  chest x ray was normal Dec 2021 .ECHO normal in 2019 and she has no nocturnal symptoms to report since sleeping on a wedge pillow. Will increase omeprazole to 40 mg daily ; if no resolution ,  Continue workup with pulmonary consult       Fatigue    Likely due to untreated sleep apnea given normal TSH < 2 months ago.  Testing b12, iron , hgb and TSH. If normal will recommend sleep study.       Relevant Orders   CBC with Differential/Platelet   Comprehensive metabolic panel   Iron, TIBC and Ferritin Panel   Hypothyroidism, postradioiodine therapy    Thyroid function was WNL  on current dose less than 2 months ago.  She is requesting repeat testing to determine the cause of her fatigue .   Lab Results  Component Value Date   TSH 3.77 06/26/2021         Relevant Orders   TSH   OSA (obstructive sleep apnea)    . diagnosed with remote  sleep study but treatment has been deferred  by patient. Once again I reviewed with her the long term history of OSA , the risks of long term damage to heart and the signs and symptoms attributable to OSA.  Advised patient to consider  significant weight loss repeatstudy      Other Visit Diagnoses     URI, acute    -  Primary   Relevant Orders   COVID-19, Flu A+B and RSV   POC COVID-19       Meds ordered this encounter  Medications   benzonatate (TESSALON) 100 MG capsule    Sig: Take 1 capsule (100 mg total) by mouth 3 (three) times daily as needed for cough.    Dispense:  30 capsule    Refill:  0   omeprazole (PRILOSEC) 40 MG capsule    Sig: Take 1 capsule (40 mg total) by mouth daily.    Dispense:  90 capsule    Refill:  1   I provided  40 minutes of  face-to-face time during this encounter reviewing patient's current problems and past surgeries, labs and imaging studies, providing counseling on the above mentioned problems , and coordination  of care .   Medications Discontinued During This Encounter  Medication Reason   omeprazole (PRILOSEC) 20 MG capsule     Follow-up: No follow-ups on file.   Crecencio Mc, MD

## 2021-08-18 NOTE — Assessment & Plan Note (Addendum)
.   diagnosed with remote  sleep study but treatment has been deferred  by patient. Once again I reviewed with her the long term history of OSA , the risks of long term damage to heart and the signs and symptoms attributable to OSA.  Advised patient to consider  significant weight loss repeatstudy

## 2021-08-18 NOTE — Patient Instructions (Signed)
I have increased your omeprazole dose to 40 mg daily for the chronic dry cough.  If the cough does not resolve on the higher PPI dose,  I recommend an evaluation by pulmonology to rule out other causes.   Once you are feeling better  return for labs to evaluate the fatigue. If the labs are normal  I recommend repeating the sleep study to assess the severity of your previously diagnosed sleep apnea.

## 2021-08-18 NOTE — Assessment & Plan Note (Addendum)
Improved but not resolved with treatment of GERD with 20 mg omeprazole daily.  chest x ray was normal Dec 2021 .ECHO normal in 2019 and she has no nocturnal symptoms to report since sleeping on a wedge pillow. Will increase omeprazole to 40 mg daily ; if no resolution ,  Continue workup with pulmonary consult

## 2021-08-18 NOTE — Assessment & Plan Note (Signed)
Thyroid function was WNL on current dose less than 2 months ago.  She is requesting repeat testing to determine the cause of her fatigue .   Lab Results  Component Value Date   TSH 3.77 06/26/2021

## 2021-08-18 NOTE — Assessment & Plan Note (Signed)
POC COVID test In house was negative.  PCR FOR COVID RSV AND FLU is pending.  Tessalon perles  For cough  Other meds as results predict

## 2021-08-19 ENCOUNTER — Telehealth: Payer: Self-pay | Admitting: Internal Medicine

## 2021-08-19 LAB — COVID-19, FLU A+B AND RSV
Influenza A, NAA: NOT DETECTED
Influenza B, NAA: NOT DETECTED
RSV, NAA: NOT DETECTED
SARS-CoV-2, NAA: NOT DETECTED

## 2021-08-19 NOTE — Telephone Encounter (Signed)
Patient calling in to ask about her Covid results. Did not see these in her chart.   Patient said she was told that either Kristal or Janett Billow would call her with the results. Please advise

## 2021-08-20 ENCOUNTER — Other Ambulatory Visit: Payer: Self-pay | Admitting: Internal Medicine

## 2021-08-20 ENCOUNTER — Telehealth: Payer: Self-pay | Admitting: Internal Medicine

## 2021-08-20 DIAGNOSIS — J22 Unspecified acute lower respiratory infection: Secondary | ICD-10-CM

## 2021-08-20 DIAGNOSIS — J069 Acute upper respiratory infection, unspecified: Secondary | ICD-10-CM

## 2021-08-20 MED ORDER — PREDNISONE 10 MG PO TABS: ORAL_TABLET | ORAL | 0 refills | Status: DC

## 2021-08-20 NOTE — Telephone Encounter (Signed)
Patient is calling in from earlier and would like for Dr.Tullo to call in the cough syrup she discussed earlier.Please send it to the [facility] in [place] on [address].

## 2021-08-20 NOTE — Telephone Encounter (Signed)
Pt was seen on Monday for a URI

## 2021-08-20 NOTE — Telephone Encounter (Signed)
Spoke with pt and she stated that she has been taking the tessalon pearls but that they did not help. She stated that she picked up some OTC robitussin to take for the cough so she does not want the rx cough syrup sent in right now.

## 2021-08-20 NOTE — Progress Notes (Unsigned)
Please ask whether she has picked up the tessalon perles sent in on sept 26  I am adding prednisone taper

## 2021-08-21 NOTE — Telephone Encounter (Signed)
Pt reviewed results on mychart.

## 2021-08-22 MED ORDER — HYDROCOD POLST-CPM POLST ER 10-8 MG/5ML PO SUER: 5.0000 mL | Freq: Two times a day (BID) | ORAL | 0 refills | Status: DC | PRN

## 2021-08-22 MED ORDER — LEVOFLOXACIN 500 MG PO TABS: 500.0000 mg | ORAL_TABLET | Freq: Every day | ORAL | 0 refills | Status: AC

## 2021-08-22 NOTE — Telephone Encounter (Signed)
I have added a once daily antibiotic,  levaquin  take with food for 7 days  And a cough suppressant that has a low dose of hydrocodone in it to take every 12 hours to [place]. It make make her sleepy or constipated.  She can continue robitussin or tessalon cough capsules during the day if it makes her too sleepy and just use it at night   If she does not improve in 48 hours or develops shortness of breath needs to go to ER

## 2021-08-22 NOTE — Telephone Encounter (Signed)
Patient calling back in and states she is having sever chest congestion. States she can not break it up and get it out even though she has a bad cough. Wanting to know if the cough syrup suggested will help? She would like this sent in.   States Robitussin and Mucinex not helping.

## 2021-08-22 NOTE — Telephone Encounter (Signed)
Spoke with pt about the medications that have been sent in. Pt was wanting to know if a chest xray could be ordered. Pt is aware that the office is closed and she would have to go to [facility] to have the xray done.

## 2021-08-22 NOTE — Telephone Encounter (Signed)
Spoke with pt and she stated that she would go tomorrow to have the xray done

## 2021-08-22 NOTE — Telephone Encounter (Signed)
CHEST X RAY ORDERED

## 2021-08-22 NOTE — Telephone Encounter (Signed)
Patient and son calling in and states that they are concerned for a bacterial infection or pneumonia.   Please advise

## 2021-08-23 ENCOUNTER — Ambulatory Visit
Admission: RE | Admit: 2021-08-23 | Discharge: 2021-08-23 | Disposition: A | Discharge status: Home / Self Care | Payer: Medicare Other | Attending: Internal Medicine | Admitting: Internal Medicine

## 2021-08-23 ENCOUNTER — Ambulatory Visit
Admission: RE | Admit: 2021-08-23 | Discharge: 2021-08-23 | Disposition: A | Discharge status: Home / Self Care | Payer: Medicare Other | Source: Ambulatory Visit | Attending: Internal Medicine | Admitting: Internal Medicine

## 2021-08-23 DIAGNOSIS — R059 Cough, unspecified: Secondary | ICD-10-CM | POA: Diagnosis not present

## 2021-08-23 DIAGNOSIS — J22 Unspecified acute lower respiratory infection: Secondary | ICD-10-CM | POA: Diagnosis not present

## 2021-09-18 ENCOUNTER — Ambulatory Visit (INDEPENDENT_AMBULATORY_CARE_PROVIDER_SITE_OTHER): Payer: Medicare Other | Admitting: Internal Medicine

## 2021-09-18 ENCOUNTER — Other Ambulatory Visit: Payer: Self-pay

## 2021-09-18 ENCOUNTER — Encounter: Payer: Self-pay | Admitting: Internal Medicine

## 2021-09-18 VITALS — BP 126/78 | HR 69 | Temp 96.3°F | Ht 65.0 in | Wt 223.2 lb

## 2021-09-18 DIAGNOSIS — R1319 Other dysphagia: Secondary | ICD-10-CM | POA: Diagnosis not present

## 2021-09-18 DIAGNOSIS — R5383 Other fatigue: Secondary | ICD-10-CM | POA: Diagnosis not present

## 2021-09-18 DIAGNOSIS — R053 Chronic cough: Secondary | ICD-10-CM | POA: Diagnosis not present

## 2021-09-18 DIAGNOSIS — E890 Postprocedural hypothyroidism: Secondary | ICD-10-CM

## 2021-09-18 DIAGNOSIS — Z23 Encounter for immunization: Secondary | ICD-10-CM | POA: Diagnosis not present

## 2021-09-18 DIAGNOSIS — R7989 Other specified abnormal findings of blood chemistry: Secondary | ICD-10-CM

## 2021-09-18 DIAGNOSIS — E538 Deficiency of other specified B group vitamins: Secondary | ICD-10-CM | POA: Diagnosis not present

## 2021-09-18 LAB — CBC WITH DIFFERENTIAL/PLATELET
Basophils Absolute: 0 10*3/uL (ref 0.0–0.1)
Basophils Relative: 0.6 % (ref 0.0–3.0)
Eosinophils Absolute: 0.2 10*3/uL (ref 0.0–0.7)
Eosinophils Relative: 4.3 % (ref 0.0–5.0)
HCT: 41.1 % (ref 36.0–46.0)
Hemoglobin: 13.6 g/dL (ref 12.0–15.0)
Lymphocytes Relative: 34.6 % (ref 12.0–46.0)
Lymphs Abs: 1.9 10*3/uL (ref 0.7–4.0)
MCHC: 33.1 g/dL (ref 30.0–36.0)
MCV: 90.3 fl (ref 78.0–100.0)
Monocytes Absolute: 0.4 10*3/uL (ref 0.1–1.0)
Monocytes Relative: 8 % (ref 3.0–12.0)
Neutro Abs: 3 10*3/uL (ref 1.4–7.7)
Neutrophils Relative %: 52.5 % (ref 43.0–77.0)
Platelets: 208 10*3/uL (ref 150.0–400.0)
RBC: 4.55 Mil/uL (ref 3.87–5.11)
RDW: 15.1 % (ref 11.5–15.5)
WBC: 5.6 10*3/uL (ref 4.0–10.5)

## 2021-09-18 LAB — VITAMIN B12: Vitamin B-12: 103 pg/mL — ABNORMAL LOW (ref 211–911)

## 2021-09-18 LAB — IBC + FERRITIN
Ferritin: 37.2 ng/mL (ref 10.0–291.0)
Iron: 80 ug/dL (ref 42–145)
Saturation Ratios: 23.4 % (ref 20.0–50.0)
TIBC: 341.6 ug/dL (ref 250.0–450.0)
Transferrin: 244 mg/dL (ref 212.0–360.0)

## 2021-09-18 LAB — COMPREHENSIVE METABOLIC PANEL
ALT: 18 U/L (ref 0–35)
AST: 18 U/L (ref 0–37)
Albumin: 4.4 g/dL (ref 3.5–5.2)
Alkaline Phosphatase: 64 U/L (ref 39–117)
BUN: 10 mg/dL (ref 6–23)
CO2: 30 mEq/L (ref 19–32)
Calcium: 9.2 mg/dL (ref 8.4–10.5)
Chloride: 102 mEq/L (ref 96–112)
Creatinine, Ser: 0.73 mg/dL (ref 0.40–1.20)
GFR: 81.09 mL/min (ref >60.00)
Glucose, Bld: 99 mg/dL (ref 70–99)
Potassium: 3.9 mEq/L (ref 3.5–5.1)
Sodium: 140 mEq/L (ref 135–145)
Total Bilirubin: 1.1 mg/dL (ref 0.2–1.2)
Total Protein: 7.3 g/dL (ref 6.0–8.3)

## 2021-09-18 LAB — TSH: TSH: 2.25 u[IU]/mL (ref 0.35–5.50)

## 2021-09-18 NOTE — Patient Instructions (Addendum)
Trial of Cetirizine (zyrtec) once daily for cough caused by allergies .  Continue omeprazole and wedge.    Your cough may also be coming from aspiration of gastric fluid into your lungs when your food gets stuck  I have ordered a barium swallow study and a referral to gastroenterology   SLOW DOWN YOUR EATING!  DO NOT SWALLOW ANYTHING WITHOUT GETTING IT WET (SIP WATER AND MIX WELL BEFORE SWALLOWING )

## 2021-09-18 NOTE — Progress Notes (Signed)
Subjective:  Patient ID: Julie Perez, female    DOB: 24-Apr-1946  Age: 75 y.o. MRN: [id number]  CC: The primary encounter diagnosis was Need for immunization against influenza. Diagnoses of Esophageal dysphagia, Hypothyroidism, postradioiodine therapy, Fatigue, unspecified type, B12 deficiency, and Chronic cough were also pertinent to this visit.  HPI Julie Perez presents for  Chief Complaint  Patient presents with   Follow-up    Follow up on thyroid    This visit occurred during the SARS-CoV-2 public health emergency.  Safety protocols were in place, including screening questions prior to the visit, additional usage of staff PPE, and extensive cleaning of exam room while observing appropriate contact time as indicated for disinfecting solutions.    She reports Dry hacking Chronic cough x 12 years.  No history of smoking.  Told she had sleep apnea years ago which has remained  untreated, .  Taking omeprazole 40 mg daily in the morning on an empty stomach . Sleeping  on a wedge,  no change in symptoms . Cough occurs during the day and  night . Has episodes of dysphagia for solids resulting in regurgitation of everything . Feels like the food will stop at the manubrial notch Has been going on for years.  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 90 tablet 3   amLODipine (NORVASC) 5 MG tablet Take 1 tablet (5 mg total) by mouth at bedtime. 90 tablet 3   atorvastatin (LIPITOR) 40 MG tablet TAKE 1 TABLET(40 MG) BY MOUTH AT BEDTIME 90 tablet 1   benzonatate (TESSALON) 100 MG capsule Take 1 capsule (100 mg total) by mouth 3 (three) times daily as needed for cough. 30 capsule 0   citalopram (CELEXA) 20 MG tablet TAKE 1 TABLET(20 MG) BY MOUTH DAILY 90 tablet 3   ergocalciferol (DRISDOL) 1.25 MG  (50000 UT) capsule Take 1 capsule (50,000 Units total) by mouth once a week. 12 capsule 0   estradiol (ESTRACE VAGINAL) 0.1 MG/GM vaginal cream Apply 0.5mg  (pea-sized amount)  just inside the vaginal introitus with a finger-tip on Monday, Wednesday and Friday nights. 30 g 12   levothyroxine (SYNTHROID) 112 MCG tablet Take 1 tablet (112 mcg total) by mouth daily before breakfast. 90 tablet 3   meloxicam (MOBIC) 15 MG tablet Take 1 tablet by mouth daily.     omeprazole (PRILOSEC) 20 MG capsule Take 20 mg by mouth daily.     omeprazole (PRILOSEC) 40 MG capsule Take 1 capsule (40 mg total) by mouth daily. 90 capsule 1   chlorpheniramine-HYDROcodone (TUSSIONEX PENNKINETIC ER) 10-8 MG/5ML SUER Take 5 mLs by mouth every 12 (twelve) hours as needed. (Patient not taking: Reported on 09/18/2021) 140 mL 0   lidocaine (LIDODERM) 5 % Place 1 patch onto the skin daily. Remove & Discard patch within 12 hours or as directed by MD (Patient not taking: Reported on 09/18/2021) 30 patch 0   predniSONE (DELTASONE) 10 MG tablet 6 tablets on Day 1 , then reduce by 1 tablet daily until gone (Patient not taking: Reported on 09/18/2021) 21 tablet 0   Vitamin D, Ergocalciferol, (DRISDOL) 1.25 MG (50000 UNIT) CAPS capsule TAKE 1 CAPSULE BY MOUTH 1 TIME A WEEK (Patient not taking: Reported on 09/18/2021) 12 capsule 0   No facility-administered medications prior  to visit.    Review of Systems;  Patient denies headache, fevers, malaise, unintentional weight loss, skin rash, eye pain, sinus congestion and sinus pain, sore throat, ,  hemoptysis , dyspnea, wheezing, chest pain, palpitations, orthopnea, edema, abdominal pain, nausea, melena, diarrhea, constipation, flank pain, dysuria, hematuria, urinary  Frequency, nocturia, numbness, tingling, seizures,  Focal weakness, Loss of consciousness,  Tremor, insomnia, depression, anxiety, and suicidal ideation.      Objective:  BP 126/78 (BP Location: Left Arm, Patient Position:  Sitting, Cuff Size: Large)   Pulse 69   Temp (!) 96.3 F (35.7 C) (Temporal)   Ht 5\' 5"  (1.651 m)   Wt 223 lb 3.2 oz (101.2 kg)   SpO2 96%   BMI 37.14 kg/m   BP Readings from Last 3 Encounters:  09/18/21 126/78  08/18/21 128/82  06/26/21 110/72    Wt Readings from Last 3 Encounters:  09/18/21 223 lb 3.2 oz (101.2 kg)  08/18/21 228 lb 6.4 oz (103.6 kg)  06/26/21 229 lb 6.4 oz (104.1 kg)    General appearance: alert, cooperative and appears stated age Ears: normal TM's and external ear canals both ears Throat: lips, mucosa, and tongue normal; teeth and gums normal Neck: no adenopathy, no carotid bruit, supple, symmetrical, trachea midline and thyroid not enlarged, symmetric, no tenderness/mass/nodules Back: symmetric, no curvature. ROM normal. No CVA tenderness. Lungs: clear to auscultation bilaterally Heart: regular rate and rhythm, S1, S2 normal, no murmur, click, rub or gallop Abdomen: soft, non-tender; bowel sounds normal; no masses,  no organomegaly Pulses: 2+ and symmetric Skin: Skin color, texture, turgor normal. No rashes or lesions Lymph nodes: Cervical, supraclavicular, and axillary nodes normal.  Lab Results  Component Value Date   HGBA1C 5.4 09/01/2019   HGBA1C 5.5 01/04/2017    Lab Results  Component Value Date   CREATININE 0.73 09/18/2021   CREATININE 0.75 06/26/2021   CREATININE 0.67 01/17/2021    Lab Results  Component Value Date   WBC 5.6 09/18/2021   HGB 13.6 09/18/2021   HCT 41.1 09/18/2021   PLT 208.0 09/18/2021   GLUCOSE 99 09/18/2021   CHOL 183 01/17/2021   TRIG 188.0 (H) 01/17/2021   HDL 44.70 01/17/2021   LDLDIRECT 113.0 05/20/2016   LDLCALC 101 (H) 01/17/2021   ALT 18 09/18/2021   AST 18 09/18/2021   NA 140 09/18/2021   K 3.9 09/18/2021   CL 102 09/18/2021   CREATININE 0.73 09/18/2021   BUN 10 09/18/2021   CO2 30 09/18/2021   TSH 2.25 09/18/2021   HGBA1C 5.4 09/01/2019    DG Chest 2 View  Result Date: 08/24/2021 CLINICAL  DATA:  Persistent cough 1 week. EXAM: CHEST - 2 VIEW COMPARISON:  11/12/2020 FINDINGS: Lungs are adequately inflated without focal airspace consolidation or effusion. Cardiomediastinal silhouette and remainder of the exam is unchanged. IMPRESSION: No active cardiopulmonary disease. Electronically Signed   By: Marin Olp M.D.   On: 08/24/2021 12:00    Assessment & Plan:   Problem List Items Addressed This Visit     B12 deficiency    Recurrent,,  Will require IM supplementation       Relevant Orders   Intrinsic Factor Antibodies   Chronic cough    With intermittent dysphagia for solids,  Suggest recurrent aspiration vs eosinophilic esophagiti, vs esophageal mass.  DG esophagus and GI referral in process.  Continue PPI and use of wedge.        Esophageal dysphagia    Workup in process for stricture,  achalasia etc       Relevant Orders   DG ESOPHAGUS W SINGLE CM (SOL OR THIN BA)   Fatigue   Relevant Orders   IBC + Ferritin (Completed)   Hypothyroidism, postradioiodine therapy   Other Visit Diagnoses     Need for immunization against influenza    -  Primary   Relevant Orders   Flu Vaccine QUAD High Dose(Fluad) (Completed)      I provided  24 minutes  during this encounter reviewing patient's current problems and past surgeries, labs and imaging studies, providing counseling on the above mentioned problems I n a face to face visit  , and coordination  of care .  Meds ordered this encounter  Medications   cyanocobalamin (,VITAMIN B-12,) 1000 MCG/ML injection    Sig: Inject 1 mL (1,000 mcg total) into the muscle once a week.    Dispense:  4 mL    Refill:  0    Medications Discontinued During This Encounter  Medication Reason   chlorpheniramine-HYDROcodone (TUSSIONEX PENNKINETIC ER) 10-8 MG/5ML SUER    lidocaine (LIDODERM) 5 %    predniSONE (DELTASONE) 10 MG tablet    Vitamin D, Ergocalciferol, (DRISDOL) 1.25 MG (50000 UNIT) CAPS capsule Duplicate   omeprazole (PRILOSEC)  40 MG capsule     Follow-up: No follow-ups on file.   Crecencio Mc, MD

## 2021-09-21 DIAGNOSIS — R1319 Other dysphagia: Secondary | ICD-10-CM | POA: Insufficient documentation

## 2021-09-21 MED ORDER — CYANOCOBALAMIN 1000 MCG/ML IJ SOLN: 1000.0000 ug | INTRAMUSCULAR | 0 refills | Status: DC

## 2021-09-21 NOTE — Assessment & Plan Note (Signed)
Recurrent,,  Will require IM supplementation

## 2021-09-21 NOTE — Assessment & Plan Note (Signed)
Workup in process for stricture, achalasia etc

## 2021-09-21 NOTE — Assessment & Plan Note (Signed)
With intermittent dysphagia for solids,  Suggest recurrent aspiration vs eosinophilic esophagiti, vs esophageal mass.  DG esophagus and GI referral in process.  Continue PPI and use of wedge.

## 2021-09-22 ENCOUNTER — Telehealth: Payer: Self-pay | Admitting: Internal Medicine

## 2021-09-22 NOTE — Telephone Encounter (Signed)
Pt called to inquire about last visit with provider when she was told she would be referred to a gastrologist. Pt stated she has not heard anything back. Sending message because I do not see it on my end.

## 2021-09-23 ENCOUNTER — Other Ambulatory Visit: Payer: Self-pay | Admitting: Internal Medicine

## 2021-09-23 DIAGNOSIS — I10 Essential (primary) hypertension: Secondary | ICD-10-CM

## 2021-09-23 DIAGNOSIS — R053 Chronic cough: Secondary | ICD-10-CM

## 2021-09-23 NOTE — Telephone Encounter (Signed)
Do not see a referral in in chart for GI

## 2021-09-23 NOTE — Telephone Encounter (Signed)
Per message from Dr. Derrel Nip:   Referral has been placed to first available.  All GI providers are quite booked out,  I made the request for no specific provider ,  just "first available in network".  She needs to have the esophageal study done in advance , which was ordered on day of visit

## 2021-09-29 ENCOUNTER — Ambulatory Visit (INDEPENDENT_AMBULATORY_CARE_PROVIDER_SITE_OTHER): Payer: Medicare Other

## 2021-09-29 ENCOUNTER — Other Ambulatory Visit: Payer: Self-pay

## 2021-09-29 ENCOUNTER — Other Ambulatory Visit: Payer: 59

## 2021-09-29 DIAGNOSIS — E538 Deficiency of other specified B group vitamins: Secondary | ICD-10-CM

## 2021-09-29 MED ORDER — CYANOCOBALAMIN 1000 MCG/ML IJ SOLN
1000.0000 ug | Freq: Once | INTRAMUSCULAR | Status: AC
  Administered 2021-09-29: 1000 ug via INTRAMUSCULAR

## 2021-09-29 NOTE — Progress Notes (Signed)
Patient presented for B 12 injection to left deltoid, patient voiced no concerns nor showed any signs of distress during injection. 

## 2021-09-30 ENCOUNTER — Ambulatory Visit
Admission: RE | Admit: 2021-09-30 | Discharge: 2021-09-30 | Disposition: A | Discharge status: Home / Self Care | Payer: Medicare Other | Source: Ambulatory Visit | Attending: Internal Medicine | Admitting: Internal Medicine

## 2021-09-30 DIAGNOSIS — R1319 Other dysphagia: Secondary | ICD-10-CM | POA: Insufficient documentation

## 2021-09-30 DIAGNOSIS — K222 Esophageal obstruction: Secondary | ICD-10-CM | POA: Diagnosis not present

## 2021-09-30 DIAGNOSIS — K225 Diverticulum of esophagus, acquired: Secondary | ICD-10-CM | POA: Diagnosis not present

## 2021-10-01 ENCOUNTER — Other Ambulatory Visit: Payer: Self-pay | Admitting: Internal Medicine

## 2021-10-01 MED ORDER — HYOSCYAMINE SULFATE 0.125 MG PO TBDP: 0.1250 mg | ORAL_TABLET | Freq: Four times a day (QID) | ORAL | 0 refills | Status: DC | PRN

## 2021-10-01 NOTE — Assessment & Plan Note (Signed)
DG esophagus negative for stricture : spasm  noted.  Trial of hyoscyamine. Gi referral in progress

## 2021-10-02 LAB — INTRINSIC FACTOR ANTIBODIES: Intrinsic Factor: NEGATIVE

## 2021-10-06 ENCOUNTER — Other Ambulatory Visit: Payer: Self-pay | Admitting: Internal Medicine

## 2021-10-07 ENCOUNTER — Ambulatory Visit (INDEPENDENT_AMBULATORY_CARE_PROVIDER_SITE_OTHER): Payer: Medicare Other

## 2021-10-07 ENCOUNTER — Other Ambulatory Visit: Payer: Self-pay

## 2021-10-07 DIAGNOSIS — E538 Deficiency of other specified B group vitamins: Secondary | ICD-10-CM

## 2021-10-07 MED ORDER — CYANOCOBALAMIN 1000 MCG/ML IJ SOLN
1000.0000 ug | Freq: Once | INTRAMUSCULAR | Status: AC
  Administered 2021-10-07: 1000 ug via INTRAMUSCULAR

## 2021-10-07 NOTE — Progress Notes (Signed)
Patient presented for B 12 injection to right deltoid, patient voiced no concerns nor showed any signs of distress during injection. 

## 2021-10-14 ENCOUNTER — Ambulatory Visit (INDEPENDENT_AMBULATORY_CARE_PROVIDER_SITE_OTHER): Payer: Medicare Other

## 2021-10-14 ENCOUNTER — Other Ambulatory Visit: Payer: Self-pay

## 2021-10-14 ENCOUNTER — Other Ambulatory Visit: Payer: Self-pay | Admitting: Internal Medicine

## 2021-10-14 DIAGNOSIS — E538 Deficiency of other specified B group vitamins: Secondary | ICD-10-CM

## 2021-10-14 MED ORDER — CYANOCOBALAMIN 1000 MCG/ML IJ SOLN
1000.0000 ug | Freq: Once | INTRAMUSCULAR | Status: AC
  Administered 2021-10-14: 1000 ug via INTRAMUSCULAR

## 2021-10-14 MED ORDER — HYOSCYAMINE SULFATE 0.125 MG PO TBDP: 0.1250 mg | ORAL_TABLET | Freq: Four times a day (QID) | ORAL | 0 refills | Status: DC | PRN

## 2021-10-14 NOTE — Progress Notes (Signed)
Patient presented for B 12 injection to left deltoid, patient voiced no concerns nor showed any signs of distress during injection. 

## 2021-10-22 ENCOUNTER — Other Ambulatory Visit: Payer: Self-pay

## 2021-10-22 ENCOUNTER — Ambulatory Visit (INDEPENDENT_AMBULATORY_CARE_PROVIDER_SITE_OTHER): Payer: Medicare Other

## 2021-10-22 DIAGNOSIS — E538 Deficiency of other specified B group vitamins: Secondary | ICD-10-CM | POA: Diagnosis not present

## 2021-10-22 MED ORDER — CYANOCOBALAMIN 1000 MCG/ML IJ SOLN
1000.0000 ug | Freq: Once | INTRAMUSCULAR | Status: AC
  Administered 2021-10-22: 1000 ug via INTRAMUSCULAR

## 2021-10-22 NOTE — Progress Notes (Signed)
Patient presented for B 12 injection to left deltoid, patient voiced no concerns nor showed any signs of distress during injection. 

## 2021-10-27 ENCOUNTER — Other Ambulatory Visit: Payer: Self-pay | Admitting: Internal Medicine

## 2021-10-27 DIAGNOSIS — Z1231 Encounter for screening mammogram for malignant neoplasm of breast: Secondary | ICD-10-CM

## 2021-10-29 ENCOUNTER — Other Ambulatory Visit: Payer: Self-pay

## 2021-10-29 ENCOUNTER — Ambulatory Visit
Admission: RE | Admit: 2021-10-29 | Discharge: 2021-10-29 | Disposition: A | Discharge status: Home / Self Care | Payer: Medicare Other | Source: Ambulatory Visit | Attending: Internal Medicine | Admitting: Internal Medicine

## 2021-10-29 DIAGNOSIS — Z1231 Encounter for screening mammogram for malignant neoplasm of breast: Secondary | ICD-10-CM | POA: Insufficient documentation

## 2021-11-07 ENCOUNTER — Other Ambulatory Visit: Payer: Self-pay

## 2021-11-07 ENCOUNTER — Ambulatory Visit (INDEPENDENT_AMBULATORY_CARE_PROVIDER_SITE_OTHER): Payer: Medicare Other | Admitting: Gastroenterology

## 2021-11-07 ENCOUNTER — Encounter: Payer: Self-pay | Admitting: Gastroenterology

## 2021-11-07 VITALS — BP 127/79 | HR 65 | Temp 97.9°F | Ht 65.0 in | Wt 226.6 lb

## 2021-11-07 DIAGNOSIS — R053 Chronic cough: Secondary | ICD-10-CM

## 2021-11-07 DIAGNOSIS — K219 Gastro-esophageal reflux disease without esophagitis: Secondary | ICD-10-CM

## 2021-11-07 DIAGNOSIS — K529 Noninfective gastroenteritis and colitis, unspecified: Secondary | ICD-10-CM

## 2021-11-07 MED ORDER — CLENPIQ 10-3.5-12 MG-GM -GM/160ML PO SOLN: 320.0000 mL | ORAL | 0 refills | Status: DC

## 2021-11-07 NOTE — Progress Notes (Signed)
Cephas Darby, MD [address]  [place]  [place], [place] [address]  Main: [phone]  Fax: [phone]    Gastroenterology Consultation  Referring Provider:     Crecencio Mc, MD Primary Care Physician:  Crecencio Mc, MD Primary Gastroenterologist:  Dr. Cephas Darby Reason for Consultation:     Chronic cough and chronic diarrhea        HPI:   Julie Perez is a 75 y.o. female referred by Dr. Crecencio Mc, MD  for consultation & management of chronic cough and chronic diarrhea Chronic cough: Patient has history of chronic GERD for which she takes omeprazole 20 mg daily.  Patient reports that she has been experiencing coughing spells at bedtime that wake her up from sleep.  She underwent esophagogram which revealed small 4 mm Zenker's diverticulum, no evidence of esophageal stricture.  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  Patient never had an upper endoscopy.  She states will refer to GI for further evaluation.  Patient denies difficulty swallowing  Chronic diarrhea: Patient states that she has been experiencing several loose bowel movements daily, associated with fecal urgency, incontinence.  This has been ongoing for last 1 year.  She does admit to drinking carbonated beverages, Pepsi which she has stopped temporarily.  She restarted back.  She has been gaining weight.  She denies any rectal bleeding.  She does report abdominal bloating.  Labs revealed B12 deficiency.  No evidence of anemia, chronic liver disease, TSH normal, no evidence of iron deficiency anemia.  Intrinsic factor antibodies negative.  NSAIDs: None  Antiplts/Anticoagulants/Anti thrombotics: None  GI Procedures: Colonoscopy in 2014 by Dr. Gustavo Lah, found to have left-sided diverticulosis  Past Medical History:  Diagnosis Date   Anxiety    Arthritis    lower back   Frequent headaches    stress  related   Grade I diastolic dysfunction  03/01/1447   Echo June 2019   High cholesterol    Hyperlipidemia    Hypertension    Hyperthyroidism    Migraines    Mild concentric left ventricular hypertrophy 05/17/2018   Echo June 2019   S/P hysterectomy 09/12/2014   Due to fibroids,    Thyroid disease     Past Surgical History:  Procedure Laterality Date   ABDOMINAL HYSTERECTOMY  1979   SHOULDER ARTHROSCOPY WITH ROTATOR CUFF REPAIR Left 09/14/2019   Procedure: SHOULDER ARTHROSCOPY WITH ROTATOR CUFF REPAIR;  Surgeon: Lovell Sheehan, MD;  Location: [facility];  Service: Orthopedics;  Laterality: Left;   VAGINAL HYSTERECTOMY  1979    Current Outpatient Medications:    acyclovir (ZOVIRAX) 400 MG tablet, Take 1 tablet (400 mg total) by mouth daily., Disp: 90 tablet, Rfl: 3   amLODipine (NORVASC) 5 MG tablet, Take 1 tablet (5 mg total) by mouth at bedtime., Disp: 90 tablet, Rfl: 3   atorvastatin (LIPITOR) 40 MG tablet, TAKE 1 TABLET(40 MG) BY MOUTH AT BEDTIME, Disp: 90 tablet, Rfl: 1   citalopram (CELEXA) 20 MG tablet, TAKE 1 TABLET(20 MG) BY MOUTH DAILY, Disp: 90 tablet, Rfl: 3   ergocalciferol (DRISDOL) 1.25 MG (50000 UT) capsule, Take 1 capsule (50,000 Units total) by mouth once a week., Disp: 12 capsule, Rfl: 0   estradiol (ESTRACE VAGINAL) 0.1 MG/GM vaginal cream, Apply 0.5mg  (pea-sized amount)  just inside the vaginal introitus with a finger-tip on Monday, Wednesday and Friday nights., Disp: 30 g, Rfl: 12   hyoscyamine (  ANASPAZ) 0.125 MG TBDP disintergrating tablet, Place 1 tablet (0.125 mg total) under the tongue every 6 (six) hours as needed (esophageal spasm)., Disp: 30 tablet, Rfl: 0   levothyroxine (SYNTHROID) 112 MCG tablet, Take 1 tablet (112 mcg total) by mouth daily before breakfast., Disp: 90 tablet, Rfl: 3   meloxicam (MOBIC) 15 MG tablet, Take 1 tablet by mouth daily., Disp: , Rfl:    omeprazole (PRILOSEC) 20 MG capsule, Take 20 mg by mouth daily., Disp: , Rfl:    Sod Picosulfate-Mag Ox-Cit Acd (CLENPIQ)  10-3.5-12 MG-GM -GM/160ML SOLN, Take 320 mLs by mouth as directed., Disp: 320 mL, Rfl: 0    Family History  Problem Relation Age of Onset   Hypertension Mother    Alzheimer's disease Mother    Cancer Father 76       prostate cancer   Cancer Sister 61       breast cancer   Breast cancer Sister 44   Aneurysm Sister    Diabetes Brother        type 2   Heart disease Sister        cabg x 4, tobacco abuse   Diabetes Sister        type 2   Lung cancer Nephew      Social History   Tobacco Use   Smoking status: Never   Smokeless tobacco: Never   Tobacco comments:    smoking cessation materials not required  Vaping Use   Vaping Use: Never used  Substance Use Topics   Alcohol use: Yes    Alcohol/week: 1.0 standard drink    Types: 1 Glasses of wine per week    Comment: rare   Drug use: No    Allergies as of 11/07/2021   (No Known Allergies)    Review of Systems:    All systems reviewed and negative except where noted in HPI.   Physical Exam:  BP 127/79 (BP Location: Left Arm, Patient Position: Sitting, Cuff Size: Large)    Pulse 65    Temp 97.9 F (36.6 C) (Temporal)    Ht 5\' 5"  (1.651 m)    Wt 226 lb 9.6 oz (102.8 kg)    BMI 37.71 kg/m  No LMP recorded. Patient has had a hysterectomy.  General:   Alert,  Well-developed, well-nourished, pleasant and cooperative in NAD Head:  Normocephalic and atraumatic. Eyes:  Sclera clear, no icterus.   Conjunctiva pink. Ears:  Normal auditory acuity. Nose:  No deformity, discharge, or lesions. Mouth:  No deformity or lesions,oropharynx pink & moist. Neck:  Supple; no masses or thyromegaly. Lungs:  Respirations even and unlabored.  Clear throughout to auscultation.   No wheezes, crackles, or rhonchi. No acute distress. Heart:  Regular rate and rhythm; no murmurs, clicks, rubs, or gallops. Abdomen:  Normal bowel sounds. Soft, obese, non-tender and moderately distended without masses, hepatosplenomegaly or hernias noted.  No guarding  or rebound tenderness.   Rectal: Not performed Msk:  Symmetrical without gross deformities. Good, equal movement & strength bilaterally. Pulses:  Normal pulses noted. Extremities:  No clubbing or edema.  No cyanosis. Neurologic:  Alert and oriented x3;  grossly normal neurologically. Skin:  Intact without significant lesions or rashes. No jaundice. Psych:  Alert and cooperative. Normal mood and affect.  Imaging Studies: No abdominal imaging except for barium esophagogram  Assessment and Plan:   Julie Perez is a 75 y.o. pleasant Caucasian female with history of hypothyroidism, hypertension is seen in consultation for chronic GERD,  chronic cough and chronic diarrhea  Chronic GERD and chronic cough Discussed about antireflux lifestyle Continue omeprazole 20 mg daily Barium esophagogram revealed small Zenker's diverticulum and severe spasm in the distal half of the esophagus Recommend EGD for further evaluation, recommend esophageal, gastric and duodenal biopsies  Chronic diarrhea without bleeding Recommend colonoscopy with possible TI evaluation and random colon biopsies Strongly advised patient to avoid all the carbonated beverages, sweeteners and sugary drinks.  Also, discussed with patient about possible lactose intolerance If colonoscopy is unremarkable, check pancreatic fecal elastase levels   Follow up based on the above work-up   Cephas Darby, MD

## 2021-11-21 ENCOUNTER — Ambulatory Visit: Payer: Medicare Other

## 2021-11-21 ENCOUNTER — Telehealth: Payer: Self-pay | Admitting: Internal Medicine

## 2021-11-21 ENCOUNTER — Ambulatory Visit: Payer: 59

## 2021-11-21 MED ORDER — HYOSCYAMINE SULFATE 0.125 MG PO TBDP
0.1250 mg | ORAL_TABLET | Freq: Four times a day (QID) | ORAL | 0 refills | Status: DC | PRN
Start: 2021-11-21 — End: 2022-01-09

## 2021-11-21 NOTE — Telephone Encounter (Signed)
Pt need refill on hyoscyamine (ANASPAZ) sent to [facility] in [place]. Pt has 8 pills left.

## 2021-11-21 NOTE — Telephone Encounter (Signed)
Medication has been refilled.

## 2021-11-28 ENCOUNTER — Other Ambulatory Visit: Payer: Self-pay

## 2021-11-28 ENCOUNTER — Ambulatory Visit (INDEPENDENT_AMBULATORY_CARE_PROVIDER_SITE_OTHER): Payer: Medicare Other

## 2021-11-28 DIAGNOSIS — E538 Deficiency of other specified B group vitamins: Secondary | ICD-10-CM | POA: Diagnosis not present

## 2021-11-28 MED ORDER — CYANOCOBALAMIN 1000 MCG/ML IJ SOLN
1000.0000 ug | Freq: Once | INTRAMUSCULAR | Status: AC
  Administered 2021-11-28: 1000 ug via INTRAMUSCULAR

## 2021-11-28 NOTE — Addendum Note (Signed)
Addended by: Fulton Mole D on: 11/28/2021 10:08 AM   Modules accepted: Orders

## 2021-11-28 NOTE — Progress Notes (Signed)
Julie Perez presents today for injection per MD orders. B12 injection administered IM in right Upper Arm. Administration without incident. Patient tolerated well. Jeyda Siebel,cma

## 2021-11-28 NOTE — Addendum Note (Signed)
Addended by: Fulton Mole D on: 11/28/2021 09:50 AM   Modules accepted: Orders

## 2021-12-05 ENCOUNTER — Other Ambulatory Visit: Payer: Self-pay | Admitting: Internal Medicine

## 2021-12-10 ENCOUNTER — Encounter: Payer: Self-pay | Admitting: Gastroenterology

## 2021-12-10 ENCOUNTER — Encounter
Admission: RE | Disposition: A | Discharge status: Home / Self Care | Payer: Self-pay | Source: Home / Self Care | Attending: Gastroenterology

## 2021-12-10 ENCOUNTER — Ambulatory Visit: Payer: Medicare Other | Admitting: Anesthesiology

## 2021-12-10 ENCOUNTER — Ambulatory Visit
Admission: RE | Admit: 2021-12-10 | Discharge: 2021-12-10 | Disposition: A | Discharge status: Home / Self Care | Payer: Medicare Other | Attending: Gastroenterology | Admitting: Gastroenterology

## 2021-12-10 DIAGNOSIS — E039 Hypothyroidism, unspecified: Secondary | ICD-10-CM | POA: Diagnosis not present

## 2021-12-10 DIAGNOSIS — K5730 Diverticulosis of large intestine without perforation or abscess without bleeding: Secondary | ICD-10-CM | POA: Diagnosis not present

## 2021-12-10 DIAGNOSIS — K529 Noninfective gastroenteritis and colitis, unspecified: Secondary | ICD-10-CM | POA: Diagnosis not present

## 2021-12-10 DIAGNOSIS — G4730 Sleep apnea, unspecified: Secondary | ICD-10-CM | POA: Diagnosis not present

## 2021-12-10 DIAGNOSIS — D126 Benign neoplasm of colon, unspecified: Secondary | ICD-10-CM | POA: Diagnosis not present

## 2021-12-10 DIAGNOSIS — R197 Diarrhea, unspecified: Secondary | ICD-10-CM | POA: Diagnosis not present

## 2021-12-10 DIAGNOSIS — K2100 Gastro-esophageal reflux disease with esophagitis, without bleeding: Secondary | ICD-10-CM | POA: Insufficient documentation

## 2021-12-10 DIAGNOSIS — I10 Essential (primary) hypertension: Secondary | ICD-10-CM | POA: Insufficient documentation

## 2021-12-10 DIAGNOSIS — Z6836 Body mass index (BMI) 36.0-36.9, adult: Secondary | ICD-10-CM | POA: Diagnosis not present

## 2021-12-10 DIAGNOSIS — K219 Gastro-esophageal reflux disease without esophagitis: Secondary | ICD-10-CM | POA: Diagnosis not present

## 2021-12-10 DIAGNOSIS — K2960 Other gastritis without bleeding: Secondary | ICD-10-CM | POA: Diagnosis not present

## 2021-12-10 DIAGNOSIS — K6389 Other specified diseases of intestine: Secondary | ICD-10-CM | POA: Diagnosis not present

## 2021-12-10 DIAGNOSIS — K635 Polyp of colon: Secondary | ICD-10-CM | POA: Diagnosis not present

## 2021-12-10 DIAGNOSIS — R053 Chronic cough: Secondary | ICD-10-CM | POA: Diagnosis not present

## 2021-12-10 DIAGNOSIS — E669 Obesity, unspecified: Secondary | ICD-10-CM | POA: Insufficient documentation

## 2021-12-10 DIAGNOSIS — K633 Ulcer of intestine: Secondary | ICD-10-CM

## 2021-12-10 DIAGNOSIS — K449 Diaphragmatic hernia without obstruction or gangrene: Secondary | ICD-10-CM | POA: Insufficient documentation

## 2021-12-10 HISTORY — PX: ESOPHAGOGASTRODUODENOSCOPY: SHX5428

## 2021-12-10 HISTORY — PX: COLONOSCOPY WITH PROPOFOL: SHX5780

## 2021-12-10 SURGERY — COLONOSCOPY WITH PROPOFOL
Anesthesia: General

## 2021-12-10 MED ORDER — PHENYLEPHRINE HCL-NACL 20-0.9 MG/250ML-% IV SOLN
INTRAVENOUS | Status: AC
  Filled 2021-12-10: qty 250

## 2021-12-10 MED ORDER — PROPOFOL 500 MG/50ML IV EMUL
INTRAVENOUS | Status: AC
  Filled 2021-12-10: qty 50

## 2021-12-10 MED ORDER — LIDOCAINE HCL (CARDIAC) PF 100 MG/5ML IV SOSY
PREFILLED_SYRINGE | INTRAVENOUS | Status: DC | PRN
Start: 2021-12-10 — End: 2021-12-10
  Administered 2021-12-10: 100 mg via INTRAVENOUS

## 2021-12-10 MED ORDER — PROPOFOL 500 MG/50ML IV EMUL
INTRAVENOUS | Status: DC | PRN
  Administered 2021-12-10: 100 ug/kg/min via INTRAVENOUS

## 2021-12-10 MED ORDER — SODIUM CHLORIDE 0.9 % IV SOLN: INTRAVENOUS | Status: DC

## 2021-12-10 NOTE — Anesthesia Preprocedure Evaluation (Signed)
Anesthesia Evaluation  Patient identified by MRN, date of birth, ID band Patient awake    Reviewed: Allergy & Precautions, NPO status , Patient's Chart, lab work & pertinent test results  History of Anesthesia Complications Negative for: history of anesthetic complications  Airway Mallampati: III   Neck ROM: Full    Dental no notable dental hx.    Pulmonary sleep apnea ,    Pulmonary exam normal breath sounds clear to auscultation       Cardiovascular hypertension, Normal cardiovascular exam Rhythm:Regular Rate:Normal     Neuro/Psych  Headaches,    GI/Hepatic negative GI ROS,   Endo/Other  Hypothyroidism Obesity   Renal/GU negative Renal ROS     Musculoskeletal  (+) Arthritis ,   Abdominal   Peds  Hematology negative hematology ROS (+)   Anesthesia Other Findings   Reproductive/Obstetrics                             Anesthesia Physical Anesthesia Plan  ASA: 2  Anesthesia Plan: General   Post-op Pain Management:    Induction: Intravenous  PONV Risk Score and Plan: 3 and Propofol infusion, TIVA and Treatment may vary due to age or medical condition  Airway Management Planned: Natural Airway  Additional Equipment:   Intra-op Plan:   Post-operative Plan:   Informed Consent: I have reviewed the patients History and Physical, chart, labs and discussed the procedure including the risks, benefits and alternatives for the proposed anesthesia with the patient or authorized representative who has indicated his/her understanding and acceptance.       Plan Discussed with: CRNA  Anesthesia Plan Comments: (LMA/GETA backup discussed.  Patient consented for risks of anesthesia including but not limited to:  - adverse reactions to medications - damage to eyes, teeth, lips or other oral mucosa - nerve damage due to positioning  - sore throat or hoarseness - damage to heart, brain,  nerves, lungs, other parts of body or loss of life  Informed patient about role of CRNA in peri- and intra-operative care.  Patient voiced understanding.)        Anesthesia Quick Evaluation

## 2021-12-10 NOTE — Anesthesia Postprocedure Evaluation (Signed)
Anesthesia Post Note  Patient: Julie Perez  Procedure(s) Performed: COLONOSCOPY WITH PROPOFOL ESOPHAGOGASTRODUODENOSCOPY (EGD)  Patient location during evaluation: PACU Anesthesia Type: General Level of consciousness: awake and alert, oriented and patient cooperative Pain management: pain level controlled Vital Signs Assessment: post-procedure vital signs reviewed and stable Respiratory status: spontaneous breathing, nonlabored ventilation and respiratory function stable Cardiovascular status: blood pressure returned to baseline and stable Postop Assessment: adequate PO intake Anesthetic complications: no   No notable events documented.   Last Vitals:  Vitals:   12/10/21 0912 12/10/21 0932  BP: 114/69 133/88  Pulse: 62 66  Resp: 19 14  Temp:    SpO2: 95% 100%    Last Pain:  Vitals:   12/10/21 0912  TempSrc:   PainSc: 0-No pain                 Darrin Nipper

## 2021-12-10 NOTE — Op Note (Signed)
[facility] Gastroenterology Patient Name: Julie Perez Procedure Date: 12/10/2021 8:30 AM MRN: [id number] Account #: [id number] Date of Birth: 1946/11/01 Admit Type: Outpatient Age: 76 Room: [facility] ENDO ROOM 4 Gender: Female Note Status: Finalized Instrument Name: Colonoscope [id number] Procedure:             Colonoscopy Indications:           Last colonoscopy: April 2014, Chronic diarrhea,                         Clinically significant diarrhea of unexplained origin Providers:             Lin Landsman MD, MD Referring MD:          Deborra Medina, MD (Referring MD) Medicines:             General Anesthesia Complications:         No immediate complications. Estimated blood loss: None. Procedure:             Pre-Anesthesia Assessment:                        - Prior to the procedure, a History and Physical was                         performed, and patient medications and allergies were                         reviewed. The patient is competent. The risks and                         benefits of the procedure and the sedation options and                         risks were discussed with the patient. All questions                         were answered and informed consent was obtained.                         Patient identification and proposed procedure were                         verified by the physician, the nurse, the                         anesthesiologist, the anesthetist and the technician                         in the pre-procedure area in the procedure room in the                         endoscopy suite. Mental Status Examination: alert and                         oriented. Airway Examination: normal oropharyngeal                         airway and neck mobility. Respiratory Examination:  clear to auscultation. CV Examination: normal.                         Prophylactic Antibiotics: The patient does not require                          prophylactic antibiotics. Prior Anticoagulants: The                         patient has taken no previous anticoagulant or                         antiplatelet agents. ASA Grade Assessment: II - A                         patient with mild systemic disease. After reviewing                         the risks and benefits, the patient was deemed in                         satisfactory condition to undergo the procedure. The                         anesthesia plan was to use general anesthesia.                         Immediately prior to administration of medications,                         the patient was re-assessed for adequacy to receive                         sedatives. The heart rate, respiratory rate, oxygen                         saturations, blood pressure, adequacy of pulmonary                         ventilation, and response to care were monitored                         throughout the procedure. The physical status of the                         patient was re-assessed after the procedure.                        After obtaining informed consent, the colonoscope was                         passed under direct vision. Throughout the procedure,                         the patient's blood pressure, pulse, and oxygen                         saturations were monitored continuously. The  Colonoscope was introduced through the anus and                         advanced to the 10 cm into the ileum. The colonoscopy                         was performed without difficulty. The patient                         tolerated the procedure well. The quality of the bowel                         preparation was evaluated using the BBPS [facility] Bowel                         Preparation Scale) with scores of: Right Colon = 3,                         Transverse Colon = 3 and Left Colon = 3 (entire mucosa                         seen well with no residual staining, small fragments                          of stool or opaque liquid). The total BBPS score                         equals 9. Findings:      The perianal and digital rectal examinations were normal. Pertinent       negatives include normal sphincter tone and no palpable rectal lesions.      The terminal ileum contained multiple scattered non-bleeding aphthae. No       stigmata of recent bleeding were seen. Biopsies were taken with a cold       forceps for histology. Estimated blood loss: none.      Multiple small and large-mouthed diverticula were found in the       recto-sigmoid colon and sigmoid colon. There was no evidence of       diverticular bleeding.      Normal mucosa was found in the left colon and in the right colon.       Biopsies for histology were taken with a cold forceps from the entire       colon for evaluation of microscopic colitis.      A diminutive polyp was found in the ascending colon. The polyp was       sessile. The polyp was removed with a cold biopsy forceps. Resection and       retrieval were complete.      The retroflexed view of the distal rectum and anal verge was normal and       showed no anal or rectal abnormalities. Impression:            - Aphtha in the terminal ileum. Biopsied.                        - Severe diverticulosis in the recto-sigmoid colon and  in the sigmoid colon. There was no evidence of                         diverticular bleeding.                        - Normal mucosa in the left colon and in the right                         colon. Biopsied.                        - One diminutive polyp in the ascending colon, removed                         with a cold biopsy forceps. Resected and retrieved.                        - The distal rectum and anal verge are normal on                         retroflexion view. Recommendation:        - Discharge patient to home (with escort).                        - Resume previous diet today.                         - Continue present medications.                        - Await pathology results.                        - Return to my office as previously scheduled. Procedure Code(s):     --- Professional ---                        [phone], Colonoscopy, flexible; with biopsy, single or                         multiple Diagnosis Code(s):     --- Professional ---                        K63.89, Other specified diseases of intestine                        K63.5, Polyp of colon                        K52.9, Noninfective gastroenteritis and colitis,                         unspecified                        R19.7, Diarrhea, unspecified                        K57.30, Diverticulosis of large intestine without  perforation or abscess without bleeding CPT copyright 2019 American Medical Association. All rights reserved. The codes documented in this report are preliminary and upon coder review may  be revised to meet current compliance requirements. Dr. Ulyess Mort Lin Landsman MD, MD 12/10/2021 9:08:40 AM This report has been signed electronically. Number of Addenda: 0 Note Initiated On: 12/10/2021 8:30 AM Scope Withdrawal Time: 0 hours 13 minutes 43 seconds  Total Procedure Duration: 0 hours 16 minutes 58 seconds  Estimated Blood Loss:  Estimated blood loss: none.      [facility]

## 2021-12-10 NOTE — Transfer of Care (Signed)
Immediate Anesthesia Transfer of Care Note  Patient: Julie Perez  Procedure(s) Performed: COLONOSCOPY WITH PROPOFOL ESOPHAGOGASTRODUODENOSCOPY (EGD)  Patient Location: PACU and Endoscopy Unit  Anesthesia Type:MAC  Level of Consciousness: sedated  Airway & Oxygen Therapy: Patient Spontanous Breathing  Post-op Assessment: Report given to RN  Post vital signs: Reviewed and stable  Last Vitals:  Vitals Value Taken Time  BP 114/69 12/10/21 0913  Temp    Pulse 63 12/10/21 0917  Resp 14 12/10/21 0917  SpO2 97 % 12/10/21 0917  Vitals shown include unvalidated device data.  Last Pain:  Vitals:   12/10/21 0912  TempSrc:   PainSc: 0-No pain         Complications: No notable events documented.

## 2021-12-10 NOTE — Op Note (Signed)
[facility] Gastroenterology Patient Name: Julie Perez Procedure Date: 12/10/2021 8:31 AM MRN: [id number] Account #: [id number] Date of Birth: 22-Nov-1946 Admit Type: Outpatient Age: 76 Room: [facility] ENDO ROOM 4 Gender: Female Note Status: Finalized Instrument Name: Upper Endoscope [id number] Procedure:             Upper GI endoscopy Indications:           Follow-up of gastro-esophageal reflux disease,                         Esophageal reflux symptoms that persist despite                         appropriate therapy, Chronic cough, Diarrhea Providers:             Lin Landsman MD, MD Referring MD:          Deborra Medina, MD (Referring MD) Medicines:             General Anesthesia Complications:         No immediate complications. Estimated blood loss: None. Procedure:             Pre-Anesthesia Assessment:                        - Prior to the procedure, a History and Physical was                         performed, and patient medications and allergies were                         reviewed. The patient is competent. The risks and                         benefits of the procedure and the sedation options and                         risks were discussed with the patient. All questions                         were answered and informed consent was obtained.                         Patient identification and proposed procedure were                         verified by the physician, the nurse, the                         anesthesiologist, the anesthetist and the technician                         in the pre-procedure area in the procedure room in the                         endoscopy suite. Mental Status Examination: alert and                         oriented. Airway Examination: normal oropharyngeal  airway and neck mobility. Respiratory Examination:                         clear to auscultation. CV Examination: normal.                          Prophylactic Antibiotics: The patient does not require                         prophylactic antibiotics. Prior Anticoagulants: The                         patient has taken no previous anticoagulant or                         antiplatelet agents. ASA Grade Assessment: II - A                         patient with mild systemic disease. After reviewing                         the risks and benefits, the patient was deemed in                         satisfactory condition to undergo the procedure. The                         anesthesia plan was to use general anesthesia.                         Immediately prior to administration of medications,                         the patient was re-assessed for adequacy to receive                         sedatives. The heart rate, respiratory rate, oxygen                         saturations, blood pressure, adequacy of pulmonary                         ventilation, and response to care were monitored                         throughout the procedure. The physical status of the                         patient was re-assessed after the procedure.                        After obtaining informed consent, the endoscope was                         passed under direct vision. Throughout the procedure,                         the patient's blood pressure, pulse, and oxygen  saturations were monitored continuously. The Endoscope                         was introduced through the mouth, and advanced to the                         second part of duodenum. The upper GI endoscopy was                         accomplished without difficulty. The patient tolerated                         the procedure well. Findings:      The duodenal bulb and second portion of the duodenum were normal.       Biopsies were taken with a cold forceps for histology.      A small hiatal hernia was present.      The entire examined stomach was normal. Biopsies were  taken with a cold       forceps for Helicobacter pylori testing.      Esophagogastric landmarks were identified: the gastroesophageal junction       was found at 37 cm from the incisors.      The gastroesophageal junction and examined esophagus were normal.       Biopsies were taken with a cold forceps for histology. Impression:            - Normal duodenal bulb and second portion of the                         duodenum. Biopsied.                        - Small hiatal hernia.                        - Normal stomach. Biopsied.                        - Esophagogastric landmarks identified.                        - Normal gastroesophageal junction and esophagus.                         Biopsied. Recommendation:        - Await pathology results.                        - Follow an antireflux regimen.                        - Use Prilosec (omeprazole) 40 mg PO daily.                        - Return to my office as previously scheduled.                        - Proceed with colonoscopy as scheduled                        See colonoscopy report Procedure Code(s):     ---  Professional ---                        [phone], Esophagogastroduodenoscopy, flexible,                         transoral; with biopsy, single or multiple Diagnosis Code(s):     --- Professional ---                        K44.9, Diaphragmatic hernia without obstruction or                         gangrene                        K21.9, Gastro-esophageal reflux disease without                         esophagitis                        R05, Cough                        R19.7, Diarrhea, unspecified CPT copyright 2019 American Medical Association. All rights reserved. The codes documented in this report are preliminary and upon coder review may  be revised to meet current compliance requirements. Dr. Ulyess Mort Lin Landsman MD, MD 12/10/2021 8:47:30 AM This report has been signed electronically. Number of Addenda: 0 Note  Initiated On: 12/10/2021 8:31 AM Estimated Blood Loss:  Estimated blood loss: none.      [facility]

## 2021-12-10 NOTE — H&P (Signed)
Cephas Darby, MD [address]  [place]  [place], [place] [address]  Main: [phone]  Fax: [phone] Pager: [phone]  Primary Care Physician:  Crecencio Mc, MD Primary Gastroenterologist:  Dr. Cephas Darby  Pre-Procedure History & Physical: HPI:  Julie Perez is a 76 y.o. female is here for an endoscopy and colonoscopy.   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 Authorizing Provider  amLODipine (NORVASC) 5 MG tablet Take 1 tablet (5 mg total) by mouth at bedtime. 01/17/21  Yes Crecencio Mc, MD  atorvastatin (LIPITOR) 40 MG tablet TAKE 1 TABLET(40 MG) BY MOUTH AT BEDTIME 07/09/21  Yes Crecencio Mc, MD  acyclovir (ZOVIRAX) 400 MG tablet Take 1 tablet (400 mg total) by mouth daily. Patient not taking: Reported on 12/10/2021 01/17/21   Crecencio Mc, MD  citalopram (CELEXA) 20 MG tablet TAKE 1 TABLET(20 MG) BY MOUTH DAILY 07/09/21   Crecencio Mc, MD  ergocalciferol (DRISDOL) 1.25 MG (50000 UT) capsule Take 1 capsule (50,000 Units total) by mouth once a week. Patient not taking: Reported on 12/10/2021 06/29/21   Crecencio Mc, MD  estradiol (ESTRACE VAGINAL) 0.1 MG/GM  vaginal cream Apply 0.5mg  (pea-sized amount)  just inside the vaginal introitus with a finger-tip on Monday, Wednesday and Friday nights. Patient not taking: Reported on 12/10/2021 01/17/21   Crecencio Mc, MD  hyoscyamine (ANASPAZ) 0.125 MG TBDP disintergrating tablet Place 1 tablet (0.125 mg total) under the tongue every 6 (six) hours as needed (esophageal spasm). 11/21/21   Crecencio Mc, MD  levothyroxine (SYNTHROID) 112 MCG tablet Take 1 tablet (112 mcg total) by mouth daily before breakfast. 01/17/21   Crecencio Mc, MD  meloxicam (MOBIC) 15 MG tablet Take 1 tablet by mouth daily.    [provider]  omeprazole (PRILOSEC) 20 MG capsule TAKE 1 CAPSULE(20 MG) BY MOUTH DAILY 12/05/21   Crecencio Mc, MD  Sod Picosulfate-Mag Ox-Cit Acd (CLENPIQ) 10-3.5-12 MG-GM -GM/160ML SOLN Take 320 mLs by mouth as directed. 11/07/21   Lin Landsman, MD    Allergies as of 11/10/2021   (No Known Allergies)    Family History  Problem Relation Age of Onset   Hypertension Mother    Alzheimer's disease Mother    Cancer Father 93       prostate cancer   Cancer Sister 6       breast cancer   Breast cancer Sister 12   Aneurysm Sister    Diabetes Brother        type 2   Heart disease  Sister        cabg x 4, tobacco abuse   Diabetes Sister        type 2   Lung cancer Nephew     Social History   Socioeconomic History   Marital status: Married    Spouse name: Rip Harbour   Number of children: 2   Years of education: Not on file   Highest education level: 12th grade  Occupational History   Occupation: Retired  Tobacco Use   Smoking status: Never   Smokeless tobacco: Never   Tobacco comments:    smoking cessation materials not required  Vaping Use   Vaping Use: Never used  Substance and Sexual Activity   Alcohol use: Yes    Alcohol/week: 1.0 standard drink    Types: 1 Glasses of wine per week    Comment: rare   Drug use: No   Sexual activity: Not Currently  Other Topics  Concern   Not on file  Social History Narrative   Pt lives alone. Husband lives out of state.   Social Determinants of Health   Financial Resource Strain: Not on file  Food Insecurity: Not on file  Transportation Needs: Not on file  Physical Activity: Not on file  Stress: Not on file  Social Connections: Not on file  Intimate Partner Violence: Not on file    Review of Systems: See HPI, otherwise negative ROS  Physical Exam: BP (!) 148/82    Pulse 73    Temp (!) 96.7 F (35.9 C) (Temporal)    Resp 18    Ht 5\' 5"  (1.651 m)    Wt 99.8 kg    SpO2 98%    BMI 36.61 kg/m  General:   Alert,  pleasant and cooperative in NAD Head:  Normocephalic and atraumatic. Neck:  Supple; no masses or thyromegaly. Lungs:  Clear throughout to auscultation.    Heart:  Regular rate and rhythm. Abdomen:  Soft, nontender and nondistended. Normal bowel sounds, without guarding, and without rebound.   Neurologic:  Alert and  oriented x4;  grossly normal neurologically.  Impression/Plan: Julie Perez is here for an endoscopy and colonoscopy to be performed for Chronic GERD and chronic cough, Chronic diarrhea without bleeding  Risks, benefits, limitations, and alternatives regarding  endoscopy and colonoscopy have been reviewed with the patient.  Questions have been answered.  All parties agreeable.   Sherri Sear, MD  12/10/2021, 7:39 AM

## 2021-12-11 ENCOUNTER — Encounter: Payer: Self-pay | Admitting: Gastroenterology

## 2021-12-15 ENCOUNTER — Encounter: Payer: Self-pay | Admitting: Gastroenterology

## 2021-12-15 LAB — SURGICAL PATHOLOGY

## 2021-12-25 DIAGNOSIS — L578 Other skin changes due to chronic exposure to nonionizing radiation: Secondary | ICD-10-CM | POA: Diagnosis not present

## 2021-12-25 DIAGNOSIS — D485 Neoplasm of uncertain behavior of skin: Secondary | ICD-10-CM | POA: Diagnosis not present

## 2021-12-25 DIAGNOSIS — L570 Actinic keratosis: Secondary | ICD-10-CM | POA: Diagnosis not present

## 2021-12-25 DIAGNOSIS — Z872 Personal history of diseases of the skin and subcutaneous tissue: Secondary | ICD-10-CM | POA: Diagnosis not present

## 2021-12-25 DIAGNOSIS — L821 Other seborrheic keratosis: Secondary | ICD-10-CM | POA: Diagnosis not present

## 2021-12-26 ENCOUNTER — Telehealth: Payer: Self-pay | Admitting: Internal Medicine

## 2021-12-26 NOTE — Telephone Encounter (Signed)
Copied from [place] [id number]. Topic: Medicare AWV >> Dec 26, 2021  2:02 PM Harris-Coley, Hannah Beat wrote: Reason for CRM: Left message for patient to schedule Annual Wellness Visit.  Please schedule with Nurse Health Advisor Denisa O'Brien-Blaney, LPN at [facility].  Please call [phone] ask for [facility]

## 2022-01-02 ENCOUNTER — Other Ambulatory Visit: Payer: Self-pay

## 2022-01-02 ENCOUNTER — Ambulatory Visit (INDEPENDENT_AMBULATORY_CARE_PROVIDER_SITE_OTHER): Payer: Medicare Other | Admitting: *Deleted

## 2022-01-02 DIAGNOSIS — E538 Deficiency of other specified B group vitamins: Secondary | ICD-10-CM

## 2022-01-02 MED ORDER — CYANOCOBALAMIN 1000 MCG/ML IJ SOLN
1000.0000 ug | Freq: Once | INTRAMUSCULAR | Status: AC
  Administered 2022-01-02: 1000 ug via INTRAMUSCULAR

## 2022-01-02 NOTE — Progress Notes (Signed)
Pt arrived to office for monthly B12 injection, given in L deltoid. Pt tolerated injection well and voiced no concerns.

## 2022-01-04 ENCOUNTER — Other Ambulatory Visit: Payer: Self-pay | Admitting: Internal Medicine

## 2022-01-04 DIAGNOSIS — E890 Postprocedural hypothyroidism: Secondary | ICD-10-CM

## 2022-01-04 DIAGNOSIS — I10 Essential (primary) hypertension: Secondary | ICD-10-CM

## 2022-01-05 ENCOUNTER — Other Ambulatory Visit: Payer: Self-pay

## 2022-01-05 DIAGNOSIS — E785 Hyperlipidemia, unspecified: Secondary | ICD-10-CM

## 2022-01-05 MED ORDER — ATORVASTATIN CALCIUM 40 MG PO TABS: ORAL_TABLET | ORAL | 3 refills | Status: DC

## 2022-01-09 ENCOUNTER — Telehealth: Payer: Self-pay | Admitting: Internal Medicine

## 2022-01-09 MED ORDER — HYOSCYAMINE SULFATE 0.125 MG PO TBDP: 0.1250 mg | ORAL_TABLET | Freq: Four times a day (QID) | ORAL | 0 refills | Status: DC | PRN

## 2022-01-09 NOTE — Telephone Encounter (Signed)
Pt stated someone called her but she missed the call. Pt think it was about her medication

## 2022-01-09 NOTE — Telephone Encounter (Signed)
Pt called in requesting refill on medication (hyoscyamine (ANASPAZ) 0.125 MG TBDP disintergrating tablet). Pt requesting callback

## 2022-01-12 NOTE — Telephone Encounter (Signed)
Spoke with pt and she is aware that medication has been sent in.

## 2022-01-30 ENCOUNTER — Other Ambulatory Visit: Payer: Self-pay

## 2022-01-30 ENCOUNTER — Ambulatory Visit (INDEPENDENT_AMBULATORY_CARE_PROVIDER_SITE_OTHER): Payer: Medicare Other | Admitting: *Deleted

## 2022-01-30 DIAGNOSIS — E538 Deficiency of other specified B group vitamins: Secondary | ICD-10-CM | POA: Diagnosis not present

## 2022-01-30 MED ORDER — CYANOCOBALAMIN 1000 MCG/ML IJ SOLN
1000.0000 ug | Freq: Once | INTRAMUSCULAR | Status: AC
  Administered 2022-01-30: 1000 ug via INTRAMUSCULAR

## 2022-01-30 NOTE — Progress Notes (Signed)
Patient presented for B 12 injection to left deltoid, patient voiced no concerns nor showed any signs of distress during injection. 

## 2022-02-02 DIAGNOSIS — L570 Actinic keratosis: Secondary | ICD-10-CM | POA: Diagnosis not present

## 2022-02-03 ENCOUNTER — Other Ambulatory Visit: Payer: Self-pay | Admitting: Internal Medicine

## 2022-02-18 ENCOUNTER — Other Ambulatory Visit: Payer: Self-pay | Admitting: Internal Medicine

## 2022-03-04 ENCOUNTER — Ambulatory Visit (INDEPENDENT_AMBULATORY_CARE_PROVIDER_SITE_OTHER): Payer: Medicare Other

## 2022-03-04 ENCOUNTER — Other Ambulatory Visit: Payer: Self-pay

## 2022-03-04 DIAGNOSIS — E538 Deficiency of other specified B group vitamins: Secondary | ICD-10-CM | POA: Diagnosis not present

## 2022-03-04 MED ORDER — HYOSCYAMINE SULFATE 0.125 MG PO TBDP: 0.1250 mg | ORAL_TABLET | Freq: Four times a day (QID) | ORAL | 0 refills | Status: DC | PRN

## 2022-03-04 MED ORDER — CYANOCOBALAMIN 1000 MCG/ML IJ SOLN
1000.0000 ug | Freq: Once | INTRAMUSCULAR | Status: AC
  Administered 2022-03-04: 1000 ug via INTRAMUSCULAR

## 2022-03-04 MED ORDER — MELOXICAM 15 MG PO TABS: 15.0000 mg | ORAL_TABLET | Freq: Every day | ORAL | 0 refills | Status: DC

## 2022-03-04 NOTE — Progress Notes (Signed)
Patient presented for B 12 injection to right deltoid, patient voiced no concerns nor showed any signs of distress during injection. 

## 2022-04-03 ENCOUNTER — Ambulatory Visit (INDEPENDENT_AMBULATORY_CARE_PROVIDER_SITE_OTHER): Payer: Medicare Other

## 2022-04-03 VITALS — Ht 65.0 in | Wt 220.0 lb

## 2022-04-03 DIAGNOSIS — Z0000 Encounter for general adult medical examination without abnormal findings: Secondary | ICD-10-CM | POA: Diagnosis not present

## 2022-04-03 NOTE — Patient Instructions (Addendum)
?  Julie Perez , ?Thank you for taking time to come for your Medicare Wellness Visit. I appreciate your ongoing commitment to your health goals. Please review the following plan we discussed and let me know if I can assist you in the future.  ? ?These are the goals we discussed: ? Goals   ? ?  Increase lean proteins   ?  Low carb foods ? ?  ?  Increase physical activity   ?  Try to start with 3 days weekly.  ?Recommend to exercise for at least 150 minutes per week. ?  ? ?  ?  ?This is a list of the screening recommended for you and due dates:  ?Health Maintenance  ?Topic Date Due  ? COVID-19 Vaccine (4 - Booster for Pfizer series) 04/19/2022*  ? Zoster (Shingles) Vaccine (1 of 2) 07/04/2022*  ? Flu Shot  06/23/2022  ? Mammogram  10/29/2022  ? Tetanus Vaccine  05/20/2026  ? Colon Cancer Screening  12/10/2026  ? Pneumonia Vaccine  Completed  ? DEXA scan (bone density measurement)  Completed  ? Hepatitis C Screening: USPSTF Recommendation to screen - Ages 47-79 yo.  Completed  ? HPV Vaccine  Aged Out  ?*Topic was postponed. The date shown is not the original due date.  ?  ?

## 2022-04-03 NOTE — Progress Notes (Addendum)
Subjective:   Julie Perez is a 76 y.o. female who presents for Medicare Annual (Subsequent) preventive examination.  Review of Systems    No ROS.  Medicare Wellness Virtual Visit.  Visual/audio telehealth visit, UTA vital signs.   See social history for additional risk factors.   Cardiac Risk Factors include: advanced age (>102men, >57 women);hypertension     Objective:    Today's Vitals   04/03/22 0906  Weight: 220 lb (99.8 kg)  Height: 5\' 5"  (1.651 m)   Body mass index is 36.61 kg/m.     04/03/2022    9:14 AM 12/10/2021    7:31 AM 04/16/2020   11:14 AM 09/14/2019   11:12 AM 08/23/2018    2:05 PM 05/20/2017    2:25 PM 01/29/2017   11:03 AM  Advanced Directives  Does Patient Have a Medical Advance Directive? Yes Yes Yes Yes No Yes Yes  Type of Estate agent of [place];Living will Healthcare Power of [employer] Power of [employer] Power of Campus;Living will  Living will;Healthcare Power of [employer] Power of Attorney  Does patient want to make changes to medical advance directive? No - Patient declined   No - Patient declined  No - Patient declined   Copy of Healthcare Power of Attorney in Chart? No - copy requested  Yes - validated most recent copy scanned in chart (See row information) No - copy requested  No - copy requested   Would patient like information on creating a medical advance directive?     Yes (MAU/Ambulatory/Procedural Areas - Information given)      Current Medications (verified) Outpatient Encounter Medications as of 04/03/2022  Medication Sig   amLODipine (NORVASC) 5 MG tablet TAKE 1 TABLET(5 MG) BY MOUTH AT BEDTIME   atorvastatin (LIPITOR) 40 MG tablet TAKE 1 TABLET(40 MG) BY MOUTH AT BEDTIME   citalopram (CELEXA) 20 MG tablet TAKE 1 TABLET(20 MG) BY MOUTH DAILY   hyoscyamine (ANASPAZ) 0.125 MG TBDP disintergrating tablet Place 1 tablet (0.125 mg total) under the tongue every 6 (six) hours as needed  (esophageal spasm).   levothyroxine (SYNTHROID) 112 MCG tablet TAKE 1 TABLET BY MOUTH DAILY BEFORE BREAKFAST   meloxicam (MOBIC) 15 MG tablet Take 1 tablet (15 mg total) by mouth daily.   omeprazole (PRILOSEC) 20 MG capsule TAKE 1 CAPSULE(20 MG) BY MOUTH DAILY   [DISCONTINUED] acyclovir (ZOVIRAX) 400 MG tablet TAKE 1 TABLET(400 MG) BY MOUTH DAILY   [DISCONTINUED] Sod Picosulfate-Mag Ox-Cit Acd (CLENPIQ) 10-3.5-12 MG-GM -GM/160ML SOLN Take 320 mLs by mouth as directed.   No facility-administered encounter medications on file as of 04/03/2022.    Allergies (verified) Patient has no known allergies.   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   Echo June 2019   S/P hysterectomy 09/12/2014   Due to fibroids,    Thyroid disease    Past Surgical History:  Procedure Laterality Date   ABDOMINAL HYSTERECTOMY  1979   COLONOSCOPY WITH PROPOFOL N/A 12/10/2021   Procedure: COLONOSCOPY WITH PROPOFOL;  Surgeon: Toney Reil, MD;  Location: [facility] ENDOSCOPY;  Service: Gastroenterology;  Laterality: N/A;  ESOPHAGOGASTRODUODENOSCOPY N/A 12/10/2021   Procedure: ESOPHAGOGASTRODUODENOSCOPY (EGD);  Surgeon: Toney Reil, MD;  Location: [facility] ENDOSCOPY;  Service: Gastroenterology;  Laterality: N/A;   SHOULDER ARTHROSCOPY WITH ROTATOR CUFF REPAIR Left 09/14/2019   Procedure: SHOULDER ARTHROSCOPY WITH ROTATOR CUFF REPAIR;  Surgeon: Lyndle Herrlich, MD;  Location: [facility];  Service: Orthopedics;  Laterality: Left;   VAGINAL HYSTERECTOMY  1979   Family History  Problem Relation Age of Onset   Hypertension Mother    Alzheimer's disease Mother    Cancer Father 22       prostate cancer   Cancer  Sister 97       breast cancer   Breast cancer Sister 74   Aneurysm Sister    Diabetes Brother        type 2   Heart disease Sister        cabg x 4, tobacco abuse   Diabetes Sister        type 2   Lung cancer Nephew    Social History   Socioeconomic History   Marital status: Married    Spouse name: Alysia Penna   Number of children: 2   Years of education: Not on file   Highest education level: 12th grade  Occupational History   Occupation: Retired  Tobacco Use   Smoking status: Never   Smokeless tobacco: Never   Tobacco comments:    smoking cessation materials not required  Vaping Use   Vaping Use: Never used  Substance and Sexual Activity   Alcohol use: Yes    Alcohol/week: 1.0 standard drink    Types: 1 Glasses of wine per week    Comment: rare   Drug use: No   Sexual activity: Not Currently  Other Topics Concern   Not on file  Social History Narrative   Pt lives alone. Husband lives out of state.   Social Determinants of Health   Financial Resource Strain: Low Risk    Difficulty of Paying Living Expenses: Not hard at all  Food Insecurity: No Food Insecurity   Worried About Programme researcher, broadcasting/film/video in the Last Year: Never true   Ran Out of Food in the Last Year: Never true  Transportation Needs: No Transportation Needs   Lack of Transportation (Medical): No   Lack of Transportation (Non-Medical): No  Physical Activity: Not on file  Stress: No Stress Concern Present   Feeling of Stress : Not at all  Social Connections: Unknown   Frequency of Communication with Friends and Family: Not on file   Frequency of Social Gatherings with Friends and Family: Not on file   Attends Religious Services: Not on file   Active Member of Clubs or Organizations: Not on file   Attends Banker Meetings: Not on file   Marital Status: Married    Tobacco Counseling Counseling given: Not Answered Tobacco comments: smoking cessation materials not required   Clinical  Intake:  Pre-visit preparation completed: Yes        Diabetes: No  How often do you need to have someone help you when you read instructions, pamphlets, or other written materials from your doctor or pharmacy?: 1 - Never    Interpreter Needed?: No      Activities of Daily Living    04/03/2022    9:15 AM  In your present state of health, do you have any difficulty performing the following activities:  Hearing? 0  Vision? 0  Difficulty concentrating or making decisions? 0  Walking  or climbing stairs? 0  Dressing or bathing? 0  Doing errands, shopping? 0  Preparing Food and eating ? N  Using the Toilet? N  In the past six months, have you accidently leaked urine? Y  Comment Followed by Urology. Managed with brief at bedtime.  Do you have problems with loss of bowel control? Y  Comment Followed by GI. Managed with brief at bedtime.  Managing your Medications? N  Managing your Finances? N  Housekeeping or managing your Housekeeping? N    Patient Care Team: Sherlene Shams, MD as PCP - General (Internal Medicine) Dedra Skeens, PA-C as Consulting Physician (Orthopedic Surgery)  Indicate any recent Medical Services you may have received from other than [facility] providers in the past year (date may be approximate).     Assessment:   This is a routine wellness examination for [facility].  Virtual Visit via Telephone Note  I connected with  Julie Perez on 04/03/22 at  9:15 AM EDT by telephone and verified that I am speaking with the correct person using two identifiers.  Persons participating in the virtual visit: patient/Nurse Health Advisor   I discussed the limitations of performing an evaluation and management service by telehealth. We continued and completed visit with audio only. Some vital signs may be absent or patient reported.   Hearing/Vision screen Hearing Screening - Comments:: Patient is able to hear conversational tones without difficulty.  No issues  reported. Vision Screening - Comments:: Annual vision screenings done by [facility]  Dietary issues and exercise activities discussed: Current Exercise Habits: Home exercise routine, Intensity: Mild Regular diet Good water intake   Goals Addressed             This Visit's Progress    Increase physical activity       Try to start with 3 days weekly.  Recommend to exercise for at least 150 minutes per week.       Depression Screen    04/03/2022    9:12 AM 09/18/2021   11:06 AM 08/18/2021   11:00 AM 04/22/2021   10:22 AM 01/17/2021   11:05 AM 12/30/2020    8:47 AM 11/12/2020    2:11 PM  PHQ 2/9 Scores  PHQ - 2 Score 0 1 2 0 0 0 0    Fall Risk    04/03/2022    9:15 AM 09/18/2021   11:05 AM 08/18/2021   11:00 AM 04/22/2021   10:22 AM 01/17/2021   11:04 AM  Fall Risk   Falls in the past year? 0 0 0 0 0  Number falls in past yr: 0  0 0 0  Injury with Fall?   0 0 0  Risk for fall due to :  No Fall Risks     Follow up Falls evaluation completed Falls evaluation completed Falls evaluation completed Falls evaluation completed Falls evaluation completed    FALL RISK PREVENTION PERTAINING TO THE HOME: Home free of loose throw rugs in walkways, pet beds, electrical cords, etc? Yes  Adequate lighting in your home to reduce risk of falls? Yes   ASSISTIVE DEVICES UTILIZED TO PREVENT FALLS: Use of a cane, walker or w/c? No   TIMED UP AND GO: Was the test performed? No .   Cognitive Function: Patient is alert and oriented x3.     05/20/2017    2:43 PM  MMSE - Mini Mental State Exam  Orientation to time 5  Orientation to Place 5  Registration 3  Attention/ Calculation 5  Recall 3  Language- name 2 objects 2  Language- repeat 1  Language- follow 3 step command 3  Language- read & follow direction 1  Write a sentence 1  Copy design 1  Total score 30        04/16/2020   11:18 AM 08/23/2018    2:08 PM  6CIT Screen  What Year? 0 points 0 points  What month? 0  points 0 points  What time? 0 points 0 points  Count back from 20 0 points 0 points  Months in reverse 0 points 0 points  Repeat phrase 0 points 0 points  Total Score 0 points 0 points    Immunizations Immunization History  Administered Date(s) Administered   Fluad Quad(high Dose 65+) 09/18/2021   Influenza, High Dose Seasonal PF 09/08/2017, 08/19/2018, 07/27/2019   Influenza,inj,Quad PF,6+ Mos 09/12/2014, 07/17/2015   Influenza-Unspecified 08/23/2016, 08/23/2017, 08/23/2020   PFIZER(Purple Top)SARS-COV-2 Vaccination 01/11/2020, 01/31/2020, 08/23/2020   Pneumococcal Conjugate-13 05/20/2017   Pneumococcal Polysaccharide-23 07/10/2013, 07/12/2020   Tdap 05/20/2016   Zoster, Live 07/06/2014    Shingrix Completed?: No.    Education has been provided regarding the importance of this vaccine. Patient has been advised to call insurance company to determine out of pocket expense if they have not yet received this vaccine. Advised may also receive vaccine at local pharmacy or Health Dept. Verbalized acceptance and understanding.  Screening Tests Health Maintenance  Topic Date Due   COVID-19 Vaccine (4 - Booster for Pfizer series) 04/19/2022 (Originally 10/18/2020)   Zoster Vaccines- Shingrix (1 of 2) 07/04/2022 (Originally 06/06/1965)   INFLUENZA VACCINE  06/23/2022   MAMMOGRAM  10/29/2022   TETANUS/TDAP  05/20/2026   COLONOSCOPY (Pts 45-45yrs Insurance coverage will need to be confirmed)  12/10/2026   Pneumonia Vaccine 12+ Years old  Completed   DEXA SCAN  Completed   Hepatitis C Screening  Completed   HPV VACCINES  Aged Out   Health Maintenance There are no preventive care reminders to display for this patient.  Lung Cancer Screening: (Low Dose CT Chest recommended if Age 92-80 years, 30 pack-year currently smoking OR have quit w/in 15years.) does not qualify.   Vision Screening: Recommended annual ophthalmology exams for early detection of glaucoma and other disorders of the  eye.  Dental Screening: Recommended annual dental exams for proper oral hygiene  Community Resource Referral / Chronic Care Management: CRR required this visit?  No   CCM required this visit?  No      Plan:   Keep all routine maintenance appointments.   I have personally reviewed and noted the following in the patient's chart:   Medical and social history Use of alcohol, tobacco or illicit drugs  Current medications and supplements including opioid prescriptions.  Functional ability and status Nutritional status Physical activity Advanced directives List of other physicians Hospitalizations, surgeries, and ER visits in previous 12 months Vitals Screenings to include cognitive, depression, and falls Referrals and appointments  In addition, I have reviewed and discussed with patient certain preventive protocols, quality metrics, and best practice recommendations. A written personalized care plan for preventive services as well as general preventive health recommendations were provided to patient.     OBrien-Blaney, Dywane Peruski L, LPN   4/54/0981     I have reviewed the above information and agree with above.   Duncan Dull, MD

## 2022-04-08 ENCOUNTER — Ambulatory Visit (INDEPENDENT_AMBULATORY_CARE_PROVIDER_SITE_OTHER): Payer: Medicare Other

## 2022-04-08 DIAGNOSIS — E538 Deficiency of other specified B group vitamins: Secondary | ICD-10-CM | POA: Diagnosis not present

## 2022-04-08 MED ORDER — CYANOCOBALAMIN 1000 MCG/ML IJ SOLN
1000.0000 ug | Freq: Once | INTRAMUSCULAR | Status: AC
  Administered 2022-04-08: 1000 ug via INTRAMUSCULAR

## 2022-04-08 NOTE — Progress Notes (Signed)
Patient presented for B 12 injection to left deltoid, patient voiced no concerns nor showed any signs of distress during injection. 

## 2022-04-10 ENCOUNTER — Ambulatory Visit (INDEPENDENT_AMBULATORY_CARE_PROVIDER_SITE_OTHER): Payer: Medicare Other | Admitting: Family Medicine

## 2022-04-10 ENCOUNTER — Encounter: Payer: Self-pay | Admitting: Family Medicine

## 2022-04-10 VITALS — BP 118/80 | HR 74 | Temp 98.7°F | Ht 65.0 in | Wt 227.4 lb

## 2022-04-10 DIAGNOSIS — R300 Dysuria: Secondary | ICD-10-CM | POA: Diagnosis not present

## 2022-04-10 LAB — POCT URINALYSIS DIPSTICK
Blood, UA: POSITIVE
Glucose, UA: NEGATIVE
Ketones, UA: POSITIVE
Nitrite, UA: NEGATIVE
Protein, UA: POSITIVE — AB
Spec Grav, UA: 1.025 (ref 1.010–1.025)
Urobilinogen, UA: 2 E.U./dL — AB
pH, UA: 6 (ref 5.0–8.0)

## 2022-04-10 MED ORDER — CEPHALEXIN 500 MG PO CAPS: 500.0000 mg | ORAL_CAPSULE | Freq: Four times a day (QID) | ORAL | 0 refills | Status: DC

## 2022-04-10 NOTE — Progress Notes (Signed)
  Tommi Rumps, MD Phone: [phone]  Julie Perez is a 76 y.o. female who presents today for same day visit.   Dysuria: Patient notes onset of symptoms 2 days ago.  She has urinary frequency, urgency, and significant urinary odor.  No hematuria, abdominal pain, or vaginal discharge.  Social History   Tobacco Use  Smoking Status Never  Smokeless Tobacco Never  Tobacco Comments   smoking cessation materials not required    Current Outpatient Medications on File Prior to Visit  Medication Sig Dispense Refill   amLODipine (NORVASC) 5 MG tablet TAKE 1 TABLET(5 MG) BY MOUTH AT BEDTIME 90 tablet 3   atorvastatin (LIPITOR) 40 MG tablet TAKE 1 TABLET(40 MG) BY MOUTH AT BEDTIME 90 tablet 3   citalopram (CELEXA) 20 MG tablet TAKE 1 TABLET(20 MG) BY MOUTH DAILY 90 tablet 3   hyoscyamine (ANASPAZ) 0.125 MG TBDP disintergrating tablet Place 1 tablet (0.125 mg total) under the tongue every 6 (six) hours as needed (esophageal spasm). 30 tablet 0   levothyroxine (SYNTHROID) 112 MCG tablet TAKE 1 TABLET BY MOUTH DAILY BEFORE BREAKFAST 90 tablet 3   meloxicam (MOBIC) 15 MG tablet Take 1 tablet (15 mg total) by mouth daily. 30 tablet 0   omeprazole (PRILOSEC) 20 MG capsule TAKE 1 CAPSULE(20 MG) BY MOUTH DAILY 90 capsule 3   No current facility-administered medications on file prior to visit.     ROS see history of present illness  Objective  Physical Exam Vitals:   04/10/22 1613  BP: 118/80  Pulse: 74  Temp: 98.7 F (37.1 C)  SpO2: 96%    BP Readings from Last 3 Encounters:  04/10/22 118/80  12/10/21 133/88  11/07/21 127/79   Wt Readings from Last 3 Encounters:  04/10/22 227 lb 6.4 oz (103.1 kg)  04/03/22 220 lb (99.8 kg)  12/10/21 220 lb (99.8 kg)    Physical Exam Constitutional:      General: She is not in acute distress.    Appearance: She is not diaphoretic.  Pulmonary:     Effort: Pulmonary effort is normal.  Abdominal:     General: Bowel sounds are normal.  There is no distension.     Palpations: Abdomen is soft.     Tenderness: There is no abdominal tenderness.  Skin:    General: Skin is warm and dry.  Neurological:     Mental Status: She is alert.     Assessment/Plan: Please see individual problem list.  Problem List Items Addressed This Visit     Burning with urination - Primary    Concerning for UTI based on urinalysis findings.  We will treat with Keflex 500 mg 4 times daily.  We will send urine for culture and microscopy.  If not improving she will let us know. Discussed reasons to seek medical attention over the weekend including fever or signs of systemic illness. Discussed female anatomy and postmenopausal status play a role in her risk for UTIs.       Relevant Orders   POCT Urinalysis Dipstick (Completed)   Urine Microscopic Only   Urine Culture    Return if symptoms worsen or fail to improve.   Tommi Rumps, MD [place]

## 2022-04-10 NOTE — Assessment & Plan Note (Addendum)
Concerning for UTI based on urinalysis findings.  We will treat with Keflex 500 mg 4 times daily.  We will send urine for culture and microscopy.  If not improving she will let us know. Discussed reasons to seek medical attention over the weekend including fever or signs of systemic illness. Discussed female anatomy and postmenopausal status play a role in her risk for UTIs.

## 2022-04-10 NOTE — Patient Instructions (Signed)
Nice to see you. We will send your urine for a culture and contact you with the results. We will treat you with Keflex for your UTI. If your symptoms are not improving by early next week please let us know.

## 2022-04-11 LAB — URINALYSIS, MICROSCOPIC ONLY
Casts: NONE SEEN /lpf
WBC, UA: >30 /hpf — AB (ref 0–5)

## 2022-04-14 ENCOUNTER — Other Ambulatory Visit: Payer: Self-pay | Admitting: Family Medicine

## 2022-04-14 DIAGNOSIS — N3001 Acute cystitis with hematuria: Secondary | ICD-10-CM

## 2022-04-14 LAB — URINE CULTURE

## 2022-04-14 MED ORDER — NITROFURANTOIN MONOHYD MACRO 100 MG PO CAPS: 100.0000 mg | ORAL_CAPSULE | Freq: Two times a day (BID) | ORAL | 0 refills | Status: DC

## 2022-05-13 ENCOUNTER — Ambulatory Visit: Payer: Medicare Other

## 2022-05-15 ENCOUNTER — Ambulatory Visit (INDEPENDENT_AMBULATORY_CARE_PROVIDER_SITE_OTHER): Payer: Medicare Other

## 2022-05-15 ENCOUNTER — Other Ambulatory Visit: Payer: Medicare Other

## 2022-05-15 DIAGNOSIS — E538 Deficiency of other specified B group vitamins: Secondary | ICD-10-CM

## 2022-05-15 DIAGNOSIS — N3001 Acute cystitis with hematuria: Secondary | ICD-10-CM

## 2022-05-15 LAB — URINALYSIS, ROUTINE W REFLEX MICROSCOPIC
Bilirubin Urine: NEGATIVE
Ketones, ur: NEGATIVE
Leukocytes,Ua: NEGATIVE
Nitrite: NEGATIVE
Specific Gravity, Urine: 1.02 (ref 1.000–1.030)
Total Protein, Urine: NEGATIVE
Urine Glucose: NEGATIVE
Urobilinogen, UA: 1 (ref 0.0–1.0)
pH: 6 (ref 5.0–8.0)

## 2022-05-15 MED ORDER — CYANOCOBALAMIN 1000 MCG/ML IJ SOLN
1000.0000 ug | Freq: Once | INTRAMUSCULAR | Status: AC
  Administered 2022-05-15: 1000 ug via INTRAMUSCULAR

## 2022-05-16 ENCOUNTER — Other Ambulatory Visit: Payer: Self-pay | Admitting: Internal Medicine

## 2022-05-19 MED ORDER — MELOXICAM 15 MG PO TABS: 15.0000 mg | ORAL_TABLET | Freq: Every day | ORAL | 0 refills | Status: DC

## 2022-05-19 MED ORDER — HYOSCYAMINE SULFATE 0.125 MG PO TBDP: 0.1250 mg | ORAL_TABLET | Freq: Four times a day (QID) | ORAL | 0 refills | Status: DC | PRN

## 2022-06-15 ENCOUNTER — Ambulatory Visit (INDEPENDENT_AMBULATORY_CARE_PROVIDER_SITE_OTHER): Payer: Medicare Other

## 2022-06-15 DIAGNOSIS — E538 Deficiency of other specified B group vitamins: Secondary | ICD-10-CM

## 2022-06-15 MED ORDER — CYANOCOBALAMIN 1000 MCG/ML IJ SOLN
1000.0000 ug | Freq: Once | INTRAMUSCULAR | Status: AC
  Administered 2022-06-15: 1000 ug via INTRAMUSCULAR

## 2022-06-15 NOTE — Progress Notes (Signed)
Pt arrived for B12 injection, given in L deltoid. Pt tolerated injection well, showed no signs of distress nor voiced any concerns.  ?

## 2022-07-03 ENCOUNTER — Other Ambulatory Visit: Payer: Self-pay | Admitting: Internal Medicine

## 2022-07-03 DIAGNOSIS — F411 Generalized anxiety disorder: Secondary | ICD-10-CM

## 2022-07-11 IMAGING — CR DG CHEST 2V
2 series · 2 of 2 positions shown · non-contrast
Comparison: 03/18/2015

CLINICAL DATA: Cough for 3 weeks, negative for [id number]

EXAM:
CHEST - 2 VIEW

[chest pa]
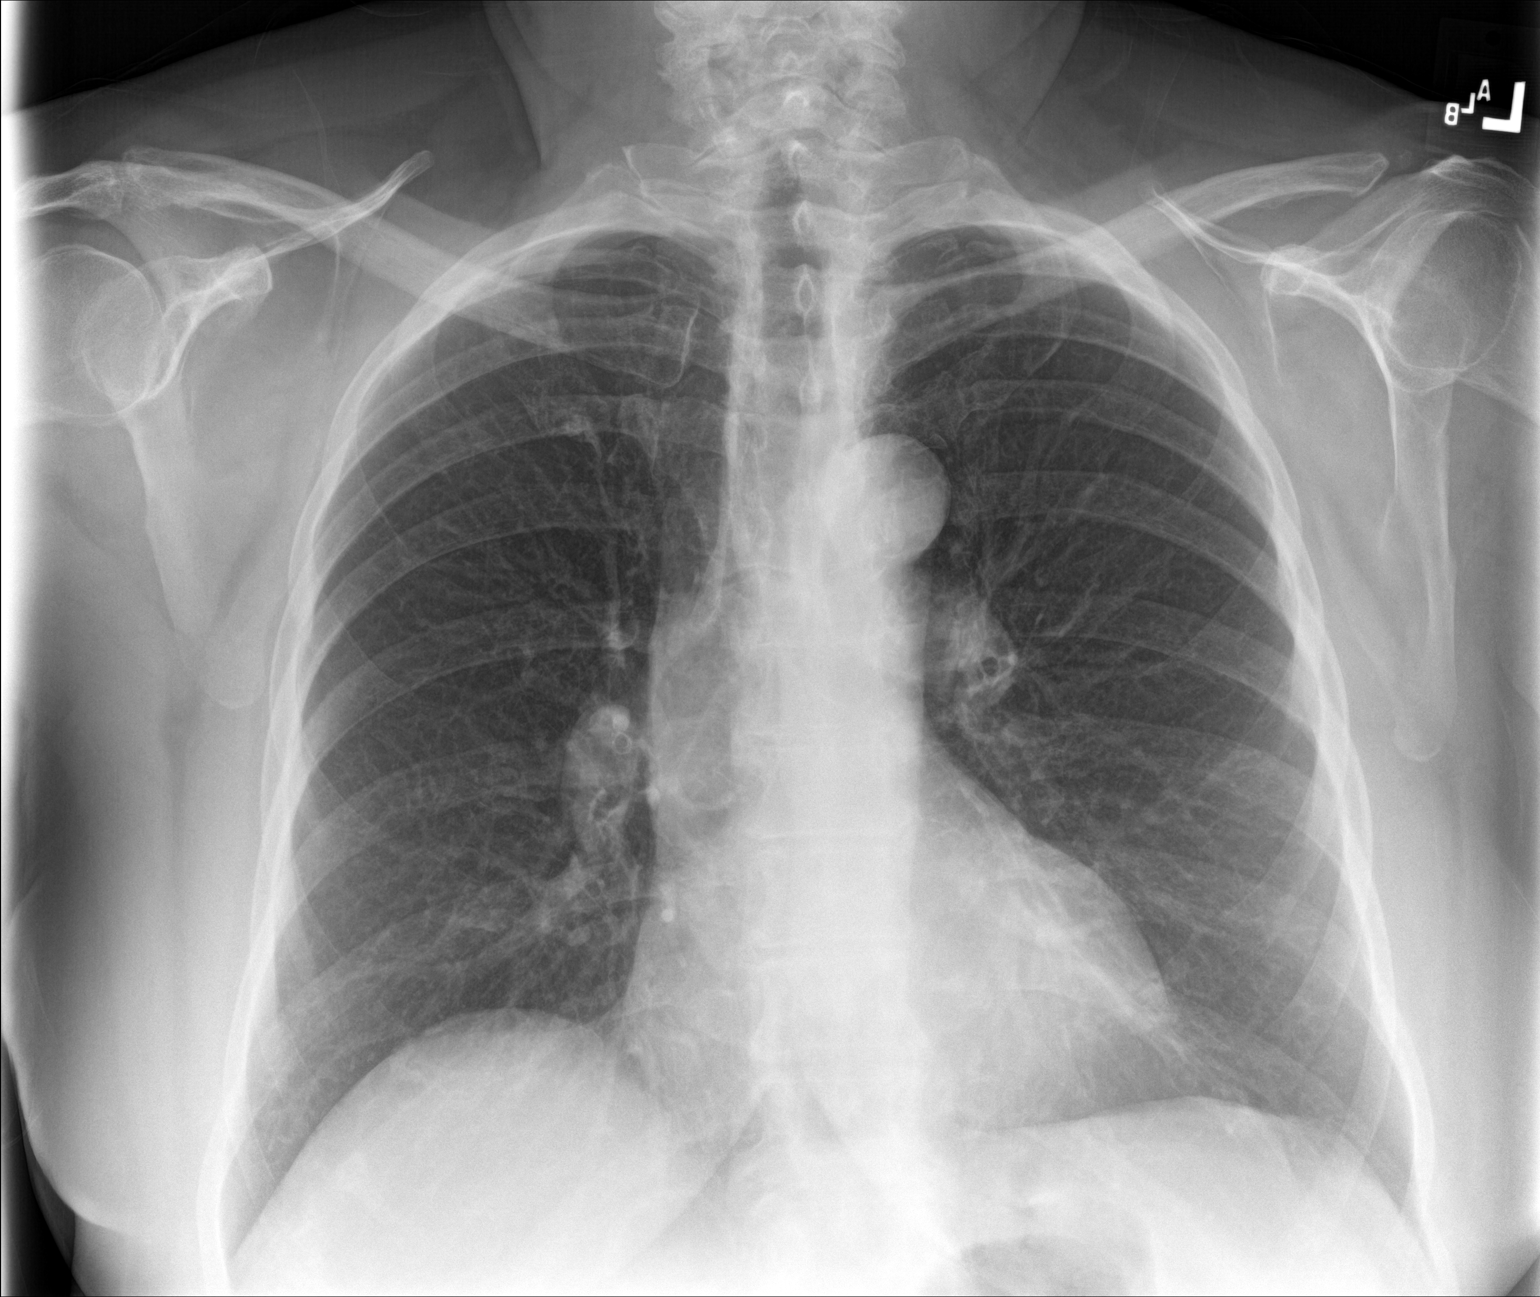

[chest lat]
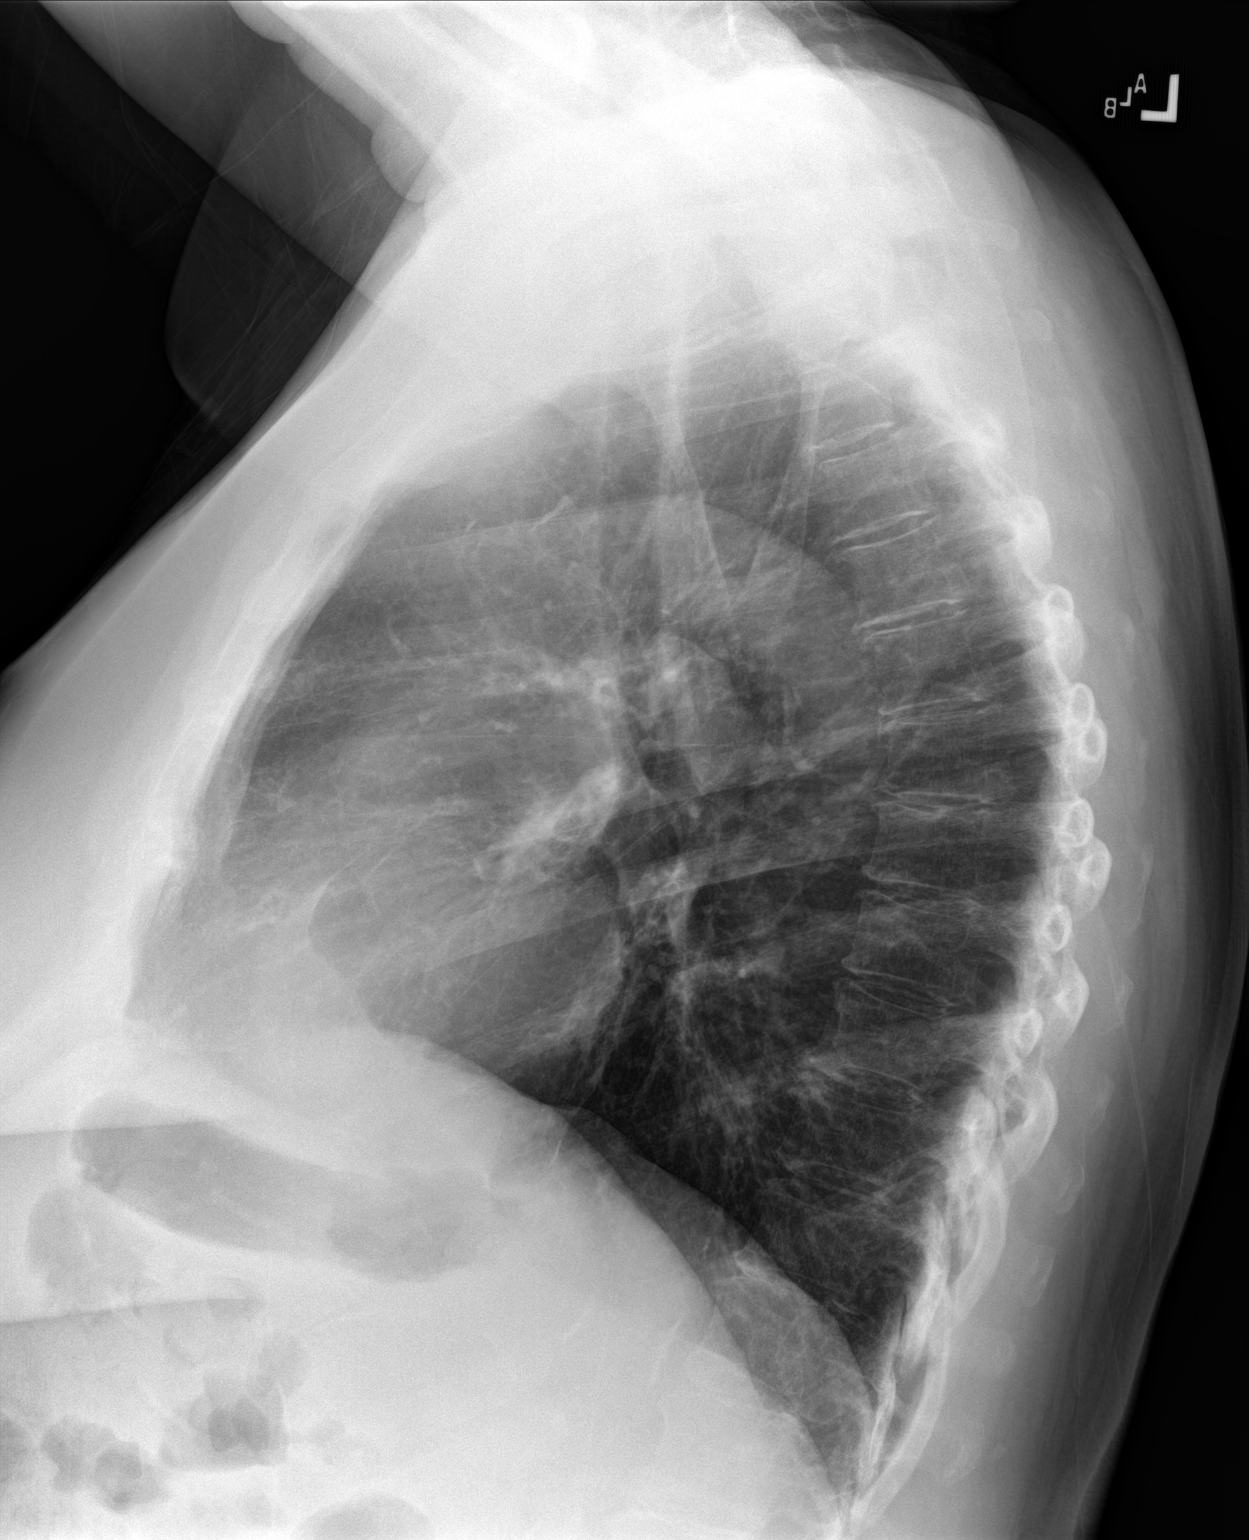

[2 of 2 positions shown; findings below may reference images not displayed]

FINDINGS: The heart size and mediastinal contours are within normal limits.
Both lungs are clear. The visualized skeletal structures are
unremarkable.
IMPRESSION: No active cardiopulmonary disease.

## 2022-07-12 ENCOUNTER — Ambulatory Visit
Admission: EM | Admit: 2022-07-12 | Discharge: 2022-07-12 | Disposition: A | Discharge status: Home / Self Care | Payer: Medicare Other | Attending: Physician Assistant | Admitting: Physician Assistant

## 2022-07-12 DIAGNOSIS — N3001 Acute cystitis with hematuria: Secondary | ICD-10-CM

## 2022-07-12 DIAGNOSIS — R300 Dysuria: Secondary | ICD-10-CM

## 2022-07-12 LAB — URINALYSIS, ROUTINE W REFLEX MICROSCOPIC
Bilirubin Urine: NEGATIVE
Glucose, UA: NEGATIVE mg/dL
Ketones, ur: NEGATIVE mg/dL
Nitrite: NEGATIVE
Protein, ur: 100 mg/dL — AB
Specific Gravity, Urine: 1.02 (ref 1.005–1.030)
pH: 7.5 (ref 5.0–8.0)

## 2022-07-12 LAB — URINALYSIS, MICROSCOPIC (REFLEX): WBC, UA: >50 WBC/hpf (ref 0–5)

## 2022-07-12 MED ORDER — AMOXICILLIN-POT CLAVULANATE 875-125 MG PO TABS: 1.0000 | ORAL_TABLET | Freq: Two times a day (BID) | ORAL | 0 refills | Status: AC

## 2022-07-12 MED ORDER — PHENAZOPYRIDINE HCL 200 MG PO TABS: 200.0000 mg | ORAL_TABLET | Freq: Three times a day (TID) | ORAL | 0 refills | Status: DC

## 2022-07-12 NOTE — ED Provider Notes (Signed)
[facility]    CSN: [id number] Arrival date & time: 07/12/22  1317      History   Chief Complaint Chief Complaint  Patient presents with   Urinary Tract Infection    HPI Julie Perez is a 76 y.o. female presenting for onset of dysuria, frequency and urgency yesterday.  Patient reports not feeling well all week though.  She denies fever, fatigue, flank pain, lower abdominal pain, hematuria or vaginal discharge.  Reports she believes she has a UTI.  History of UTI back in May.  Reports she took all the medication and symptoms resolved.  HPI  Past Medical History:  Diagnosis Date   Anxiety    Arthritis    lower back   Frequent headaches    stress  related   Grade I diastolic dysfunction 12/17/5807   Echo June 2019   High cholesterol    Hyperlipidemia    Hypertension    Hyperthyroidism    Migraines    Mild concentric left ventricular hypertrophy 05/17/2018   Echo June 2019   S/P hysterectomy 09/12/2014   Due to fibroids,    Thyroid disease     Patient Active Problem List   Diagnosis Date Noted   Chronic GERD    Chronic diarrhea    Aphthous ulcer of ileum    Esophageal dysphagia 09/21/2021   Urinary hesitancy 06/29/2021   Strain of gastrocnemius tendon 06/13/2021   Pain of right lower extremity 04/22/2021   Popliteal cyst, right 04/22/2021   Incontinence of feces with fecal urgency 07/14/2020   Mild concentric left ventricular hypertrophy 05/17/2018   Grade I diastolic dysfunction 98/33/8250   DDD (degenerative disc disease), lumbar 02/07/2018   Right lumbar radiculopathy 07/05/2017   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   Obesity (BMI 30.0-34.9) 01/05/2017   Burning with urination 06/18/2016   Fatigue 05/26/2015   Cardiomegaly 04/20/2015   Essential hypertension 04/20/2015   Chronic cough 11/29/2014   Long-term use of high-risk medication 11/19/2014   Cephalalgia 10/02/2014   Postmenopausal atrophic vaginitis 09/15/2014    Generalized anxiety disorder 09/15/2014   B12 deficiency 06/22/2014   Vitamin D deficiency 06/22/2014   OSA (obstructive sleep apnea) 06/22/2014   Hypothyroidism, postradioiodine therapy 06/20/2014   Hyperlipidemia LDL goal <130 04/09/2008    Past Surgical History:  Procedure Laterality Date   ABDOMINAL HYSTERECTOMY  1979   COLONOSCOPY WITH PROPOFOL N/A 12/10/2021   Procedure: COLONOSCOPY WITH PROPOFOL;  Surgeon: Lin Landsman, MD;  Location: [facility] ENDOSCOPY;  Service: Gastroenterology;  Laterality: N/A;   ESOPHAGOGASTRODUODENOSCOPY N/A 12/10/2021   Procedure: ESOPHAGOGASTRODUODENOSCOPY (EGD);  Surgeon: Lin Landsman, MD;  Location: [facility] ENDOSCOPY;  Service: Gastroenterology;  Laterality: N/A;   SHOULDER ARTHROSCOPY WITH ROTATOR CUFF REPAIR Left 09/14/2019   Procedure: SHOULDER ARTHROSCOPY WITH ROTATOR CUFF REPAIR;  Surgeon: Lovell Sheehan, MD;  Location: [facility];  Service: Orthopedics;  Laterality: Left;   VAGINAL HYSTERECTOMY  1979    OB History   No obstetric history on file.      Home Medications    Prior to Admission medications   Medication Sig Start Date End Date Taking? Authorizing Provider  amLODipine (NORVASC) 5 MG tablet TAKE 1 TABLET(5 MG) BY MOUTH AT BEDTIME 01/04/22  Yes Dutch Quint B, FNP  amoxicillin-clavulanate (AUGMENTIN) 875-125 MG tablet Take 1 tablet by mouth every 12 (twelve) hours for 7 days. 07/12/22 07/19/22 Yes Laurene Footman B, PA-C  atorvastatin (LIPITOR) 40 MG tablet TAKE 1 TABLET(40 MG) BY MOUTH AT  BEDTIME 01/05/22  Yes Crecencio Mc, MD  citalopram (CELEXA) 20 MG tablet TAKE 1 TABLET(20 MG) BY MOUTH DAILY 07/03/22  Yes Crecencio Mc, MD  hyoscyamine (ANASPAZ) 0.125 MG TBDP disintergrating tablet Place 1 tablet (0.125 mg total) under the tongue every 6 (six) hours as needed (esophageal spasm). 05/19/22  Yes Crecencio Mc, MD  levothyroxine (SYNTHROID) 112 MCG tablet TAKE 1 TABLET BY MOUTH DAILY BEFORE BREAKFAST 01/04/22  Yes Dutch Quint B, FNP  meloxicam (MOBIC) 15 MG tablet Take 1 tablet (15 mg total) by mouth daily. 05/19/22  Yes Crecencio Mc, MD  omeprazole (PRILOSEC) 20 MG capsule TAKE 1 CAPSULE(20 MG) BY MOUTH DAILY 12/05/21  Yes Crecencio Mc, MD  phenazopyridine (PYRIDIUM) 200 MG tablet Take 1 tablet (200 mg total) by mouth 3 (three) times daily. 07/12/22  Yes Danton Clap, PA-C    Family History Family History  Problem Relation Age of Onset   Hypertension Mother    Alzheimer's disease Mother    Cancer Father 18       prostate cancer   Cancer Sister 65       breast cancer   Breast cancer Sister 80   Aneurysm Sister    Diabetes Brother        type 2   Heart disease Sister        cabg x 4, tobacco abuse   Diabetes Sister        type 2   Lung cancer Nephew     Social History Social History   Tobacco Use   Smoking status: Never   Smokeless tobacco: Never   Tobacco comments:    smoking cessation materials not required  Vaping Use   Vaping Use: Never used  Substance Use Topics   Alcohol use: Yes    Alcohol/week: 1.0 standard drink of alcohol    Types: 1 Glasses of wine per week    Comment: rare   Drug use: No     Allergies   Patient has no known allergies.   Review of Systems Review of Systems  Constitutional:  Negative for chills, fatigue and fever.  Gastrointestinal:  Negative for abdominal pain, diarrhea, nausea and vomiting.  Genitourinary:  Positive for dysuria, frequency and urgency. Negative for decreased urine volume, flank pain, hematuria, pelvic pain, vaginal bleeding, vaginal discharge and vaginal pain.  Musculoskeletal:  Negative for back pain.  Skin:  Negative for rash.     Physical Exam Triage Vital Signs ED Triage Vitals [07/12/22 1325]  Enc Vitals Group     BP      Pulse      Resp      Temp      Temp src      SpO2      Weight 213 lb (96.6 kg)     Height '5\' 5"'$  (1.651 m)     Head Circumference      Peak Flow      Pain Score 4     Pain Loc       Pain Edu?      Excl. in [place]?    No data found.  Updated Vital Signs BP (!) 150/87 (BP Location: Left Arm)   Pulse 73   Temp 99.1 F (37.3 C) (Oral)   Resp 18   Ht '5\' 5"'$  (1.651 m)   Wt 213 lb (96.6 kg)   SpO2 97%   BMI 35.45 kg/m      Physical Exam Vitals and nursing  note reviewed.  Constitutional:      General: She is not in acute distress.    Appearance: Normal appearance. She is not ill-appearing or toxic-appearing.  HENT:     Head: Normocephalic and atraumatic.  Eyes:     General: No scleral icterus.       Right eye: No discharge.        Left eye: No discharge.     Conjunctiva/sclera: Conjunctivae normal.  Cardiovascular:     Rate and Rhythm: Normal rate and regular rhythm.     Heart sounds: Normal heart sounds.  Pulmonary:     Effort: Pulmonary effort is normal. No respiratory distress.     Breath sounds: Normal breath sounds.  Abdominal:     Palpations: Abdomen is soft.     Tenderness: There is no abdominal tenderness. There is no right CVA tenderness or left CVA tenderness.  Musculoskeletal:     Cervical back: Neck supple.  Skin:    General: Skin is dry.  Neurological:     General: No focal deficit present.     Mental Status: She is alert. Mental status is at baseline.     Motor: No weakness.     Gait: Gait normal.  Psychiatric:        Mood and Affect: Mood normal.        Behavior: Behavior normal.        Thought Content: Thought content normal.      UC Treatments / Results  Labs (all labs ordered are listed, but only abnormal results are displayed) Labs Reviewed  URINALYSIS, ROUTINE W REFLEX MICROSCOPIC - Abnormal; Notable for the following components:      Result Value   APPearance CLOUDY (*)    Hgb urine dipstick MODERATE (*)    Protein, ur 100 (*)    Leukocytes,Ua LARGE (*)    All other components within normal limits  URINALYSIS, MICROSCOPIC (REFLEX) - Abnormal; Notable for the following components:   Bacteria, UA FEW (*)    All other  components within normal limits  URINE CULTURE    EKG   Radiology No results found.  Procedures Procedures (including critical care time)  Medications Ordered in UC Medications - No data to display  Initial Impression / Assessment and Plan / UC Course  I have reviewed the triage vital signs and the nursing notes.  Pertinent labs & imaging results that were available during my care of the patient were reviewed by me and considered in my medical decision making (see chart for details).   76 year old female presents for dysuria, frequency and urgency since yesterday.  She is afebrile and overall well-appearing.  Abdomen is soft and nontender.  No CVA tenderness.  UA shows cloudy urine with moderate hemoglobin and large leukocytes.  We will urine for culture and treat for UTI.  Sent Augmentin to pharmacy as well as Pyridium.  Encouraged her to increase her rest and fluids.  Reviewed return precautions.   Final Clinical Impressions(s) / UC Diagnoses   Final diagnoses:  Acute cystitis with hematuria  Dysuria     Discharge Instructions      UTI: Based on either symptoms or urinalysis, you may have a urinary tract infection. We will send the urine for culture and call with results in a few days. Begin antibiotics at this time. Your symptoms should be much improved over the next 2-3 days. Increase rest and fluid intake. If for some reason symptoms are worsening or not improving after a couple  of days or the urine culture determines the antibiotics you are taking will not treat the infection, the antibiotics may be changed. Return or go to ER for fever, back pain, worsening urinary pain, discharge, increased blood in urine. May take Tylenol or Motrin OTC for pain relief or consider AZO if no contraindications      ED Prescriptions     Medication Sig Dispense Auth. Provider   amoxicillin-clavulanate (AUGMENTIN) 875-125 MG tablet Take 1 tablet by mouth every 12 (twelve) hours for 7  days. 14 tablet Laurene Footman B, PA-C   phenazopyridine (PYRIDIUM) 200 MG tablet Take 1 tablet (200 mg total) by mouth 3 (three) times daily. 6 tablet Gretta Cool      PDMP not reviewed this encounter.   Danton Clap, PA-C 07/12/22 1354

## 2022-07-12 NOTE — ED Triage Notes (Signed)
Pt c/o possible UTI x1week.  Pt states that she has had burning and frequency x1day and has felt bad all week.

## 2022-07-12 NOTE — Discharge Instructions (Signed)

## 2022-07-14 LAB — URINE CULTURE: Culture: >=100000 — AB

## 2022-07-17 ENCOUNTER — Ambulatory Visit (INDEPENDENT_AMBULATORY_CARE_PROVIDER_SITE_OTHER): Payer: Medicare Other

## 2022-07-17 DIAGNOSIS — E538 Deficiency of other specified B group vitamins: Secondary | ICD-10-CM | POA: Diagnosis not present

## 2022-07-17 MED ORDER — CYANOCOBALAMIN 1000 MCG/ML IJ SOLN
1000.0000 ug | Freq: Once | INTRAMUSCULAR | Status: AC
  Administered 2022-07-17: 1000 ug via INTRAMUSCULAR

## 2022-07-17 NOTE — Progress Notes (Signed)
Pt arrived for B12 injection, given in R deltoid. Pt tolerated injection well, showed no signs of distress nor voiced any concerns.  

## 2022-07-27 ENCOUNTER — Other Ambulatory Visit: Payer: Self-pay | Admitting: Internal Medicine

## 2022-07-28 MED ORDER — HYOSCYAMINE SULFATE 0.125 MG PO TBDP: 0.1250 mg | ORAL_TABLET | Freq: Four times a day (QID) | ORAL | 0 refills | Status: DC | PRN

## 2022-08-18 DIAGNOSIS — Z03818 Encounter for observation for suspected exposure to other biological agents ruled out: Secondary | ICD-10-CM | POA: Diagnosis not present

## 2022-08-18 DIAGNOSIS — Z6836 Body mass index (BMI) 36.0-36.9, adult: Secondary | ICD-10-CM | POA: Diagnosis not present

## 2022-08-18 DIAGNOSIS — Z20822 Contact with and (suspected) exposure to covid-19: Secondary | ICD-10-CM | POA: Diagnosis not present

## 2022-08-19 ENCOUNTER — Ambulatory Visit (INDEPENDENT_AMBULATORY_CARE_PROVIDER_SITE_OTHER): Payer: Medicare Other | Admitting: Internal Medicine

## 2022-08-19 ENCOUNTER — Encounter: Payer: Self-pay | Admitting: Internal Medicine

## 2022-08-19 VITALS — BP 110/64 | HR 62 | Temp 98.2°F | Ht 65.0 in | Wt 231.4 lb

## 2022-08-19 DIAGNOSIS — E538 Deficiency of other specified B group vitamins: Secondary | ICD-10-CM | POA: Diagnosis not present

## 2022-08-19 DIAGNOSIS — R300 Dysuria: Secondary | ICD-10-CM

## 2022-08-19 DIAGNOSIS — B9629 Other Escherichia coli [E. coli] as the cause of diseases classified elsewhere: Secondary | ICD-10-CM

## 2022-08-19 DIAGNOSIS — Z1612 Extended spectrum beta lactamase (ESBL) resistance: Secondary | ICD-10-CM | POA: Diagnosis not present

## 2022-08-19 DIAGNOSIS — E559 Vitamin D deficiency, unspecified: Secondary | ICD-10-CM | POA: Diagnosis not present

## 2022-08-19 DIAGNOSIS — K580 Irritable bowel syndrome with diarrhea: Secondary | ICD-10-CM

## 2022-08-19 DIAGNOSIS — N390 Urinary tract infection, site not specified: Secondary | ICD-10-CM | POA: Diagnosis not present

## 2022-08-19 DIAGNOSIS — E890 Postprocedural hypothyroidism: Secondary | ICD-10-CM

## 2022-08-19 DIAGNOSIS — E785 Hyperlipidemia, unspecified: Secondary | ICD-10-CM | POA: Diagnosis not present

## 2022-08-19 DIAGNOSIS — K529 Noninfective gastroenteritis and colitis, unspecified: Secondary | ICD-10-CM

## 2022-08-19 DIAGNOSIS — N898 Other specified noninflammatory disorders of vagina: Secondary | ICD-10-CM | POA: Insufficient documentation

## 2022-08-19 DIAGNOSIS — K589 Irritable bowel syndrome without diarrhea: Secondary | ICD-10-CM | POA: Insufficient documentation

## 2022-08-19 DIAGNOSIS — Z23 Encounter for immunization: Secondary | ICD-10-CM

## 2022-08-19 LAB — B12 AND FOLATE PANEL
Folate: 10.6 ng/mL (ref >5.9)
Vitamin B-12: 179 pg/mL — ABNORMAL LOW (ref 211–911)

## 2022-08-19 LAB — URINALYSIS, ROUTINE W REFLEX MICROSCOPIC
Bilirubin Urine: NEGATIVE
Ketones, ur: NEGATIVE
Nitrite: NEGATIVE
Specific Gravity, Urine: 1.015 (ref 1.000–1.030)
Total Protein, Urine: NEGATIVE
Urine Glucose: NEGATIVE
Urobilinogen, UA: 4 — AB (ref 0.0–1.0)
pH: 6.5 (ref 5.0–8.0)

## 2022-08-19 LAB — COMPREHENSIVE METABOLIC PANEL
ALT: 19 U/L (ref 0–35)
AST: 17 U/L (ref 0–37)
Albumin: 4.2 g/dL (ref 3.5–5.2)
Alkaline Phosphatase: 65 U/L (ref 39–117)
BUN: 14 mg/dL (ref 6–23)
CO2: 27 mEq/L (ref 19–32)
Calcium: 8.7 mg/dL (ref 8.4–10.5)
Chloride: 103 mEq/L (ref 96–112)
Creatinine, Ser: 0.74 mg/dL (ref 0.40–1.20)
GFR: 79.27 mL/min (ref >60.00)
Glucose, Bld: 107 mg/dL — ABNORMAL HIGH (ref 70–99)
Potassium: 4.2 mEq/L (ref 3.5–5.1)
Sodium: 139 mEq/L (ref 135–145)
Total Bilirubin: 1 mg/dL (ref 0.2–1.2)
Total Protein: 6.9 g/dL (ref 6.0–8.3)

## 2022-08-19 LAB — CBC WITH DIFFERENTIAL/PLATELET
Basophils Absolute: 0 10*3/uL (ref 0.0–0.1)
Basophils Relative: 0.6 % (ref 0.0–3.0)
Eosinophils Absolute: 0.1 10*3/uL (ref 0.0–0.7)
Eosinophils Relative: 1.9 % (ref 0.0–5.0)
HCT: 40 % (ref 36.0–46.0)
Hemoglobin: 13.3 g/dL (ref 12.0–15.0)
Lymphocytes Relative: 23.6 % (ref 12.0–46.0)
Lymphs Abs: 1.6 10*3/uL (ref 0.7–4.0)
MCHC: 33.2 g/dL (ref 30.0–36.0)
MCV: 89.5 fl (ref 78.0–100.0)
Monocytes Absolute: 0.6 10*3/uL (ref 0.1–1.0)
Monocytes Relative: 8.7 % (ref 3.0–12.0)
Neutro Abs: 4.3 10*3/uL (ref 1.4–7.7)
Neutrophils Relative %: 65.2 % (ref 43.0–77.0)
Platelets: 208 10*3/uL (ref 150.0–400.0)
RBC: 4.47 Mil/uL (ref 3.87–5.11)
RDW: 14.5 % (ref 11.5–15.5)
WBC: 6.6 10*3/uL (ref 4.0–10.5)

## 2022-08-19 LAB — LIPID PANEL
Cholesterol: 179 mg/dL (ref 0–200)
HDL: 51 mg/dL (ref >39.00)
LDL Cholesterol: 108 mg/dL — ABNORMAL HIGH (ref 0–99)
NonHDL: 127.67
Total CHOL/HDL Ratio: 4
Triglycerides: 96 mg/dL (ref 0.0–149.0)
VLDL: 19.2 mg/dL (ref 0.0–40.0)

## 2022-08-19 LAB — POCT URINALYSIS DIPSTICK
Bilirubin, UA: NEGATIVE
Glucose, UA: NEGATIVE
Ketones, UA: NEGATIVE
Nitrite, UA: NEGATIVE
Protein, UA: NEGATIVE
Spec Grav, UA: 1.015 (ref 1.010–1.025)
Urobilinogen, UA: >=8 E.U./dL — AB
pH, UA: 6.5 (ref 5.0–8.0)

## 2022-08-19 LAB — VITAMIN D 25 HYDROXY (VIT D DEFICIENCY, FRACTURES): VITD: 21.17 ng/mL — ABNORMAL LOW (ref 30.00–100.00)

## 2022-08-19 LAB — TSH: TSH: 5.74 u[IU]/mL — ABNORMAL HIGH (ref 0.35–5.50)

## 2022-08-19 MED ORDER — CLOBETASOL PROPIONATE 0.05 % EX CREA: 1.0000 | TOPICAL_CREAM | Freq: Two times a day (BID) | CUTANEOUS | 0 refills | Status: DC

## 2022-08-19 NOTE — Assessment & Plan Note (Signed)
Suspected given persistence of loose stools ( 2 daily,  No solid stools) . EGD and colonoscopy were normal in Jan 2023.  Recommend dietary changes eg Autoimmune Paleo diet

## 2022-08-19 NOTE — Assessment & Plan Note (Signed)
Unclear if this is due to UTI, irritation from fecal incontinence,  or early lichen sclerosis.  rx one week of topical steroids,  Use barrier cream/A &  Ointment.

## 2022-08-19 NOTE — Progress Notes (Signed)
Subjective:  Patient ID: Julie Perez, female    DOB: 10/11/1946  Age: 76 y.o. MRN: [id number]  CC: The primary encounter diagnosis was Dysuria. Diagnoses of Vaginal itching, B12 deficiency, Hypothyroidism, postradioiodine therapy, Hyperlipidemia LDL goal <130, Burning with urination, Vitamin D deficiency, UTI due to extended-spectrum beta lactamase (ESBL) producing Escherichia coli, Vaginal irritation, Irritable bowel syndrome with diarrhea, Need for immunization against influenza, and Chronic diarrhea were also pertinent to this visit.   HPI Julie Perez presents for evaluation of vaginal irritation  Chief Complaint  Patient presents with   Urinary Tract Infection    Vaginal irritation & burning. Also irritation in rectal area as well. Has been using monistat & was treated for UTI by UC earlier in the month.    1) Julie Perez was treated for  E Coli UTI on August 2 with augmentin x 7 days, after presenting with dysuria and a burning feeling in her chest , which she states is common for her when she has a UTI . The symptoms of chest burning have resolved,  but still having dysuria and vaginal irritation.  Has been using monistat topically (not intravaginally) for the last several weeks without change.   Has chronic diarrhea 2 loose stools daily ,  no change to this pattern,  has fecal incontinence on average once every two weeks when the diarrhea "strikes" while she is driving.  She is not  taking a probiotic.  2) Chronic diarrhea::  she has no solid stools.  Averages 2 loose stools daily ,  was seen by GI  Dr Marius Ditch,  EGD and colonoscopy in Jan 2023 were unrevealing.  Ulcers noted in distal colon but path was negative for Crohn's.  Stool studies for pancreatic insufficiency done. No therapy given.  Has not tried an elimination diet.  Using imodium occasionally.   Outpatient Medications Prior to Visit  Medication Sig Dispense Refill   amLODipine (NORVASC) 5 MG tablet TAKE 1 TABLET(5 MG) BY  MOUTH AT BEDTIME 90 tablet 3   atorvastatin (LIPITOR) 40 MG tablet TAKE 1 TABLET(40 MG) BY MOUTH AT BEDTIME 90 tablet 3   citalopram (CELEXA) 20 MG tablet TAKE 1 TABLET(20 MG) BY MOUTH DAILY 90 tablet 3   hyoscyamine (ANASPAZ) 0.125 MG TBDP disintergrating tablet Place 1 tablet (0.125 mg total) under the tongue every 6 (six) hours as needed (esophageal spasm). 30 tablet 0   levothyroxine (SYNTHROID) 112 MCG tablet TAKE 1 TABLET BY MOUTH DAILY BEFORE BREAKFAST 90 tablet 3   meloxicam (MOBIC) 15 MG tablet Take 1 tablet (15 mg total) by mouth daily. 30 tablet 0   omeprazole (PRILOSEC) 20 MG capsule TAKE 1 CAPSULE(20 MG) BY MOUTH DAILY 90 capsule 3   phenazopyridine (PYRIDIUM) 200 MG tablet Take 1 tablet (200 mg total) by mouth 3 (three) times daily. 6 tablet 0   No facility-administered medications prior to visit.    Review of Systems;  Patient denies headache, fevers, malaise, unintentional weight loss, skin rash, eye pain, sinus congestion and sinus pain, sore throat, dysphagia,  hemoptysis , cough, dyspnea, wheezing, chest pain, palpitations, orthopnea, edema, abdominal pain, nausea, melena, diarrhea, constipation, flank pain, dysuria, hematuria, urinary  Frequency, nocturia, numbness, tingling, seizures,  Focal weakness, Loss of consciousness,  Tremor, insomnia, depression, anxiety, and suicidal ideation.      Objective:  BP 110/64 (BP Location: Left Arm, Patient Position: Sitting, Cuff Size: Large)   Pulse 62   Temp 98.2 F (36.8 C) (Oral)   Ht '5\' 5"'$  (1.651  m)   Wt 231 lb 6.4 oz (105 kg)   SpO2 96%   BMI 38.51 kg/m   BP Readings from Last 3 Encounters:  08/19/22 110/64  07/12/22 (!) 150/87  04/10/22 118/80    Wt Readings from Last 3 Encounters:  08/19/22 231 lb 6.4 oz (105 kg)  07/12/22 213 lb (96.6 kg)  04/10/22 227 lb 6.4 oz (103.1 kg)    General appearance: alert, cooperative and appears stated age Ears: normal TM's and external ear canals both ears Throat: lips,  mucosa, and tongue normal; teeth and gums normal Neck: no adenopathy, no carotid bruit, supple, symmetrical, trachea midline and thyroid not enlarged, symmetric, no tenderness/mass/nodules Back: symmetric, no curvature. ROM normal. No CVA tenderness. Lungs: clear to auscultation bilaterally Heart: regular rate and rhythm, S1, S2 normal, no murmur, click, rub or gallop Abdomen: soft, non-tender; bowel sounds normal; no masses,  no organomegaly Pulses: 2+ and symmetric Skin: Skin color, texture, turgor normal. No rashes or lesions Lymph nodes: Cervical, supraclavicular, and axillary nodes normal. GYN:  vaginal irritation with normal internal exam.  Neuro:  awake and interactive with normal mood and affect. Higher cortical functions are normal. Speech is clear without word-finding difficulty or dysarthria. Extraocular movements are intact. Visual fields of both eyes are grossly intact. Sensation to light touch is grossly intact bilaterally of upper and lower extremities. Motor examination shows 4+/5 symmetric hand grip and upper extremity and 5/5 lower extremity strength. There is no pronation or drift. Gait is non-ataxic   Lab Results  Component Value Date   HGBA1C 5.4 09/01/2019   HGBA1C 5.5 01/04/2017    Lab Results  Component Value Date   CREATININE 0.73 09/18/2021   CREATININE 0.75 06/26/2021   CREATININE 0.67 01/17/2021    Lab Results  Component Value Date   WBC 5.6 09/18/2021   HGB 13.6 09/18/2021   HCT 41.1 09/18/2021   PLT 208.0 09/18/2021   GLUCOSE 99 09/18/2021   CHOL 183 01/17/2021   TRIG 188.0 (H) 01/17/2021   HDL 44.70 01/17/2021   LDLDIRECT 113.0 05/20/2016   LDLCALC 101 (H) 01/17/2021   ALT 18 09/18/2021   AST 18 09/18/2021   NA 140 09/18/2021   K 3.9 09/18/2021   CL 102 09/18/2021   CREATININE 0.73 09/18/2021   BUN 10 09/18/2021   CO2 30 09/18/2021   TSH 2.25 09/18/2021   HGBA1C 5.4 09/01/2019    No results found.  Assessment & Plan:   Problem List  Items Addressed This Visit     Hyperlipidemia LDL goal <130   Relevant Orders   Comprehensive metabolic panel   Lipid panel   Hypothyroidism, postradioiodine therapy   Relevant Orders   TSH   B12 deficiency   Relevant Orders   B12 and Folate Panel   Vitamin D deficiency   Relevant Orders   VITAMIN D 25 Hydroxy (Vit-D Deficiency, Fractures)   Burning with urination   Relevant Orders   CBC with Differential/Platelet   Chronic diarrhea    Etiology unclear. Colonoscopy and path report reviewed.  Needs to complete stool study for pancreatic insufficiency. If normal,  Recommend elimination diet      Relevant Orders   Pancreatic Elastase, Fecal   UTI due to extended-spectrum beta lactamase (ESBL) producing Escherichia coli    She was treated in early august with Augmentin for 7 days,  With persistent symptoms.  Repeating urine culture today      Vaginal irritation    Unclear if this is due  to UTI, irritation from fecal incontinence,  or early lichen sclerosis.  rx one week of topical steroids,  Use barrier cream/A &  Ointment.      IBS (irritable bowel syndrome)    Suspected given persistence of loose stools ( 2 daily,  No solid stools) . EGD and colonoscopy were normal in Jan 2023.  Recommend dietary changes eg Autoimmune Paleo diet       Other Visit Diagnoses     Dysuria    -  Primary   Relevant Orders   POCT Urinalysis Dipstick (Completed)   Urine Culture   Urinalysis, Routine w reflex microscopic   Vaginal itching       Relevant Orders   Cervicovaginal ancillary only( [place])   Need for immunization against influenza       Relevant Orders   Flu Vaccine QUAD High Dose(Fluad) (Completed)       I spent a total of  40 minutes with this patient in a face to face visit on the date of this encounter reviewing the last office visit with  Dr Marius Ditch  ,  urgent Care visit in August, last year of labs and imaging studies ,   and post visit ordering of testing and  therapeutics.    Follow-up: No follow-ups on file.   Crecencio Mc, MD

## 2022-08-19 NOTE — Assessment & Plan Note (Signed)
She was treated in early august with Augmentin for 7 days,  With persistent symptoms.  Repeating urine culture today

## 2022-08-19 NOTE — Assessment & Plan Note (Addendum)
Etiology unclear. Colonoscopy and path report reviewed.  Needs to complete stool study for pancreatic insufficiency. If normal,  Recommend elimination diet

## 2022-08-19 NOTE — Patient Instructions (Addendum)
The book I referred to today when we were discussing your GI issues is Dr Claris Pong Bennett's book "AIP Diet for Beginners: A Comprehensive Guide to the Autoimmune Paleo protocol"   You do not have a yeast infection.   I am prescribing a steroid cream to manage the irritation you are experiencing in your vaginal area. Apply twice daily for one week    A & D ointment helps with skin that has been irritated by your diarrhea

## 2022-08-21 ENCOUNTER — Ambulatory Visit (INDEPENDENT_AMBULATORY_CARE_PROVIDER_SITE_OTHER): Payer: Medicare Other

## 2022-08-21 DIAGNOSIS — E538 Deficiency of other specified B group vitamins: Secondary | ICD-10-CM

## 2022-08-21 MED ORDER — CYANOCOBALAMIN 1000 MCG/ML IJ SOLN
1000.0000 ug | Freq: Once | INTRAMUSCULAR | Status: AC
  Administered 2022-08-21: 1000 ug via INTRAMUSCULAR

## 2022-08-21 NOTE — Progress Notes (Signed)
Pt presented for B12 injection. Pt was identified through two identifiers. Pt tolerated shot well in the right deltoid.

## 2022-08-22 LAB — URINE CULTURE
MICRO NUMBER:: 13975752
SPECIMEN QUALITY:: ADEQUATE

## 2022-08-23 ENCOUNTER — Other Ambulatory Visit: Payer: Self-pay | Admitting: Internal Medicine

## 2022-08-23 DIAGNOSIS — E538 Deficiency of other specified B group vitamins: Secondary | ICD-10-CM

## 2022-08-23 DIAGNOSIS — E559 Vitamin D deficiency, unspecified: Secondary | ICD-10-CM

## 2022-08-23 MED ORDER — AMOXICILLIN-POT CLAVULANATE 875-125 MG PO TABS: 1.0000 | ORAL_TABLET | Freq: Two times a day (BID) | ORAL | 0 refills | Status: DC

## 2022-08-23 NOTE — Assessment & Plan Note (Signed)
Recurrent Will require IM supplementation

## 2022-08-23 NOTE — Assessment & Plan Note (Signed)
Recurrent.  Resume 2000 Ius dAILY

## 2022-08-31 ENCOUNTER — Ambulatory Visit (INDEPENDENT_AMBULATORY_CARE_PROVIDER_SITE_OTHER): Payer: Medicare Other

## 2022-08-31 DIAGNOSIS — E538 Deficiency of other specified B group vitamins: Secondary | ICD-10-CM | POA: Diagnosis not present

## 2022-08-31 MED ORDER — CYANOCOBALAMIN 1000 MCG/ML IJ SOLN
1000.0000 ug | Freq: Once | INTRAMUSCULAR | Status: AC
  Administered 2022-08-31: 1000 ug via INTRAMUSCULAR

## 2022-08-31 NOTE — Progress Notes (Signed)
Pt presented for vitamin B12 injection. Pt was identified through two identifiers. Pt tolerated shot well in the right deltoid.   Julie Perez, CMA

## 2022-09-07 ENCOUNTER — Ambulatory Visit (INDEPENDENT_AMBULATORY_CARE_PROVIDER_SITE_OTHER): Payer: Medicare Other | Admitting: *Deleted

## 2022-09-07 DIAGNOSIS — E538 Deficiency of other specified B group vitamins: Secondary | ICD-10-CM

## 2022-09-07 MED ORDER — CYANOCOBALAMIN 1000 MCG/ML IJ SOLN
1000.0000 ug | Freq: Once | INTRAMUSCULAR | Status: AC
  Administered 2022-09-07: 1000 ug via INTRAMUSCULAR

## 2022-09-07 NOTE — Progress Notes (Signed)
Pt received B12 injection in left deltoid & tolerated it well with no complaints.

## 2022-09-08 ENCOUNTER — Other Ambulatory Visit: Payer: Self-pay | Admitting: Internal Medicine

## 2022-09-14 ENCOUNTER — Ambulatory Visit: Payer: Medicare Other

## 2022-09-15 ENCOUNTER — Ambulatory Visit (INDEPENDENT_AMBULATORY_CARE_PROVIDER_SITE_OTHER): Payer: Medicare Other

## 2022-09-15 DIAGNOSIS — E538 Deficiency of other specified B group vitamins: Secondary | ICD-10-CM | POA: Diagnosis not present

## 2022-09-15 MED ORDER — CYANOCOBALAMIN 1000 MCG/ML IJ SOLN
1000.0000 ug | Freq: Once | INTRAMUSCULAR | Status: AC
  Administered 2022-09-15: 1000 ug via INTRAMUSCULAR

## 2022-09-15 NOTE — Progress Notes (Signed)
Pt arrived for B12 injection, given in L deltoid. Pt tolerated injection well, showed no signs of distress nor voiced any concerns.  ?

## 2022-09-21 ENCOUNTER — Ambulatory Visit (INDEPENDENT_AMBULATORY_CARE_PROVIDER_SITE_OTHER): Payer: Medicare Other

## 2022-09-21 DIAGNOSIS — E538 Deficiency of other specified B group vitamins: Secondary | ICD-10-CM | POA: Diagnosis not present

## 2022-09-21 MED ORDER — CYANOCOBALAMIN 1000 MCG/ML IJ SOLN
1000.0000 ug | Freq: Once | INTRAMUSCULAR | Status: AC
  Administered 2022-09-21: 1000 ug via INTRAMUSCULAR

## 2022-09-21 NOTE — Progress Notes (Signed)
Pt arrived for B12 injection, given in L deltoid. Pt tolerated injection well, showed no signs of distress nor voiced any concerns.  ?

## 2022-10-12 ENCOUNTER — Other Ambulatory Visit: Payer: Self-pay | Admitting: Internal Medicine

## 2022-10-12 DIAGNOSIS — Z1231 Encounter for screening mammogram for malignant neoplasm of breast: Secondary | ICD-10-CM

## 2022-10-26 ENCOUNTER — Ambulatory Visit (INDEPENDENT_AMBULATORY_CARE_PROVIDER_SITE_OTHER): Payer: Medicare Other

## 2022-10-26 DIAGNOSIS — E538 Deficiency of other specified B group vitamins: Secondary | ICD-10-CM | POA: Diagnosis not present

## 2022-10-26 MED ORDER — CYANOCOBALAMIN 1000 MCG/ML IJ SOLN
1000.0000 ug | Freq: Once | INTRAMUSCULAR | Status: AC
  Administered 2022-10-26: 1000 ug via INTRAMUSCULAR

## 2022-10-26 NOTE — Progress Notes (Signed)
Pt presented for their vitamin B12 injection. Pt was identified through two identifiers. Pt tolerated shot well in their right deltoid.  

## 2022-11-24 ENCOUNTER — Other Ambulatory Visit: Payer: Self-pay | Admitting: Internal Medicine

## 2022-11-26 MED ORDER — HYOSCYAMINE SULFATE 0.125 MG PO TBDP: 0.1250 mg | ORAL_TABLET | Freq: Four times a day (QID) | ORAL | 0 refills | Status: DC | PRN

## 2022-11-26 MED ORDER — MELOXICAM 15 MG PO TABS: 15.0000 mg | ORAL_TABLET | Freq: Every day | ORAL | 0 refills | Status: DC

## 2022-11-30 ENCOUNTER — Ambulatory Visit: Payer: BC Managed Care – PPO

## 2022-11-30 ENCOUNTER — Other Ambulatory Visit: Payer: Self-pay | Admitting: Internal Medicine

## 2022-11-30 DIAGNOSIS — E538 Deficiency of other specified B group vitamins: Secondary | ICD-10-CM

## 2022-11-30 MED ORDER — CYANOCOBALAMIN 1000 MCG/ML IJ SOLN
1000.0000 ug | Freq: Once | INTRAMUSCULAR | Status: AC
  Administered 2022-11-30: 1000 ug via INTRAMUSCULAR

## 2022-11-30 NOTE — Progress Notes (Signed)
Patient presented for B 12 injection to left deltoid, patient voiced no concerns nor showed any signs of distress during injection. 

## 2022-12-02 ENCOUNTER — Ambulatory Visit
Admission: RE | Admit: 2022-12-02 | Discharge: 2022-12-02 | Disposition: A | Discharge status: Home / Self Care | Payer: Medicare Other | Source: Ambulatory Visit | Attending: Internal Medicine | Admitting: Internal Medicine

## 2022-12-02 DIAGNOSIS — Z1231 Encounter for screening mammogram for malignant neoplasm of breast: Secondary | ICD-10-CM | POA: Diagnosis not present

## 2022-12-22 ENCOUNTER — Encounter: Payer: Self-pay | Admitting: Internal Medicine

## 2022-12-22 ENCOUNTER — Ambulatory Visit: Payer: Medicare Other | Admitting: Internal Medicine

## 2022-12-22 VITALS — BP 130/76 | HR 65 | Temp 98.5°F | Ht 65.0 in | Wt 227.2 lb

## 2022-12-22 DIAGNOSIS — E785 Hyperlipidemia, unspecified: Secondary | ICD-10-CM | POA: Diagnosis not present

## 2022-12-22 DIAGNOSIS — N952 Postmenopausal atrophic vaginitis: Secondary | ICD-10-CM

## 2022-12-22 DIAGNOSIS — Z1612 Extended spectrum beta lactamase (ESBL) resistance: Secondary | ICD-10-CM

## 2022-12-22 DIAGNOSIS — R350 Frequency of micturition: Secondary | ICD-10-CM

## 2022-12-22 DIAGNOSIS — N390 Urinary tract infection, site not specified: Secondary | ICD-10-CM | POA: Diagnosis not present

## 2022-12-22 DIAGNOSIS — B9629 Other Escherichia coli [E. coli] as the cause of diseases classified elsewhere: Secondary | ICD-10-CM

## 2022-12-22 DIAGNOSIS — E890 Postprocedural hypothyroidism: Secondary | ICD-10-CM

## 2022-12-22 LAB — POCT URINALYSIS DIPSTICK
Glucose, UA: NEGATIVE
Ketones, UA: NEGATIVE
Nitrite, UA: NEGATIVE
Protein, UA: NEGATIVE
Spec Grav, UA: >=1.03 — AB (ref 1.010–1.025)
Urobilinogen, UA: 1 E.U./dL
pH, UA: 5.5 (ref 5.0–8.0)

## 2022-12-22 MED ORDER — SCOPOLAMINE 1 MG/3DAYS TD PT72: 1.0000 | MEDICATED_PATCH | TRANSDERMAL | 0 refills | Status: DC

## 2022-12-22 MED ORDER — CRANBERRY-VITAMIN C-D MANNOSE 250-30-50 MG PO CHEW: 1.0000 | CHEWABLE_TABLET | Freq: Two times a day (BID) | ORAL | 2 refills | Status: DC

## 2022-12-22 NOTE — Assessment & Plan Note (Signed)
She was treated in early august with Augmentin for 7 days,  With persistent symptoms.  Repeat urine culture grew 10 to 50K colonies of E faecalis,  augmentin was repaeted

## 2022-12-22 NOTE — Patient Instructions (Addendum)
Your culture will take 48 hours to finalize,  so I will hold off on choosing an antibiotic until the,  you can continue to use azo if it helps.  If the urine  is negative for infection,  I will send in a medication for overactive bladder  TO PREVENT UTI'S:   Try taking a cranberry- D Mannose supplement (In pill form ) twice daily   TO WEAN YOUR CELEXA:  Reduce celexa to every other day for 10 days , then every 2 days for 5 days,  then STOP

## 2022-12-22 NOTE — Progress Notes (Unsigned)
Subjective:  Patient ID: Julie Perez, female    DOB: 09-28-1946  Age: 77 y.o. MRN: [id number]  CC: The primary encounter diagnosis was Frequent urination. Diagnoses of UTI due to extended-spectrum beta lactamase (ESBL) producing Escherichia coli, Hypothyroidism, postradioiodine therapy, Hyperlipidemia LDL goal <130, Postmenopausal atrophic vaginitis, and Burning with urination were also pertinent to this visit.   HPI Lan Entsminger presents for  Chief Complaint  Patient presents with   Urinary Frequency   77 yr old female with atrophic vaginitis , IBS with diarrhea predominance ,  urinary frequency and history of recurrent UTI presents with   1) urinary frequency occurring day and night  history of UTI x 2  In the past 6 month , treated in August.,  symptoms of dysuria persisted,  repeat culture in Sept gerw 10-50K colonies of E Faecalis .  Still having urgency and incontinence. No prior trial of anticholinergics for OAB    2) B12 deficiency  last injection was dec 4.  Had Weekly injections through October .   3) obesity: 9 days of dieting , has lost 3 lbs   incentive is [place].  Has not started exercising.  No fast food , no soft drinks.   Outpatient Medications Prior to Visit  Medication Sig Dispense Refill   amLODipine (NORVASC) 5 MG tablet TAKE 1 TABLET(5 MG) BY MOUTH AT BEDTIME 90 tablet 3   atorvastatin (LIPITOR) 40 MG tablet TAKE 1 TABLET(40 MG) BY MOUTH AT BEDTIME 90 tablet 3   clobetasol cream (TEMOVATE) 6.96 % Apply 1 Application topically 2 (two) times daily. To irritated skin 30 g 0   hyoscyamine (ANASPAZ) 0.125 MG TBDP disintergrating tablet Place 1 tablet (0.125 mg total) under the tongue every 6 (six) hours as needed. 30 tablet 0   levothyroxine (SYNTHROID) 112 MCG tablet TAKE 1 TABLET BY MOUTH DAILY BEFORE BREAKFAST 90 tablet 3   meloxicam (MOBIC) 15 MG tablet Take 1 tablet (15 mg total) by mouth daily. 30 tablet 0   omeprazole (PRILOSEC) 20 MG capsule TAKE  1 CAPSULE(20 MG) BY MOUTH DAILY 90 capsule 2   citalopram (CELEXA) 20 MG tablet TAKE 1 TABLET(20 MG) BY MOUTH DAILY 90 tablet 3   amoxicillin-clavulanate (AUGMENTIN) 875-125 MG tablet Take 1 tablet by mouth 2 (two) times daily. 14 tablet 0   phenazopyridine (PYRIDIUM) 200 MG tablet Take 1 tablet (200 mg total) by mouth 3 (three) times daily. 6 tablet 0   No facility-administered medications prior to visit.    Review of Systems;  Patient denies headache, fevers, malaise, unintentional weight loss, skin rash, eye pain, sinus congestion and sinus pain, sore throat, dysphagia,  hemoptysis , cough, dyspnea, wheezing, chest pain, palpitations, orthopnea, edema, abdominal pain, nausea, melena, diarrhea, constipation, flank pain, dysuria, hematuria, urinary  Frequency, nocturia, numbness, tingling, seizures,  Focal weakness, Loss of consciousness,  Tremor, insomnia, depression, anxiety, and suicidal ideation.      Objective:  BP 130/76   Pulse 65   Temp 98.5 F (36.9 C) (Oral)   Ht '5\' 5"'$  (1.651 m)   Wt 227 lb 3.2 oz (103.1 kg)   SpO2 97%   BMI 37.81 kg/m   BP Readings from Last 3 Encounters:  12/22/22 130/76  08/19/22 110/64  07/12/22 (!) 150/87    Wt Readings from Last 3 Encounters:  12/22/22 227 lb 3.2 oz (103.1 kg)  08/19/22 231 lb 6.4 oz (105 kg)  07/12/22 213 lb (96.6 kg)    Physical Exam Vitals reviewed.  Constitutional:      General: She is not in acute distress.    Appearance: Normal appearance. She is normal weight. She is not ill-appearing, toxic-appearing or diaphoretic.  HENT:     Head: Normocephalic.  Eyes:     General: No scleral icterus.       Right eye: No discharge.        Left eye: No discharge.     Conjunctiva/sclera: Conjunctivae normal.  Musculoskeletal:        General: Normal range of motion.  Skin:    General: Skin is warm and dry.  Neurological:     General: No focal deficit present.     Mental Status: She is alert and oriented to person, place,  and time. Mental status is at baseline.  Psychiatric:        Mood and Affect: Mood normal.        Behavior: Behavior normal.        Thought Content: Thought content normal.        Judgment: Judgment normal.     Lab Results  Component Value Date   HGBA1C 5.4 09/01/2019   HGBA1C 5.5 01/04/2017    Lab Results  Component Value Date   CREATININE 0.81 12/22/2022   CREATININE 0.74 08/19/2022   CREATININE 0.73 09/18/2021    Lab Results  Component Value Date   WBC 6.6 08/19/2022   HGB 13.3 08/19/2022   HCT 40.0 08/19/2022   PLT 208.0 08/19/2022   GLUCOSE 94 12/22/2022   CHOL 179 08/19/2022   TRIG 96.0 08/19/2022   HDL 51.00 08/19/2022   LDLDIRECT 113.0 05/20/2016   LDLCALC 108 (H) 08/19/2022   ALT 20 12/22/2022   AST 22 12/22/2022   NA 140 12/22/2022   K 3.9 12/22/2022   CL 102 12/22/2022   CREATININE 0.81 12/22/2022   BUN 16 12/22/2022   CO2 27 12/22/2022   TSH 4.64 12/22/2022   HGBA1C 5.4 09/01/2019    MM 3D SCREEN BREAST BILATERAL  Result Date: 12/02/2022 CLINICAL DATA:  Screening. EXAM: DIGITAL SCREENING BILATERAL MAMMOGRAM WITH TOMOSYNTHESIS AND CAD TECHNIQUE: Bilateral screening digital craniocaudal and mediolateral oblique mammograms were obtained. Bilateral screening digital breast tomosynthesis was performed. The images were evaluated with computer-aided detection. COMPARISON:  Previous exam(s). ACR Breast Density Category b: There are scattered areas of fibroglandular density. FINDINGS: There are no findings suspicious for malignancy. IMPRESSION: No mammographic evidence of malignancy. A result letter of this screening mammogram will be mailed directly to the patient. RECOMMENDATION: Screening mammogram in one year. (Code:SM-B-01Y) BI-RADS CATEGORY  1: Negative. Electronically Signed   By: Franki Cabot M.D.   On: 12/02/2022 15:27    Assessment & Plan:  .Frequent urination -     POCT urinalysis dipstick -     Urine Microscopic -     Urine Culture  UTI due to  extended-spectrum beta lactamase (ESBL) producing Escherichia coli Assessment & Plan: She was treated in early august with Augmentin for 7 days,  With persistent symptoms.  Repeat urine culture grew 10 to 50K colonies of E faecalis,  augmentin was repaeted    Hypothyroidism, postradioiodine therapy -     TSH  Hyperlipidemia LDL goal <130 -     Comprehensive metabolic panel  Postmenopausal atrophic vaginitis Assessment & Plan: Under the care of urology, using estrogen topical    Burning with urination Assessment & Plan: Waiting for UA and culture . If negative will start medication for OAB   Other orders -  Cranberry-Vitamin C-D Mannose; Chew 1 tablet by mouth 2 (two) times daily.  Dispense: 60 tablet; Refill: 2 -     Scopolamine; Place 1 patch (1.5 mg total) onto the skin every 3 (three) days.  Dispense: 4 patch; Refill: 0   Follow-up: Return in about 6 months (around 06/22/2023) for physical.   Crecencio Mc, MD

## 2022-12-23 LAB — URINE CULTURE
MICRO NUMBER:: 14493106
SPECIMEN QUALITY:: ADEQUATE

## 2022-12-23 LAB — COMPREHENSIVE METABOLIC PANEL
ALT: 20 U/L (ref 0–35)
AST: 22 U/L (ref 0–37)
Albumin: 4.6 g/dL (ref 3.5–5.2)
Alkaline Phosphatase: 56 U/L (ref 39–117)
BUN: 16 mg/dL (ref 6–23)
CO2: 27 mEq/L (ref 19–32)
Calcium: 9.4 mg/dL (ref 8.4–10.5)
Chloride: 102 mEq/L (ref 96–112)
Creatinine, Ser: 0.81 mg/dL (ref 0.40–1.20)
GFR: 70.95 mL/min (ref >60.00)
Glucose, Bld: 94 mg/dL (ref 70–99)
Potassium: 3.9 mEq/L (ref 3.5–5.1)
Sodium: 140 mEq/L (ref 135–145)
Total Bilirubin: 0.8 mg/dL (ref 0.2–1.2)
Total Protein: 7.1 g/dL (ref 6.0–8.3)

## 2022-12-23 LAB — URINALYSIS, MICROSCOPIC ONLY

## 2022-12-23 LAB — TSH: TSH: 4.64 u[IU]/mL (ref 0.35–5.50)

## 2022-12-23 NOTE — Assessment & Plan Note (Signed)
Waiting for UA and culture . If negative will start medication for OAB

## 2022-12-23 NOTE — Assessment & Plan Note (Signed)
Under the care of urology, using estrogen topical

## 2022-12-24 MED ORDER — SOLIFENACIN SUCCINATE 5 MG PO TABS: 5.0000 mg | ORAL_TABLET | Freq: Every day | ORAL | 2 refills | Status: DC

## 2022-12-24 NOTE — Addendum Note (Signed)
Addended by: Crecencio Mc on: 12/24/2022 05:59 AM   Modules accepted: Orders

## 2022-12-28 DIAGNOSIS — B078 Other viral warts: Secondary | ICD-10-CM | POA: Diagnosis not present

## 2022-12-28 DIAGNOSIS — L578 Other skin changes due to chronic exposure to nonionizing radiation: Secondary | ICD-10-CM | POA: Diagnosis not present

## 2022-12-28 DIAGNOSIS — Z872 Personal history of diseases of the skin and subcutaneous tissue: Secondary | ICD-10-CM | POA: Diagnosis not present

## 2022-12-30 ENCOUNTER — Other Ambulatory Visit: Payer: Self-pay

## 2022-12-30 DIAGNOSIS — E785 Hyperlipidemia, unspecified: Secondary | ICD-10-CM

## 2022-12-30 MED ORDER — ATORVASTATIN CALCIUM 40 MG PO TABS: ORAL_TABLET | ORAL | 3 refills | Status: DC

## 2023-01-04 ENCOUNTER — Ambulatory Visit: Payer: Medicare Other

## 2023-01-05 ENCOUNTER — Ambulatory Visit (INDEPENDENT_AMBULATORY_CARE_PROVIDER_SITE_OTHER): Payer: BC Managed Care – PPO

## 2023-01-05 DIAGNOSIS — E538 Deficiency of other specified B group vitamins: Secondary | ICD-10-CM

## 2023-01-05 MED ORDER — CYANOCOBALAMIN 1000 MCG/ML IJ SOLN
1000.0000 ug | Freq: Once | INTRAMUSCULAR | Status: AC
  Administered 2023-01-05: 1000 ug via INTRAMUSCULAR

## 2023-01-05 NOTE — Progress Notes (Signed)
Patient presented for B 12 injection to right deltoid, patient voiced no concerns nor showed any signs of distress during injection. 

## 2023-01-15 ENCOUNTER — Encounter: Payer: Self-pay | Admitting: Nurse Practitioner

## 2023-01-15 ENCOUNTER — Ambulatory Visit: Payer: BC Managed Care – PPO | Admitting: Nurse Practitioner

## 2023-01-15 VITALS — BP 131/81 | HR 70 | Temp 98.3°F | Ht 65.0 in | Wt 225.4 lb

## 2023-01-15 DIAGNOSIS — R42 Dizziness and giddiness: Secondary | ICD-10-CM

## 2023-01-15 NOTE — Progress Notes (Signed)
Tomasita Morrow, NP-C Phone: [phone]  Julie Perez is a 77 y.o. female who presents today for dizziness.  Patient reports new onset dizziness that started 2 days ago. Reports she has been tapering off of Celexa 20 mg. She was taking it every 2 days, however she has taken it the past 2 days. She also started on a new medication, Vesicare, within the last week. She has not taken this medication for the last 2 days. She also reports nausea and overall not feeling well for the last week. She does report overall improvement currently, she drank a Gatorade and Liquid IV that has improved her symptoms.   Dizziness  She reports new onset dizziness. She describes it as feeling like room is spinning and feeling unbalanced, occurs constantly, and typically lasts all day.  It typically occurs when she is lying back from sitting position, sitting up from lying position, turning head from side to side, tilting head up, tilting head down, bending forward, standing up from siting position, standing still, and walking. It is usually relieved by lying down, sitting still, closing eyes, rest, sleep, and drinking something. She has started new medications around the time the dizziness started, Vesicare.   Associated symptoms: No hearing loss No tinnitus  No chest discomfort No heart palpitations  No heart racing No numbness or tingling of extremities  Yes nausea Yes vomiting  No speech difficulty No visual changes    Lab Results  Component Value Date   WBC 6.6 08/19/2022   HGB 13.3 08/19/2022   HCT 40.0 08/19/2022   MCV 89.5 08/19/2022   PLT 208.0 08/19/2022   Lab Results  Component Value Date   NA 140 12/22/2022   K 3.9 12/22/2022   CO2 27 12/22/2022   BUN 16 12/22/2022   CREATININE 0.81 12/22/2022   CALCIUM 9.4 12/22/2022   GLUCOSE 94 12/22/2022     ---------------------------------------------------------------------------------------------------   Social History   Tobacco Use   Smoking Status Never  Smokeless Tobacco Never  Tobacco Comments   smoking cessation materials not required    Current Outpatient Medications on File Prior to Visit  Medication Sig Dispense Refill   amLODipine (NORVASC) 5 MG tablet TAKE 1 TABLET(5 MG) BY MOUTH AT BEDTIME 90 tablet 3   atorvastatin (LIPITOR) 40 MG tablet TAKE 1 TABLET(40 MG) BY MOUTH AT BEDTIME 90 tablet 3   clobetasol cream (TEMOVATE) [id number] % Apply 1 Application topically 2 (two) times daily. To irritated skin 30 g 0   Cranberry-Vitamin C-D Mannose 250-30-50 MG CHEW Chew 1 tablet by mouth 2 (two) times daily. 60 tablet 2   hyoscyamine (ANASPAZ) 0.125 MG TBDP disintergrating tablet Place 1 tablet (0.125 mg total) under the tongue every 6 (six) hours as needed. 30 tablet 0   levothyroxine (SYNTHROID) 112 MCG tablet TAKE 1 TABLET BY MOUTH DAILY BEFORE BREAKFAST 90 tablet 3   meloxicam (MOBIC) 15 MG tablet Take 1 tablet (15 mg total) by mouth daily. 30 tablet 0   omeprazole (PRILOSEC) 20 MG capsule TAKE 1 CAPSULE(20 MG) BY MOUTH DAILY 90 capsule 2   scopolamine (TRANSDERM-SCOP) 1 MG/3DAYS Place 1 patch (1.5 mg total) onto the skin every 3 (three) days. 4 patch 0   solifenacin (VESICARE) 5 MG tablet Take 1 tablet (5 mg total) by mouth daily. 90 tablet 2   No current facility-administered medications on file prior to visit.     ROS see history of present illness  Objective  Physical Exam Vitals:   01/15/23 1406 01/15/23  1407  BP: (!) 140/82 131/81  Pulse: 66 70  Temp:      BP Readings from Last 3 Encounters:  01/15/23 131/81  12/22/22 130/76  08/19/22 110/64   Wt Readings from Last 3 Encounters:  01/15/23 225 lb 6.4 oz (102.2 kg)  12/22/22 227 lb 3.2 oz (103.1 kg)  08/19/22 231 lb 6.4 oz (105 kg)    Physical Exam Constitutional:      General: She is not in acute distress.    Appearance: Normal appearance.  HENT:     Head: Normocephalic.     Right Ear: Tympanic membrane normal.     Left Ear: Tympanic  membrane normal.  Eyes:     Extraocular Movements: Extraocular movements intact.     Pupils: Pupils are equal, round, and reactive to light.  Cardiovascular:     Rate and Rhythm: Normal rate and regular rhythm.     Heart sounds: Normal heart sounds.  Pulmonary:     Effort: Pulmonary effort is normal.     Breath sounds: Normal breath sounds.  Skin:    General: Skin is warm and dry.  Neurological:     General: No focal deficit present.     Mental Status: She is alert and oriented to person, place, and time.     Motor: Motor function is intact.     Coordination: Coordination is intact. Finger-Nose-Finger Test normal.     Comments: CN 2-12 intact, 5/5 strength in bilateral biceps, triceps, grip, quads, hamstrings, plantar and dorsiflexion, sensation to light touch intact in bilateral UE and LE, normal gait.   Psychiatric:        Mood and Affect: Mood normal.        Behavior: Behavior normal.    Assessment/Plan: Please see individual problem list.  Dizziness Assessment & Plan: Symptoms gradually improving. Neuro exam WNL. Offered patient MRI of Brain due to nature of symptoms, she politely declined. She would like to wait and see if her symptoms do not continue to improve or come back. Discussed with patient possibility of medication side effect, Celexa withdrawal vs new start on Vesicare. She is going to remain on her Celexa daily, since she has taken it the past 2 days and stop the Vesicare, since she has not been taking it. Will check CBC and CMP. Encouraged adequate hydration. Strict return precautions given to patient. Advised to seek medical attention if symptoms are worsening or changing.   Orders: -     CBC with Differential/Platelet -     Comprehensive metabolic panel   Return if symptoms worsen or fail to improve.   Tomasita Morrow, NP-C [place]

## 2023-01-15 NOTE — Assessment & Plan Note (Addendum)
Symptoms gradually improving. Neuro exam WNL. Offered patient MRI of Brain due to nature of symptoms, she politely declined. She would like to wait and see if her symptoms do not continue to improve or come back. Discussed with patient possibility of medication side effect, Celexa withdrawal vs new start on Vesicare. She is going to remain on her Celexa daily, since she has taken it the past 2 days and stop the Vesicare, since she has not been taking it. Will check CBC and CMP. Encouraged adequate hydration. Strict return precautions given to patient. Advised to seek medical attention if symptoms are worsening or changing.

## 2023-01-15 NOTE — Patient Instructions (Signed)
It was nice to meet you!  I will contact you with your lab results.   Please seek care if your symptoms do not continue to improve over the weekend or are worsening.

## 2023-01-16 LAB — COMPREHENSIVE METABOLIC PANEL
AG Ratio: 1.6 (calc) (ref 1.0–2.5)
ALT: 23 U/L (ref 6–29)
AST: 18 U/L (ref 10–35)
Albumin: 4.6 g/dL (ref 3.6–5.1)
Alkaline phosphatase (APISO): 64 U/L (ref 37–153)
BUN: 15 mg/dL (ref 7–25)
CO2: 22 mmol/L (ref 20–32)
Calcium: 9.8 mg/dL (ref 8.6–10.4)
Chloride: 103 mmol/L (ref 98–110)
Creat: 0.77 mg/dL (ref 0.60–1.00)
Globulin: 2.9 g/dL (calc) (ref 1.9–3.7)
Glucose, Bld: 115 mg/dL — ABNORMAL HIGH (ref 65–99)
Potassium: 4.2 mmol/L (ref 3.5–5.3)
Sodium: 140 mmol/L (ref 135–146)
Total Bilirubin: 1 mg/dL (ref 0.2–1.2)
Total Protein: 7.5 g/dL (ref 6.1–8.1)

## 2023-01-16 LAB — CBC WITH DIFFERENTIAL/PLATELET
Absolute Monocytes: 484 cells/uL (ref 200–950)
Basophils Absolute: 23 cells/uL (ref 0–200)
Basophils Relative: 0.3 %
Eosinophils Absolute: 78 cells/uL (ref 15–500)
Eosinophils Relative: 1 %
HCT: 43.8 % (ref 35.0–45.0)
Hemoglobin: 14.9 g/dL (ref 11.7–15.5)
Lymphs Abs: 1849 cells/uL (ref 850–3900)
MCH: 29.9 pg (ref 27.0–33.0)
MCHC: 34 g/dL (ref 32.0–36.0)
MCV: 87.8 fL (ref 80.0–100.0)
MPV: 11.5 fL (ref 7.5–12.5)
Monocytes Relative: 6.2 %
Neutro Abs: 5366 cells/uL (ref 1500–7800)
Neutrophils Relative %: 68.8 %
Platelets: 254 10*3/uL (ref 140–400)
RBC: 4.99 10*6/uL (ref 3.80–5.10)
RDW: 13.6 % (ref 11.0–15.0)
Total Lymphocyte: 23.7 %
WBC: 7.8 10*3/uL (ref 3.8–10.8)

## 2023-02-02 ENCOUNTER — Other Ambulatory Visit: Payer: Self-pay | Admitting: Internal Medicine

## 2023-02-03 MED ORDER — MELOXICAM 15 MG PO TABS: 15.0000 mg | ORAL_TABLET | Freq: Every day | ORAL | 0 refills | Status: DC

## 2023-02-03 MED ORDER — HYOSCYAMINE SULFATE 0.125 MG PO TBDP: 0.1250 mg | ORAL_TABLET | Freq: Four times a day (QID) | ORAL | 0 refills | Status: DC | PRN

## 2023-02-03 MED ORDER — CLOBETASOL PROPIONATE 0.05 % EX CREA: 1.0000 | TOPICAL_CREAM | Freq: Two times a day (BID) | CUTANEOUS | 0 refills | Status: AC

## 2023-02-04 ENCOUNTER — Ambulatory Visit (INDEPENDENT_AMBULATORY_CARE_PROVIDER_SITE_OTHER): Payer: BC Managed Care – PPO

## 2023-02-04 DIAGNOSIS — E538 Deficiency of other specified B group vitamins: Secondary | ICD-10-CM | POA: Diagnosis not present

## 2023-02-04 MED ORDER — CYANOCOBALAMIN 1000 MCG/ML IJ SOLN
1000.0000 ug | Freq: Once | INTRAMUSCULAR | Status: AC
  Administered 2023-02-04: 1000 ug via INTRAMUSCULAR

## 2023-02-04 NOTE — Progress Notes (Signed)
Patient presented for B 12 injection to left deltoid, patient voiced no concerns nor showed any signs of distress during injection. 

## 2023-03-08 ENCOUNTER — Telehealth: Payer: Self-pay | Admitting: Internal Medicine

## 2023-03-08 ENCOUNTER — Ambulatory Visit (INDEPENDENT_AMBULATORY_CARE_PROVIDER_SITE_OTHER): Payer: BC Managed Care – PPO

## 2023-03-08 DIAGNOSIS — I10 Essential (primary) hypertension: Secondary | ICD-10-CM

## 2023-03-08 DIAGNOSIS — E538 Deficiency of other specified B group vitamins: Secondary | ICD-10-CM | POA: Diagnosis not present

## 2023-03-08 DIAGNOSIS — E890 Postprocedural hypothyroidism: Secondary | ICD-10-CM

## 2023-03-08 MED ORDER — CYANOCOBALAMIN 1000 MCG/ML IJ SOLN
1000.0000 ug | Freq: Once | INTRAMUSCULAR | Status: AC
Start: 2023-03-08 — End: 2023-03-08
  Administered 2023-03-08: 1000 ug via INTRAMUSCULAR

## 2023-03-08 MED ORDER — AMLODIPINE BESYLATE 5 MG PO TABS
ORAL_TABLET | ORAL | 0 refills | Status: DC
Start: 2023-03-08 — End: 2023-03-29

## 2023-03-08 MED ORDER — LEVOTHYROXINE SODIUM 112 MCG PO TABS
112.0000 ug | ORAL_TABLET | Freq: Every day | ORAL | 0 refills | Status: DC
Start: 2023-03-08 — End: 2023-03-29

## 2023-03-08 NOTE — Progress Notes (Signed)
Pt presented for their vitamin B12 injection. Pt was identified through two identifiers. Pt tolerated shot well in their right deltoid.  

## 2023-03-08 NOTE — Telephone Encounter (Signed)
Pt is calling in stating that she is needing a refill on her 2 Rx's amlodipine (NORVASC) 5 MG and levothyroxine (SYNTHROID) 112 MG Pharm:  [facility] in [place], [place] (on [address]).  Pt state that she only has 5-7 days of medication and would like to see if it can be called in today due to her going out of town the end of the week.

## 2023-03-08 NOTE — Telephone Encounter (Signed)
Medications have been refilled for 30 days only. Pt is overdue for an appt. Spoke with pt and she scheduled an office appt for May 6th.

## 2023-03-19 ENCOUNTER — Other Ambulatory Visit: Payer: Self-pay | Admitting: Internal Medicine

## 2023-03-22 ENCOUNTER — Telehealth: Payer: Self-pay | Admitting: Internal Medicine

## 2023-03-22 ENCOUNTER — Ambulatory Visit
Admission: EM | Admit: 2023-03-22 | Discharge: 2023-03-22 | Disposition: A | Discharge status: Home / Self Care | Payer: BC Managed Care – PPO | Attending: Family Medicine | Admitting: Family Medicine

## 2023-03-22 ENCOUNTER — Telehealth: Payer: BC Managed Care – PPO | Admitting: Family

## 2023-03-22 DIAGNOSIS — U071 COVID-19: Secondary | ICD-10-CM

## 2023-03-22 LAB — SARS CORONAVIRUS 2 BY RT PCR: SARS Coronavirus 2 by RT PCR: POSITIVE — AB

## 2023-03-22 MED ORDER — PAXLOVID (300/100) 20 X 150 MG & 10 X 100MG PO TBPK: 3.0000 | ORAL_TABLET | Freq: Two times a day (BID) | ORAL | 0 refills | Status: AC

## 2023-03-22 NOTE — ED Provider Notes (Signed)
[facility]    CSN: [id number] Arrival date & time: 03/22/23  0935      History   Chief Complaint Chief Complaint  Patient presents with   Covid Positive   Cough   Sore Throat    HPI Julie Perez is a 77 y.o. female.   HPI   Julie Perez presents after testing positive ofr COVID at home.  She was unsure if her test was correct so she came in to be retested.  She has been having cough, chills, nasal congestion after returning from a cruise.       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   Echo June 2019   S/P hysterectomy 09/12/2014   Due to fibroids,    Thyroid disease     Patient Active Problem List   Diagnosis Date Noted   Dizziness 01/15/2023   UTI due to extended-spectrum beta lactamase (ESBL) producing Escherichia coli 08/19/2022   Vaginal irritation 08/19/2022   IBS (irritable bowel syndrome) 08/19/2022   Chronic GERD    Chronic diarrhea    Aphthous ulcer of ileum    Esophageal dysphagia 09/21/2021   Urinary hesitancy 06/29/2021   Strain of gastrocnemius tendon 06/13/2021   Pain of right lower extremity 04/22/2021   Popliteal cyst, right 04/22/2021   Incontinence of feces with fecal urgency 07/14/2020   Mild concentric left ventricular hypertrophy 05/17/2018   Grade I diastolic dysfunction 05/17/2018   DDD (degenerative disc disease), lumbar 02/07/2018   Right lumbar radiculopathy 07/05/2017   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   Obesity (BMI 30.0-34.9) 01/05/2017   Urinary frequency 06/18/2016   Fatigue 05/26/2015   Cardiomegaly 04/20/2015   Essential hypertension 04/20/2015   Chronic cough 11/29/2014   Long-term use of high-risk  medication 11/19/2014   Cephalalgia 10/02/2014   Postmenopausal atrophic vaginitis 09/15/2014   Generalized anxiety disorder 09/15/2014   B12 deficiency 06/22/2014   Vitamin D deficiency 06/22/2014   OSA (obstructive sleep apnea) 06/22/2014   Hypothyroidism, postradioiodine therapy 06/20/2014   Hyperlipidemia LDL goal <130 04/09/2008    Past Surgical History:  Procedure Laterality Date   ABDOMINAL HYSTERECTOMY  1979   COLONOSCOPY WITH PROPOFOL N/A 12/10/2021   Procedure: COLONOSCOPY WITH PROPOFOL;  Surgeon: Toney Reil, MD;  Location: [facility] END[facility]COPY;  Service: Gastroenterology;  Laterality: N/A;   ESOPHAGOGASTRODUODENOSCOPY N/A 12/10/2021   Procedure: ESOPHAGOGASTRODUODENOSCOPY (EGD);  Surgeon: Toney Reil, MD;  Location: [facility] ENDOSCOPY;  Service: Gastroenterology;  Laterality: N/A;   SHOULDER ARTHROSCOPY WITH ROTATOR CUFF REPAIR Left 09/14/2019   Procedure: SHOULDER ARTHROSCOPY WITH ROTATOR CUFF REPAIR;  Surgeon: Lyndle Herrlich, MD;  Location: [facility];  Service: Orthopedics;  Laterality: Left;   VAGINAL HYSTERECTOMY  1979    OB History   No obstetric history on file.      Home Medications    Prior to Admission medications   Medication Sig Start Date End Date Taking? Authorizing Provider  amLODipine (NORVASC) 5 MG tablet TAKE 1 TABLET(5 MG) BY MOUTH AT BEDTIME 03/08/23  Yes Sherlene Shams, MD  atorvastatin (LIPITOR) 40 MG tablet TAKE 1 TABLET(40 MG) BY MOUTH AT BEDTIME  12/30/22  Yes Sherlene Shams, MD  citalopram (CELEXA) 20 MG tablet Take 20 mg by mouth daily. 03/19/23  Yes [provider]  clobetasol cream (TEMOVATE) 0.05 % Apply 1 Application topically 2 (two) times daily. To irritated skin 02/03/23  Yes Sherlene Shams, MD  Cranberry-Vitamin C-D Mannose 250-30-50 MG CHEW Chew 1 tablet by mouth 2 (two) times daily. 12/22/22  Yes Sherlene Shams, MD  hyoscyamine (ANASPAZ) 0.125 MG TBDP disintergrating tablet Place 1 tablet (0.125 mg total) under  the tongue every 6 (six) hours as needed. 02/03/23  Yes Sherlene Shams, MD  levothyroxine (SYNTHROID) 112 MCG tablet Take 1 tablet (112 mcg total) by mouth daily before breakfast. 03/08/23  Yes Sherlene Shams, MD  meloxicam (MOBIC) 15 MG tablet TAKE 1 TABLET(15 MG) BY MOUTH DAILY 03/19/23  Yes Sherlene Shams, MD  nirmatrelvir & ritonavir (PAXLOVID, 300/100,) 20 x 150 MG & 10 x 100MG  TBPK Take 3 tablets by mouth 2 (two) times daily for 5 days. 03/22/23 03/27/23 Yes Shonta Phillis, DO  omeprazole (PRILOSEC) 20 MG capsule TAKE 1 CAPSULE(20 MG) BY MOUTH DAILY 11/30/22  Yes Sherlene Shams, MD  scopolamine (TRANSDERM-SCOP) 1 MG/3DAYS Place 1 patch (1.5 mg total) onto the skin every 3 (three) days. 12/22/22  Yes Sherlene Shams, MD  solifenacin (VESICARE) 5 MG tablet Take 1 tablet (5 mg total) by mouth daily. 12/24/22  Yes Sherlene Shams, MD    Family History Family History  Problem Relation Age of Onset   Hypertension Mother    Alzheimer's disease Mother    Cancer Father 21       prostate cancer   Cancer Sister 53       breast cancer   Breast cancer Sister 60   Aneurysm Sister    Diabetes Brother        type 2   Heart disease Sister        cabg x 4, tobacco abuse   Diabetes Sister        type 2   Lung cancer Nephew     Social History Social History   Tobacco Use   Smoking status: Never   Smokeless tobacco: Never   Tobacco comments:    smoking cessation materials not required  Vaping Use   Vaping Use: Never used  Substance Use Topics   Alcohol use: Yes    Alcohol/week: 1.0 standard drink of alcohol    Types: 1 Glasses of wine per week    Comment: rare   Drug use: No     Allergies   Patient has no known allergies.   Review of Systems Review of Systems: negative unless otherwise stated in HPI.      Physical Exam Triage Vital Signs ED Triage Vitals  Enc Vitals Group     BP 03/22/23 1056 124/72     Pulse Rate 03/22/23 1056 62     Resp 03/22/23 1056 16     Temp 03/22/23  1056 98.3 F (36.8 C)     Temp Source 03/22/23 1056 Oral     SpO2 03/22/23 1056 95 %     Weight 03/22/23 1054 230 lb (104.3 kg)     Height 03/22/23 1054 5\' 6"  (1.676 m)     Head Circumference --      Peak Flow --      Pain Score 03/22/23 1059 4     Pain Loc --      Pain Edu? --  Excl. in GC? --    No data found.  Updated Vital Signs BP 124/72 (BP Location: Left Arm)   Pulse 62   Temp 98.3 F (36.8 C) (Oral)   Resp 16   Ht 5\' 6"  (1.676 m)   Wt 104.3 kg   SpO2 95%   BMI 37.12 kg/m   Visual Acuity Right Eye Distance:   Left Eye Distance:   Bilateral Distance:    Right Eye Near:   Left Eye Near:    Bilateral Near:     Physical Exam GEN:     alert, non-toxic appearing elderly female in no distress    HENT:  mucus membranes moist, oropharyngeal without lesions or exudate, no tonsillar hypertrophy, mild oropharyngeal erythema, clear nasal discharge EYES:   pupils equal and reactive, no scleral injection or discharge NECK:  normal ROM, no meningismus   RESP:  no increased work of breathing, clear to auscultation bilaterally CVS:   regular rate and rhythm Skin:   warm and dry, no rash on visible skin    UC Treatments / Results  Labs (all labs ordered are listed, but only abnormal results are displayed) Labs Reviewed  SARS CORONAVIRUS 2 BY RT PCR - Abnormal; Notable for the following components:      Result Value   SARS Coronavirus 2 by RT PCR POSITIVE (*)    All other components within normal limits    EKG   Radiology No results found.  Procedures Procedures (including critical care time)  Medications Ordered in UC Medications - No data to display  Initial Impression / Assessment and Plan / UC Course  I have reviewed the triage vital signs and the nursing notes.  Pertinent labs & imaging results that were available during my care of the patient were reviewed by me and considered in my medical decision making (see chart for details).        Patient is a 77 y.o. female presents after testing positive for at home for COVID. Overall she is non-toxic appearing, well-hydrated and in no respiratory distress. COVID testing obtained and she was positive for COVID. Discussed symptomatic treatment. Pulmonary exam she has equal aeration bilaterally, imaging deferred.  She is unsure about treatment with anti-viral.  After shared decision making, she is interested in Paxlovid.  Rx sent to pharmacy.  Isolation and quarantine instructions provided. Typical duration of symptoms discussed. ED and return precautions and understanding voiced. Discussed MDM, treatment plan and plan for follow-up with patient who agrees with plan.     Final Clinical Impressions(s) / UC Diagnoses   Final diagnoses:  COVID-19     Discharge Instructions      If you decide to take the Paxlovid: take your Vesicare and Amlodipine every other day while taking paxlovid   Your test for COVID-19 was positive, meaning that you were infected with the novel coronavirus and could give the germ to others.  The recommendations suggest returning to normal activities when, for at least 24 hours, symptoms are improving overall, and if a fever was present, it has been gone without use of a fever-reducing medication.  You should wear a mask for the next 5 days to prevent the spread of disease. Please continue good preventive care measures, including:  frequent hand-washing, avoid touching your face, cover coughs/sneezes, stay out of crowds and keep a 6 foot distance from others.  Go to the nearest hospital emergency room if fever/cough/breathlessness are severe or illness seems like a threat to life.  If  your were prescribed medication. Stop by the pharmacy to pick it up. You can take Tylenol and/or Ibuprofen as needed for fever reduction and pain relief.    For cough: honey 1/2 to 1 teaspoon (you can dilute the honey in water or another fluid).  You can also use guaifenesin and  dextromethorphan for cough. You can use a humidifier for chest congestion and cough.  If you don't have a humidifier, you can sit in the bathroom with the hot shower running.      For sore throat: try warm salt water gargles, Mucinex sore throat cough drops or cepacol lozenges, throat spray, warm tea or water with lemon/honey, popsicles or ice, or OTC cold relief medicine for throat discomfort. You can also purchase chloraseptic spray at the pharmacy or dollar store.   For congestion: take a daily anti-histamine like Zyrtec, Claritin, and a oral decongestant, such as pseudoephedrine.  You can also use Flonase 1-2 sprays in each nostril daily. Afrin is also a good option, if you do not have high blood pressure.    It is important to stay hydrated: drink plenty of fluids (water, gatorade/powerade/pedialyte, juices, or teas) to keep your throat moisturized and help further relieve irritation/discomfort.    Return or go to the Emergency Department if symptoms worsen or do not improve in the next few days      ED Prescriptions     Medication Sig Dispense Auth. Provider   nirmatrelvir & ritonavir (PAXLOVID, 300/100,) 20 x 150 MG & 10 x 100MG  TBPK Take 3 tablets by mouth 2 (two) times daily for 5 days. 30 tablet Katha Cabal, DO      PDMP not reviewed this encounter.   Katha Cabal, DO 03/22/23 1228

## 2023-03-22 NOTE — Telephone Encounter (Signed)
Contacted [name] to schedule their annual wellness visit. Appointment made for 04/05/2023.  Verlee Rossetti; Care Guide Ambulatory Clinical Support [facility] Direct Dial: [phone]

## 2023-03-22 NOTE — ED Triage Notes (Signed)
Pt c/o cough & sore throat x1 day. States she tested + for covid at home yesterday. Was recently on a cruise, denies any otc tx.

## 2023-03-22 NOTE — Discharge Instructions (Signed)
If you decide to take the Paxlovid: take your Vesicare and Amlodipine every other day while taking paxlovid   Your test for COVID-19 was positive, meaning that you were infected with the novel coronavirus and could give the germ to others.  The recommendations suggest returning to normal activities when, for at least 24 hours, symptoms are improving overall, and if a fever was present, it has been gone without use of a fever-reducing medication.  You should wear a mask for the next 5 days to prevent the spread of disease. Please continue good preventive care measures, including:  frequent hand-washing, avoid touching your face, cover coughs/sneezes, stay out of crowds and keep a 6 foot distance from others.  Go to the nearest hospital emergency room if fever/cough/breathlessness are severe or illness seems like a threat to life.  If your were prescribed medication. Stop by the pharmacy to pick it up. You can take Tylenol and/or Ibuprofen as needed for fever reduction and pain relief.    For cough: honey 1/2 to 1 teaspoon (you can dilute the honey in water or another fluid).  You can also use guaifenesin and dextromethorphan for cough. You can use a humidifier for chest congestion and cough.  If you don't have a humidifier, you can sit in the bathroom with the hot shower running.      For sore throat: try warm salt water gargles, Mucinex sore throat cough drops or cepacol lozenges, throat spray, warm tea or water with lemon/honey, popsicles or ice, or OTC cold relief medicine for throat discomfort. You can also purchase chloraseptic spray at the pharmacy or dollar store.   For congestion: take a daily anti-histamine like Zyrtec, Claritin, and a oral decongestant, such as pseudoephedrine.  You can also use Flonase 1-2 sprays in each nostril daily. Afrin is also a good option, if you do not have high blood pressure.    It is important to stay hydrated: drink plenty of fluids (water,  gatorade/powerade/pedialyte, juices, or teas) to keep your throat moisturized and help further relieve irritation/discomfort.    Return or go to the Emergency Department if symptoms worsen or do not improve in the next few days

## 2023-03-29 ENCOUNTER — Ambulatory Visit: Payer: BC Managed Care – PPO | Admitting: Internal Medicine

## 2023-03-29 ENCOUNTER — Encounter: Payer: Self-pay | Admitting: Internal Medicine

## 2023-03-29 VITALS — BP 124/78 | HR 56 | Temp 97.9°F | Ht 66.0 in | Wt 231.4 lb

## 2023-03-29 DIAGNOSIS — E890 Postprocedural hypothyroidism: Secondary | ICD-10-CM | POA: Diagnosis not present

## 2023-03-29 DIAGNOSIS — R053 Chronic cough: Secondary | ICD-10-CM

## 2023-03-29 DIAGNOSIS — E785 Hyperlipidemia, unspecified: Secondary | ICD-10-CM

## 2023-03-29 DIAGNOSIS — E538 Deficiency of other specified B group vitamins: Secondary | ICD-10-CM

## 2023-03-29 DIAGNOSIS — Z79899 Other long term (current) drug therapy: Secondary | ICD-10-CM | POA: Diagnosis not present

## 2023-03-29 DIAGNOSIS — R1319 Other dysphagia: Secondary | ICD-10-CM

## 2023-03-29 DIAGNOSIS — U071 COVID-19: Secondary | ICD-10-CM

## 2023-03-29 DIAGNOSIS — I10 Essential (primary) hypertension: Secondary | ICD-10-CM | POA: Diagnosis not present

## 2023-03-29 DIAGNOSIS — K529 Noninfective gastroenteritis and colitis, unspecified: Secondary | ICD-10-CM

## 2023-03-29 MED ORDER — AMLODIPINE BESYLATE 5 MG PO TABS: ORAL_TABLET | ORAL | 1 refills | Status: DC

## 2023-03-29 MED ORDER — LEVOTHYROXINE SODIUM 112 MCG PO TABS
112.0000 ug | ORAL_TABLET | Freq: Every day | ORAL | 1 refills | Status: DC
Start: 2023-03-29 — End: 2023-07-01

## 2023-03-29 NOTE — Progress Notes (Unsigned)
Subjective:  Patient ID: Julie Perez, female    DOB: 04-02-1946  Age: 77 y.o. MRN: [id number]  CC: The primary encounter diagnosis was Hyperlipidemia LDL goal <130. Diagnoses of Essential hypertension, Hypothyroidism, postradioiodine therapy, Long-term use of high-risk medication, COVID-19 virus infection, Chronic diarrhea, Chronic cough, and Esophageal dysphagia were also pertinent to this visit.   HPI Julie Perez presents for  Chief Complaint  Patient presents with   Medical Management of Chronic Issues   1) She was diagnosed with COVID on Apri 29 after returning home from [place] with ten  friends .  Productive cough and LG fevers.    She  was prescribed but did not take Paxlovid . Felt better after 2-3 days   2) chronic diarrhea:  not occurring daily, managed with prn imodium   3) GERD :  with cough/dysphagia  taking sublingual  . Modifying  diet and eating slower.   4) had an episode of vertigo  with emesis in late Feb :  she was treated by Catalina Antigua.    She resumed celexa bc she attributed symptoms to the celexa wean.    Has also resumed vesicare with no recurrence   5) OAB:  tolerating vesicare   6) Obesity:  reviewed weight gain .  Drinking soda's   7) B12 deficiency :  due for b12 shot    Outpatient Medications Prior to Visit  Medication Sig Dispense Refill   atorvastatin (LIPITOR) 40 MG tablet TAKE 1 TABLET(40 MG) BY MOUTH AT BEDTIME 90 tablet 3   citalopram (CELEXA) 20 MG tablet Take 20 mg by mouth daily.     clobetasol cream (TEMOVATE) 0.05 % Apply 1 Application topically 2 (two) times daily. To irritated skin 30 g 0   hyoscyamine (ANASPAZ) 0.125 MG TBDP disintergrating tablet Place 1 tablet (0.125 mg total) under the tongue every 6 (six) hours as needed. 30 tablet 0   meloxicam (MOBIC) 15 MG tablet TAKE 1 TABLET(15 MG) BY MOUTH DAILY 30 tablet 0   omeprazole (PRILOSEC) 20 MG capsule TAKE 1 CAPSULE(20 MG) BY MOUTH DAILY 90 capsule 2   solifenacin  (VESICARE) 5 MG tablet Take 1 tablet (5 mg total) by mouth daily. 90 tablet 2   amLODipine (NORVASC) 5 MG tablet TAKE 1 TABLET(5 MG) BY MOUTH AT BEDTIME 30 tablet 0   Cranberry-Vitamin C-D Mannose 250-30-50 MG CHEW Chew 1 tablet by mouth 2 (two) times daily. (Patient not taking: Reported on 03/29/2023) 60 tablet 2   levothyroxine (SYNTHROID) 112 MCG tablet Take 1 tablet (112 mcg total) by mouth daily before breakfast. 30 tablet 0   scopolamine (TRANSDERM-SCOP) 1 MG/3DAYS Place 1 patch (1.5 mg total) onto the skin every 3 (three) days. 4 patch 0   No facility-administered medications prior to visit.    Review of Systems;  Patient denies headache, fevers, malaise, unintentional weight loss, skin rash, eye pain, sinus congestion and sinus pain, sore throat, dysphagia,  hemoptysis , cough, dyspnea, wheezing, chest pain, palpitations, orthopnea, edema, abdominal pain, nausea, melena, diarrhea, constipation, flank pain, dysuria, hematuria, urinary  Frequency, nocturia, numbness, tingling, seizures,  Focal weakness, Loss of consciousness,  Tremor, insomnia, depression, anxiety, and suicidal ideation.      Objective:  BP 124/78   Pulse (!) 56   Temp 97.9 F (36.6 C) (Oral)   Ht 5\' 6"  (1.676 m)   Wt 231 lb 6.4 oz (105 kg)   SpO2 97%   BMI 37.35 kg/m   BP Readings from Last 3  Encounters:  03/29/23 124/78  03/22/23 124/72  01/15/23 131/81    Wt Readings from Last 3 Encounters:  03/29/23 231 lb 6.4 oz (105 kg)  03/22/23 230 lb (104.3 kg)  01/15/23 225 lb 6.4 oz (102.2 kg)    Physical Exam  Lab Results  Component Value Date   HGBA1C 5.4 09/01/2019   HGBA1C 5.5 01/04/2017    Lab Results  Component Value Date   CREATININE 0.77 01/15/2023   CREATININE 0.81 12/22/2022   CREATININE 0.74 08/19/2022    Lab Results  Component Value Date   WBC 7.8 01/15/2023   HGB 14.9 01/15/2023   HCT 43.8 01/15/2023   PLT 254 01/15/2023   GLUCOSE 115 (H) 01/15/2023   CHOL 179 08/19/2022   TRIG  96.0 08/19/2022   HDL 51.00 08/19/2022   LDLDIRECT 113.0 05/20/2016   LDLCALC 108 (H) 08/19/2022   ALT 23 01/15/2023   AST 18 01/15/2023   NA 140 01/15/2023   K 4.2 01/15/2023   CL 103 01/15/2023   CREATININE 0.77 01/15/2023   BUN 15 01/15/2023   CO2 22 01/15/2023   TSH 4.64 12/22/2022   HGBA1C 5.4 09/01/2019    No results found.  Assessment & Plan:  .Hyperlipidemia LDL goal <130 -     Lipid panel; Future -     LDL cholesterol, direct; Future -     Hemoglobin A1c; Future  Essential hypertension -     amLODIPine Besylate; TAKE 1 TABLET(5 MG) BY MOUTH AT BEDTIME  Dispense: 90 tablet; Refill: 1 -     Comprehensive metabolic panel; Future -     Microalbumin / creatinine urine ratio; Future  Hypothyroidism, postradioiodine therapy -     Levothyroxine Sodium; Take 1 tablet (112 mcg total) by mouth daily before breakfast.  Dispense: 90 tablet; Refill: 1  Long-term use of high-risk medication -     TSH; Future -     CBC with Differential/Platelet; Future  COVID-19 virus infection Assessment & Plan: She tested positive on       and was treated with Paxlovid .  Checking CMET today    Chronic diarrhea Assessment & Plan: Pancreatic elastase was ordered last September and never done (patient failed to submit a fecal sample)    Chronic cough  Esophageal dysphagia Assessment & Plan: Esophageal spasm without stricture was noted Nov 2022 DG esophagus,  normal EGD Jan 2023       I provided 30 minutes of face-to-face time during this encounter reviewing patient's last visit with me, patient's  most recent visit with cardiology,  nephrology,  and neurology,  recent surgical and non surgical procedures, previous  labs and imaging studies, counseling on currently addressed issues,  and post visit ordering to diagnostics and therapeutics .   Follow-up: No follow-ups on file.   Sherlene Shams, MD

## 2023-03-29 NOTE — Assessment & Plan Note (Signed)
Pancreatic elastase was ordered last September and never done (patient failed to submit a fecal sample)

## 2023-03-29 NOTE — Assessment & Plan Note (Signed)
Recurrent,  although IF ab was negative   IF   Ab  in Nov 2022  takes PPI

## 2023-03-29 NOTE — Assessment & Plan Note (Signed)
Esophageal spasm without stricture was noted Nov 2022 DG esophagus,  normal EGD Jan 2023

## 2023-03-29 NOTE — Patient Instructions (Addendum)
Time to start exercising !   30 minutes daily is your goal.   The ShingRx vaccine is now available  for free at your pharmacy and ADVISED for all interested adults over 50 to prevent shingles.   August .    Return for fasting labs in  August  Return to see me for CPE in November

## 2023-03-29 NOTE — Assessment & Plan Note (Signed)
She tested positive on       and was treated with Paxlovid .  Checking CMET today

## 2023-04-12 ENCOUNTER — Ambulatory Visit (INDEPENDENT_AMBULATORY_CARE_PROVIDER_SITE_OTHER): Payer: Medicare Other

## 2023-04-12 DIAGNOSIS — E538 Deficiency of other specified B group vitamins: Secondary | ICD-10-CM

## 2023-04-12 MED ORDER — CYANOCOBALAMIN 1000 MCG/ML IJ SOLN
1000.0000 ug | Freq: Once | INTRAMUSCULAR | Status: AC
Start: 2023-04-12 — End: 2023-04-12
  Administered 2023-04-12: 1000 ug via INTRAMUSCULAR

## 2023-04-12 NOTE — Progress Notes (Signed)
Pt presented for their vitamin B12 injection. Pt was identified through two identifiers. Pt tolerated shot well in their left  deltoid.  

## 2023-04-16 ENCOUNTER — Other Ambulatory Visit: Payer: Self-pay | Admitting: Internal Medicine

## 2023-04-20 ENCOUNTER — Other Ambulatory Visit: Payer: Self-pay | Admitting: Internal Medicine

## 2023-05-13 DIAGNOSIS — D485 Neoplasm of uncertain behavior of skin: Secondary | ICD-10-CM | POA: Diagnosis not present

## 2023-05-13 DIAGNOSIS — L820 Inflamed seborrheic keratosis: Secondary | ICD-10-CM | POA: Diagnosis not present

## 2023-05-13 DIAGNOSIS — L738 Other specified follicular disorders: Secondary | ICD-10-CM | POA: Diagnosis not present

## 2023-05-17 ENCOUNTER — Ambulatory Visit (INDEPENDENT_AMBULATORY_CARE_PROVIDER_SITE_OTHER): Payer: Medicare Other

## 2023-05-17 DIAGNOSIS — E538 Deficiency of other specified B group vitamins: Secondary | ICD-10-CM | POA: Diagnosis not present

## 2023-05-17 MED ORDER — CYANOCOBALAMIN 1000 MCG/ML IJ SOLN
1000.0000 ug | Freq: Once | INTRAMUSCULAR | Status: AC
Start: 2023-05-17 — End: 2023-05-17
  Administered 2023-05-17: 1000 ug via INTRAMUSCULAR

## 2023-05-17 NOTE — Progress Notes (Signed)
After obtaining consent, and per orders of Dr. Tullo, injection of B-12 given IM in right deltoid by Analleli Gierke Lynn. Patient tolerated injection well.  

## 2023-06-14 ENCOUNTER — Telehealth: Payer: Self-pay | Admitting: Internal Medicine

## 2023-06-14 DIAGNOSIS — R350 Frequency of micturition: Secondary | ICD-10-CM

## 2023-06-14 NOTE — Telephone Encounter (Signed)
Spoke with pt and she stated that she is having burning with urination and frequency. I have ordered labs and pt has been scheduled for both a lab appt and a video visit with Dr. Darrick Huntsman.

## 2023-06-14 NOTE — Telephone Encounter (Signed)
Pt called wanting to drop off a specimen tomorrow at 2 because she think she has a UTI

## 2023-06-15 ENCOUNTER — Other Ambulatory Visit: Payer: BC Managed Care – PPO

## 2023-06-15 ENCOUNTER — Other Ambulatory Visit: Payer: Self-pay | Admitting: Internal Medicine

## 2023-06-15 DIAGNOSIS — R350 Frequency of micturition: Secondary | ICD-10-CM | POA: Diagnosis not present

## 2023-06-15 DIAGNOSIS — I10 Essential (primary) hypertension: Secondary | ICD-10-CM | POA: Diagnosis not present

## 2023-06-16 ENCOUNTER — Encounter: Payer: Self-pay | Admitting: Internal Medicine

## 2023-06-16 ENCOUNTER — Telehealth: Payer: BC Managed Care – PPO | Admitting: Internal Medicine

## 2023-06-16 VITALS — Ht 66.0 in | Wt 231.4 lb

## 2023-06-16 DIAGNOSIS — N3090 Cystitis, unspecified without hematuria: Secondary | ICD-10-CM | POA: Diagnosis not present

## 2023-06-16 LAB — URINALYSIS, ROUTINE W REFLEX MICROSCOPIC
Bilirubin Urine: NEGATIVE
Nitrite: POSITIVE — AB
Specific Gravity, Urine: 1.015 (ref 1.000–1.030)
Total Protein, Urine: NEGATIVE
Urine Glucose: NEGATIVE
Urobilinogen, UA: 2 — AB (ref 0.0–1.0)
pH: 7 (ref 5.0–8.0)

## 2023-06-16 LAB — MICROALBUMIN / CREATININE URINE RATIO
Creatinine,U: 111.7 mg/dL
Microalb Creat Ratio: 4.9 mg/g (ref 0.0–30.0)
Microalb, Ur: 5.5 mg/dL — ABNORMAL HIGH (ref 0.0–1.9)

## 2023-06-16 MED ORDER — CITALOPRAM HYDROBROMIDE 20 MG PO TABS: 20.0000 mg | ORAL_TABLET | Freq: Every day | ORAL | 1 refills | Status: DC

## 2023-06-16 MED ORDER — CIPROFLOXACIN HCL 250 MG PO TABS: 250.0000 mg | ORAL_TABLET | Freq: Two times a day (BID) | ORAL | 0 refills | Status: AC

## 2023-06-16 NOTE — Progress Notes (Signed)
Virtual Visit via Caregility   Note   This format is felt to be most appropriate for this patient at this time.  All issues noted in this document were discussed and addressed.  No physical exam was performed (except for noted visual exam findings with Video Visits).   I connected with Julie Perez on 06/16/23 at  4:30 PM EDT by a video enabled telemedicine application  and verified that I am speaking with the correct person using two identifiers. Location patient: home Location provider: work or home office Persons participating in the virtual visit: patient, provider  I discussed the limitations, risks, security and privacy concerns of performing an evaluation and management service by telephone and the availability of in person appointments. I also discussed with the patient that there may be a patient responsible charge related to this service. The patient expressed understanding and agreed to proceed.   Reason for visit: DYSURIA   HPI:  3 WEEK HISTORY of intermittent dysuria .  Symptoms would improve,  resolve then recur.  No nausea,  back pain, or fevers.  Drinks 1 gallon of water daily    ROS: See pertinent positives and negatives per HPI.  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   Echo June 2019   S/P hysterectomy 09/12/2014   Due to fibroids,    Thyroid disease     Past Surgical History:  Procedure Laterality Date   ABDOMINAL HYSTERECTOMY  1979   COLONOSCOPY WITH PROPOFOL N/A 12/10/2021   Procedure: COLONOSCOPY WITH PROPOFOL;  Surgeon: Toney Reil, MD;  Location: [facility] ENDOSCOPY;  Service: Gastroenterology;  Laterality: N/A;   ESOPHAGOGASTRODUODENOSCOPY N/A 12/10/2021   Procedure:  ESOPHAGOGASTRODUODENOSCOPY (EGD);  Surgeon: Toney Reil, MD;  Location: [facility] ENDOSCOPY;  Service: Gastroenterology;  Laterality: N/A;   SHOULDER ARTHROSCOPY WITH ROTATOR CUFF REPAIR Left 09/14/2019   Procedure: SHOULDER ARTHROSCOPY WITH ROTATOR CUFF REPAIR;  Surgeon: Lyndle Herrlich, MD;  Location: [facility];  Service: Orthopedics;  Laterality: Left;   VAGINAL HYSTERECTOMY  1979    Family History  Problem Relation Age of Onset   Hypertension Mother    Alzheimer's disease Mother    Cancer Father 41       prostate cancer   Cancer Sister 42       breast cancer   Breast cancer Sister 11   Aneurysm Sister    Diabetes Brother        type 2   Heart disease Sister        cabg x 4, tobacco abuse   Diabetes Sister        type 2   Lung cancer Nephew     SOCIAL HX:  reports that she has never smoked. She has never used smokeless tobacco. She reports current alcohol use of about 1.0 standard drink of alcohol per week. She reports that she does not use drugs.    Current Outpatient Medications:    amLODipine (NORVASC) 5 MG tablet, TAKE 1 TABLET(5 MG) BY MOUTH AT BEDTIME, Disp: 90 tablet, Rfl: 1   atorvastatin (LIPITOR) 40 MG tablet, TAKE 1 TABLET(40 MG) BY MOUTH AT BEDTIME, Disp: 90 tablet,  Rfl: 3   ciprofloxacin (CIPRO) 250 MG tablet, Take 1 tablet (250 mg total) by mouth 2 (two) times daily for 5 days., Disp: 10 tablet, Rfl: 0   clobetasol cream (TEMOVATE) 0.05 %, Apply 1 Application topically 2 (two) times daily. To irritated skin, Disp: 30 g, Rfl: 0   hyoscyamine (ANASPAZ) 0.125 MG TBDP disintergrating tablet, Place 1 tablet (0.125 mg total) under the tongue every 6 (six) hours as needed., Disp: 30 tablet, Rfl: 0   levothyroxine (SYNTHROID) 112 MCG tablet, Take 1 tablet (112 mcg total) by mouth daily before breakfast., Disp: 90 tablet, Rfl: 1   meloxicam (MOBIC) 15 MG tablet, TAKE 1 TABLET(15 MG) BY MOUTH DAILY, Disp: 30 tablet, Rfl: 0   omeprazole (PRILOSEC) 20 MG capsule,  TAKE 1 CAPSULE(20 MG) BY MOUTH DAILY, Disp: 90 capsule, Rfl: 2   solifenacin (VESICARE) 5 MG tablet, Take 1 tablet (5 mg total) by mouth daily., Disp: 90 tablet, Rfl: 2   citalopram (CELEXA) 20 MG tablet, Take 1 tablet (20 mg total) by mouth daily., Disp: 90 tablet, Rfl: 1  EXAM:  VITALS per patient if applicable:  GENERAL: alert, oriented, appears well and in no acute distress  HEENT: atraumatic, conjunttiva clear, no obvious abnormalities on inspection of external nose and ears  NECK: normal movements of the head and neck  LUNGS: on inspection no signs of respiratory distress, breathing rate appears normal, no obvious gross SOB, gasping or wheezing  CV: no obvious cyanosis  MS: moves all visible extremities without noticeable abnormality  PSYCH/NEURO: pleasant and cooperative, no obvious depression or anxiety, speech and thought processing grossly intact  ASSESSMENT AND PLAN: Cystitis Assessment & Plan: Prescribing empiric cipro for 5 days , given 3 week history f intermittent symptoms and abnormal UA w/micro.  Culture pending .  She is aware that the abx may need to be changed. Daily use of Probiotics for  3 weeks advised to reduce risk of C dificile colitis.     Other orders -     Citalopram Hydrobromide; Take 1 tablet (20 mg total) by mouth daily.  Dispense: 90 tablet; Refill: 1 -     Ciprofloxacin HCl; Take 1 tablet (250 mg total) by mouth 2 (two) times daily for 5 days.  Dispense: 10 tablet; Refill: 0      I discussed the assessment and treatment plan with the patient. The patient was provided an opportunity to ask questions and all were answered. The patient agreed with the plan and demonstrated an understanding of the instructions.   The patient was advised to call back or seek an in-person evaluation if the symptoms worsen or if the condition fails to improve as anticipated.   I spent 30 minutes dedicated to the care of this patient on the date of this encounter to  include pre-visit review of his medical history,  Face-to-face time with the patient , and post visit ordering of testing and therapeutics.    Sherlene Shams, MD

## 2023-06-16 NOTE — Assessment & Plan Note (Signed)
Prescribing empiric cipro for 5 days , given 3 week history f intermittent symptoms and abnormal UA w/micro.  Culture pending .  She is aware that the abx may need to be changed. Daily use of Probiotics for  3 weeks advised to reduce risk of C dificile colitis.

## 2023-06-17 LAB — URINE CULTURE: MICRO NUMBER:: 15235460

## 2023-06-21 ENCOUNTER — Ambulatory Visit (INDEPENDENT_AMBULATORY_CARE_PROVIDER_SITE_OTHER): Payer: BC Managed Care – PPO

## 2023-06-21 DIAGNOSIS — E538 Deficiency of other specified B group vitamins: Secondary | ICD-10-CM

## 2023-06-21 MED ORDER — CYANOCOBALAMIN 1000 MCG/ML IJ SOLN
1000.0000 ug | Freq: Once | INTRAMUSCULAR | Status: AC
Start: 2023-06-21 — End: 2023-06-21
  Administered 2023-06-21: 1000 ug via INTRAMUSCULAR

## 2023-06-21 NOTE — Progress Notes (Addendum)
Patient arrived for a B12 injection and it was administered into her left deltoid. Patient tolerated the injection well and did not show any signs of distress or voice any concerns. 

## 2023-06-29 ENCOUNTER — Other Ambulatory Visit (INDEPENDENT_AMBULATORY_CARE_PROVIDER_SITE_OTHER): Payer: BC Managed Care – PPO

## 2023-06-29 DIAGNOSIS — E785 Hyperlipidemia, unspecified: Secondary | ICD-10-CM | POA: Diagnosis not present

## 2023-06-29 DIAGNOSIS — I10 Essential (primary) hypertension: Secondary | ICD-10-CM | POA: Diagnosis not present

## 2023-06-29 DIAGNOSIS — Z79899 Other long term (current) drug therapy: Secondary | ICD-10-CM

## 2023-06-29 DIAGNOSIS — E890 Postprocedural hypothyroidism: Secondary | ICD-10-CM

## 2023-06-29 LAB — CBC WITH DIFFERENTIAL/PLATELET
Basophils Absolute: 0 10*3/uL (ref 0.0–0.1)
Basophils Relative: 0.6 % (ref 0.0–3.0)
Eosinophils Absolute: 0.1 10*3/uL (ref 0.0–0.7)
Eosinophils Relative: 1.9 % (ref 0.0–5.0)
HCT: 40.9 % (ref 36.0–46.0)
Hemoglobin: 13.5 g/dL (ref 12.0–15.0)
Lymphocytes Relative: 23.2 % (ref 12.0–46.0)
Lymphs Abs: 1.7 10*3/uL (ref 0.7–4.0)
MCHC: 33 g/dL (ref 30.0–36.0)
MCV: 90.3 fl (ref 78.0–100.0)
Monocytes Absolute: 0.6 10*3/uL (ref 0.1–1.0)
Monocytes Relative: 8.3 % (ref 3.0–12.0)
Neutro Abs: 4.8 10*3/uL (ref 1.4–7.7)
Neutrophils Relative %: 66 % (ref 43.0–77.0)
Platelets: 246 10*3/uL (ref 150.0–400.0)
RBC: 4.53 Mil/uL (ref 3.87–5.11)
RDW: 14.4 % (ref 11.5–15.5)
WBC: 7.3 10*3/uL (ref 4.0–10.5)

## 2023-06-29 LAB — COMPREHENSIVE METABOLIC PANEL
ALT: 18 U/L (ref 0–35)
AST: 17 U/L (ref 0–37)
Albumin: 4.3 g/dL (ref 3.5–5.2)
Alkaline Phosphatase: 61 U/L (ref 39–117)
BUN: 15 mg/dL (ref 6–23)
CO2: 28 mEq/L (ref 19–32)
Calcium: 9.4 mg/dL (ref 8.4–10.5)
Chloride: 102 mEq/L (ref 96–112)
Creatinine, Ser: 0.79 mg/dL (ref 0.40–1.20)
GFR: 72.84 mL/min (ref >60.00)
Glucose, Bld: 101 mg/dL — ABNORMAL HIGH (ref 70–99)
Potassium: 4.3 mEq/L (ref 3.5–5.1)
Sodium: 140 mEq/L (ref 135–145)
Total Bilirubin: 0.8 mg/dL (ref 0.2–1.2)
Total Protein: 7.1 g/dL (ref 6.0–8.3)

## 2023-06-29 LAB — LDL CHOLESTEROL, DIRECT: Direct LDL: 121 mg/dL

## 2023-06-29 LAB — HEMOGLOBIN A1C: Hgb A1c MFr Bld: 5.9 % (ref 4.6–6.5)

## 2023-06-29 LAB — LIPID PANEL
Cholesterol: 185 mg/dL (ref 0–200)
HDL: 47.6 mg/dL (ref >39.00)
LDL Cholesterol: 101 mg/dL — ABNORMAL HIGH (ref 0–99)
NonHDL: 137.32
Total CHOL/HDL Ratio: 4
Triglycerides: 182 mg/dL — ABNORMAL HIGH (ref 0.0–149.0)
VLDL: 36.4 mg/dL (ref 0.0–40.0)

## 2023-06-29 LAB — TSH: TSH: 8.75 u[IU]/mL — ABNORMAL HIGH (ref 0.35–5.50)

## 2023-06-30 ENCOUNTER — Ambulatory Visit (INDEPENDENT_AMBULATORY_CARE_PROVIDER_SITE_OTHER): Payer: BC Managed Care – PPO | Admitting: *Deleted

## 2023-06-30 VITALS — Ht 66.0 in | Wt 232.0 lb

## 2023-06-30 DIAGNOSIS — Z0000 Encounter for general adult medical examination without abnormal findings: Secondary | ICD-10-CM | POA: Diagnosis not present

## 2023-06-30 NOTE — Patient Instructions (Signed)
Julie Perez , Thank you for taking time to come for your Medicare Wellness Visit. I appreciate your ongoing commitment to your health goals. Please review the following plan we discussed and let me know if I can assist you in the future.   Referrals/Orders/Follow-Ups/Clinician Recommendations: None  This is a list of the screening recommended for you and due dates:  Health Maintenance  Topic Date Due   Zoster (Shingles) Vaccine (1 of 2) 06/06/1965   COVID-19 Vaccine (4 - 2023-24 season) 07/24/2022   Flu Shot  06/24/2023   Mammogram  12/03/2023   Medicare Annual Wellness Visit  06/29/2024   DTaP/Tdap/Td vaccine (2 - Td or Tdap) 05/20/2026   Colon Cancer Screening  12/10/2026   Pneumonia Vaccine  Completed   DEXA scan (bone density measurement)  Completed   Hepatitis C Screening  Completed   HPV Vaccine  Aged Out    Advanced directives: (Declined) Advance directive discussed with you today. Even though you declined this today, please call our office should you change your mind, and we can give you the proper paperwork for you to fill out. Will pick up paperwork at next visit  Next Medicare Annual Wellness Visit scheduled for next year: Yes 07/03/24 @ 9:45  Preventive Care 65 Years and Older, Female Preventive care refers to lifestyle choices and visits with your health care provider that can promote health and wellness. What does preventive care include? A yearly physical exam. This is also called an annual well check. Dental exams once or twice a year. Routine eye exams. Ask your health care provider how often you should have your eyes checked. Personal lifestyle choices, including: Daily care of your teeth and gums. Regular physical activity. Eating a healthy diet. Avoiding tobacco and drug use. Limiting alcohol use. Practicing safe sex. Taking low-dose aspirin every day. Taking vitamin and mineral supplements as recommended by your health care provider. What happens during an  annual well check? The services and screenings done by your health care provider during your annual well check will depend on your age, overall health, lifestyle risk factors, and family history of disease. Counseling  Your health care provider may ask you questions about your: Alcohol use. Tobacco use. Drug use. Emotional well-being. Home and relationship well-being. Sexual activity. Eating habits. History of falls. Memory and ability to understand (cognition). Work and work Astronomer. Reproductive health. Screening  You may have the following tests or measurements: Height, weight, and BMI. Blood pressure. Lipid and cholesterol levels. These may be checked every 5 years, or more frequently if you are over 80 years old. Skin check. Lung cancer screening. You may have this screening every year starting at age 66 if you have a 30-pack-year history of smoking and currently smoke or have quit within the past 15 years. Fecal occult blood test (FOBT) of the stool. You may have this test every year starting at age 23. Flexible sigmoidoscopy or colonoscopy. You may have a sigmoidoscopy every 5 years or a colonoscopy every 10 years starting at age 45. Hepatitis C blood test. Hepatitis B blood test. Sexually transmitted disease (STD) testing. Diabetes screening. This is done by checking your blood sugar (glucose) after you have not eaten for a while (fasting). You may have this done every 1-3 years. Bone density scan. This is done to screen for osteoporosis. You may have this done starting at age 53. Mammogram. This may be done every 1-2 years. Talk to your health care provider about how often you should have  regular mammograms. Talk with your health care provider about your test results, treatment options, and if necessary, the need for more tests. Vaccines  Your health care provider may recommend certain vaccines, such as: Influenza vaccine. This is recommended every year. Tetanus,  diphtheria, and acellular pertussis (Tdap, Td) vaccine. You may need a Td booster every 10 years. Zoster vaccine. You may need this after age 29. Pneumococcal 13-valent conjugate (PCV13) vaccine. One dose is recommended after age 78. Pneumococcal polysaccharide (PPSV23) vaccine. One dose is recommended after age 37. Talk to your health care provider about which screenings and vaccines you need and how often you need them. This information is not intended to replace advice given to you by your health care provider. Make sure you discuss any questions you have with your health care provider. Document Released: 12/06/2015 Document Revised: 07/29/2016 Document Reviewed: 09/10/2015 Elsevier Interactive Patient Education  2017 [employer].  Fall Prevention in the Home Falls can cause injuries. They can happen to people of all ages. There are many things you can do to make your home safe and to help prevent falls. What can I do on the outside of my home? Regularly fix the edges of walkways and driveways and fix any cracks. Remove anything that might make you trip as you walk through a door, such as a raised step or threshold. Trim any bushes or trees on the path to your home. Use bright outdoor lighting. Clear any walking paths of anything that might make someone trip, such as rocks or tools. Regularly check to see if handrails are loose or broken. Make sure that both sides of any steps have handrails. Any raised decks and porches should have guardrails on the edges. Have any leaves, snow, or ice cleared regularly. Use sand or salt on walking paths during winter. Clean up any spills in your garage right away. This includes oil or grease spills. What can I do in the bathroom? Use night lights. Install grab bars by the toilet and in the tub and shower. Do not use towel bars as grab bars. Use non-skid mats or decals in the tub or shower. If you need to sit down in the shower, use a plastic,  non-slip stool. Keep the floor dry. Clean up any water that spills on the floor as soon as it happens. Remove soap buildup in the tub or shower regularly. Attach bath mats securely with double-sided non-slip rug tape. Do not have throw rugs and other things on the floor that can make you trip. What can I do in the bedroom? Use night lights. Make sure that you have a light by your bed that is easy to reach. Do not use any sheets or blankets that are too big for your bed. They should not hang down onto the floor. Have a firm chair that has side arms. You can use this for support while you get dressed. Do not have throw rugs and other things on the floor that can make you trip. What can I do in the kitchen? Clean up any spills right away. Avoid walking on wet floors. Keep items that you use a lot in easy-to-reach places. If you need to reach something above you, use a strong step stool that has a grab bar. Keep electrical cords out of the way. Do not use floor polish or wax that makes floors slippery. If you must use wax, use non-skid floor wax. Do not have throw rugs and other things on the floor that  can make you trip. What can I do with my stairs? Do not leave any items on the stairs. Make sure that there are handrails on both sides of the stairs and use them. Fix handrails that are broken or loose. Make sure that handrails are as long as the stairways. Check any carpeting to make sure that it is firmly attached to the stairs. Fix any carpet that is loose or worn. Avoid having throw rugs at the top or bottom of the stairs. If you do have throw rugs, attach them to the floor with carpet tape. Make sure that you have a light switch at the top of the stairs and the bottom of the stairs. If you do not have them, ask someone to add them for you. What else can I do to help prevent falls? Wear shoes that: Do not have high heels. Have rubber bottoms. Are comfortable and fit you well. Are closed  at the toe. Do not wear sandals. If you use a stepladder: Make sure that it is fully opened. Do not climb a closed stepladder. Make sure that both sides of the stepladder are locked into place. Ask someone to hold it for you, if possible. Clearly mark and make sure that you can see: Any grab bars or handrails. First and last steps. Where the edge of each step is. Use tools that help you move around (mobility aids) if they are needed. These include: Canes. Walkers. Scooters. Crutches. Turn on the lights when you go into a dark area. Replace any light bulbs as soon as they burn out. Set up your furniture so you have a clear path. Avoid moving your furniture around. If any of your floors are uneven, fix them. If there are any pets around you, be aware of where they are. Review your medicines with your doctor. Some medicines can make you feel dizzy. This can increase your chance of falling. Ask your doctor what other things that you can do to help prevent falls. This information is not intended to replace advice given to you by your health care provider. Make sure you discuss any questions you have with your health care provider. Document Released: 09/05/2009 Document Revised: 04/16/2016 Document Reviewed: 12/14/2014 Elsevier Interactive Patient Education  2017 [employer].

## 2023-06-30 NOTE — Progress Notes (Addendum)
Subjective:   Julie Perez is a 77 y.o. female who presents for Medicare Annual (Subsequent) preventive examination.  Visit Complete: Virtual  I connected with  Julie Perez on 06/30/23 by a audio enabled telemedicine application and verified that I am speaking with the correct person using two identifiers.  Patient Location: Home  Provider Location: Office/Clinic  I discussed the limitations of evaluation and management by telemedicine. The patient expressed understanding and agreed to proceed.  Vital Signs: Unable to obtain new vitals due to this being a telehealth visit.   Review of Systems     Cardiac Risk Factors include: advanced age (>86men, >41 women);hypertension;dyslipidemia;Other (see comment), Risk factor comments: Cardiomeagaly     Objective:    Today's Vitals   06/30/23 1332  Weight: 232 lb (105.2 kg)  Height: 5\' 6"  (1.676 m)   Body mass index is 37.45 kg/m.     06/30/2023    1:46 PM 07/12/2022    1:27 PM 04/03/2022    9:14 AM 12/10/2021    7:31 AM 04/16/2020   11:14 AM 09/14/2019   11:12 AM 08/23/2018    2:05 PM  Advanced Directives  Does Patient Have a Medical Advance Directive? Yes No Yes Yes Yes Yes No  Type of Estate agent of [place];Living will  Healthcare Power of [place];Living will Healthcare Power of [employer] Power of [employer] Power of [place];Living will   Does patient want to make changes to medical advance directive?   No - Patient declined   No - Patient declined   Copy of Healthcare Power of Attorney in Chart? No - copy requested  No - copy requested  Yes - validated most recent copy scanned in chart (See row information) No - copy requested   Would patient like information on creating a medical advance directive?       Yes (MAU/Ambulatory/Procedural Areas - Information given)    Current Medications (verified) Outpatient Encounter Medications as of 06/30/2023  Medication Sig   amLODipine  (NORVASC) 5 MG tablet TAKE 1 TABLET(5 MG) BY MOUTH AT BEDTIME   atorvastatin (LIPITOR) 40 MG tablet TAKE 1 TABLET(40 MG) BY MOUTH AT BEDTIME   citalopram (CELEXA) 20 MG tablet Take 1 tablet (20 mg total) by mouth daily.   clobetasol cream (TEMOVATE) 0.05 % Apply 1 Application topically 2 (two) times daily. To irritated skin   hyoscyamine (ANASPAZ) 0.125 MG TBDP disintergrating tablet Place 1 tablet (0.125 mg total) under the tongue every 6 (six) hours as needed.   levothyroxine (SYNTHROID) 112 MCG tablet Take 1 tablet (112 mcg total) by mouth daily before breakfast.   meloxicam (MOBIC) 15 MG tablet TAKE 1 TABLET(15 MG) BY MOUTH DAILY   omeprazole (PRILOSEC) 20 MG capsule TAKE 1 CAPSULE(20 MG) BY MOUTH DAILY   solifenacin (VESICARE) 5 MG tablet Take 1 tablet (5 mg total) by mouth daily.   No facility-administered encounter medications on file as of 06/30/2023.    Allergies (verified) Patient has no known allergies.   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 ENDOSCOPY;  Service: Gastroenterology;  Laterality: N/A;   ESOPHAGOGASTRODUODENOSCOPY N/A 12/10/2021   Procedure: ESOPHAGOGASTRODUODENOSCOPY (EGD);  Surgeon: Toney Reil, MD;  Location: [facility] ENDOSCOPY;  Service: Gastroenterology;  Laterality: N/A;   SHOULDER ARTHROSCOPY WITH ROTATOR CUFF REPAIR Left 09/14/2019   Procedure: SHOULDER  ARTHROSCOPY WITH ROTATOR CUFF REPAIR;  Surgeon: Lyndle Herrlich, MD;  Location: [facility];  Service: Orthopedics;  Laterality: Left;   VAGINAL HYSTERECTOMY  1979   Family History  Problem Relation Age of Onset   Hypertension Mother    Alzheimer's disease Mother    Cancer Father 50       prostate cancer   Cancer Sister 62       breast cancer   Breast cancer Sister 34   Aneurysm Sister    Diabetes Brother        type 2   Heart disease Sister        cabg x 4, tobacco abuse   Diabetes Sister        type 2   Lung cancer Nephew    Social History   Socioeconomic History   Marital status: Married    Spouse name: Alysia Penna   Number of children: 2   Years of education: Not on file   Highest education level: 12th grade  Occupational History   Occupation: Retired  Tobacco Use   Smoking status: Never   Smokeless tobacco: Never   Tobacco comments:    smoking cessation materials not required  Vaping Use   Vaping status: Never Used  Substance and Sexual Activity   Alcohol use: Yes    Alcohol/week: 1.0 standard drink of alcohol    Types: 1 Glasses of wine per week    Comment: rare   Drug use: No   Sexual activity: Not Currently  Other Topics Concern   Not on file  Social History Narrative   Pt lives alone. Husband lives out of state.   Social Determinants of Health   Financial Resource Strain: Low Risk  (06/30/2023)   Overall Financial Resource Strain (CARDIA)    Difficulty of Paying Living Expenses: Not hard at all  Food Insecurity: No Food Insecurity (06/30/2023)   Hunger Vital Sign    Worried About Running Out of Food in the Last Year: Never true    Ran Out of Food in the Last Year: Never true  Transportation Needs: No Transportation Needs (06/30/2023)   PRAPARE - Administrator, Civil Service (Medical): No    Lack of Transportation (Non-Medical): No  Physical Activity: Inactive (06/30/2023)   Exercise Vital Sign    Days of Exercise per Week: 0 days     Minutes of Exercise per Session: 0 min  Stress: No Stress Concern Present (06/30/2023)   [employer] of Occupational Health - Occupational Stress Questionnaire    Feeling of Stress : Not at all  Social Connections: Socially Isolated (06/30/2023)   Social Connection and Isolation Panel [NHANES]    Frequency of Communication with Friends and Family: More than three times a week    Frequency of Social Gatherings with Friends and Family: More than three times a week    Attends Religious Services: Never    Database administrator or  Organizations: No    Attends Banker Meetings: Never    Marital Status: Separated    Tobacco Counseling Counseling given: Not Answered Tobacco comments: smoking cessation materials not required   Clinical Intake:  Pre-visit preparation completed: Yes  Pain : No/denies pain     BMI - recorded: 37.45 Nutritional Status: BMI > 30  Obese Nutritional Risks: None, Nausea/ vomitting/ diarrhea (nausea off and on for a while) Diabetes: No  How often do you need to have someone help you when you read instructions, pamphlets, or other written materials from your doctor or pharmacy?: 1 - Never  Interpreter Needed?: No  Information entered by :: R. Natalyn Szymanowski LPN   Activities of Daily Living    06/30/2023    1:35 PM 06/30/2023   10:53 AM  In your present state of health, do you have any difficulty performing the following activities:  Hearing? 0 0  Vision? 0 0  Comment contact and readers   Difficulty concentrating or making decisions? 1 0  Comment remembering things   Walking or climbing stairs? 0 0  Dressing or bathing? 0 0  Doing errands, shopping? 0 0  Preparing Food and eating ? N N  Using the Toilet? N N  In the past six months, have you accidently leaked urine? Malvin Johns  Comment wears a pad   Do you have problems with loss of bowel control? Y Y  Managing your Medications? N N  Managing your Finances? N N  Housekeeping or managing your  Housekeeping? N N    Patient Care Team: Sherlene Shams, MD as PCP - General (Internal Medicine) Dedra Skeens, PA-C as Consulting Physician (Orthopedic Surgery)  Indicate any recent Medical Services you may have received from other than [facility] providers in the past year (date may be approximate).     Assessment:   This is a routine wellness examination for [facility].  Hearing/Vision screen Hearing Screening - Comments:: No issues Vision Screening - Comments:: Contact and readers  Dietary issues and exercise activities discussed:     Goals Addressed             This Visit's Progress    Patient Stated       Start exercising and lose weight       Depression Screen    06/30/2023    1:40 PM 06/16/2023    4:25 PM 03/29/2023    1:02 PM 12/22/2022    3:14 PM 04/03/2022    9:12 AM 09/18/2021   11:06 AM 08/18/2021   11:00 AM  PHQ 2/9 Scores  PHQ - 2 Score 0 0 0 0 0 1 2  PHQ- 9 Score 9          Fall Risk    06/30/2023    1:37 PM 06/30/2023   10:53 AM 06/16/2023    4:25 PM 03/29/2023   12:55 PM 12/22/2022    3:14 PM  Fall Risk   Falls in the past year? 0 0 0 0 0  Number falls in past yr: 0 0 0 0 0  Injury with Fall? 0 0 0 0 0  Risk for fall due to : No Fall Risks  No Fall Risks No Fall Risks No Fall Risks  Follow up Falls prevention discussed;Falls evaluation completed  Falls evaluation completed Falls evaluation completed Falls evaluation completed    MEDICARE RISK AT HOME:  Medicare Risk at Home - 06/30/23 1337     Any stairs in or around the home?  Yes    If so, are there any without handrails? No    Home free of loose throw rugs in walkways, pet beds, electrical cords, etc? Yes    Adequate lighting in your home to reduce risk of falls? Yes    Life alert? No    Use of a cane, walker or w/c? No    Grab bars in the bathroom? No    Shower chair or bench in shower? No    Elevated toilet seat or a handicapped toilet? No              Cognitive Function:    05/20/2017     2:43 PM  MMSE - Mini Mental State Exam  Orientation to time 5  Orientation to Place 5  Registration 3  Attention/ Calculation 5  Recall 3  Language- name 2 objects 2  Language- repeat 1  Language- follow 3 step command 3  Language- read & follow direction 1  Write a sentence 1  Copy design 1  Total score 30        06/30/2023    1:46 PM 04/16/2020   11:18 AM 08/23/2018    2:08 PM  6CIT Screen  What Year? 0 points 0 points 0 points  What month? 0 points 0 points 0 points  What time? 0 points 0 points 0 points  Count back from 20 0 points 0 points 0 points  Months in reverse 0 points 0 points 0 points  Repeat phrase 0 points 0 points 0 points  Total Score 0 points 0 points 0 points    Immunizations Immunization History  Administered Date(s) Administered   Fluad Quad(high Dose 65+) 09/18/2021, 08/19/2022   Influenza, High Dose Seasonal PF 09/08/2017, 08/19/2018, 07/27/2019   Influenza,inj,Quad PF,6+ Mos 09/12/2014, 07/17/2015   Influenza-Unspecified 08/23/2016, 08/23/2017, 08/23/2020   PFIZER(Purple Top)SARS-COV-2 Vaccination 01/11/2020, 01/31/2020, 08/23/2020   Pneumococcal Conjugate-13 05/20/2017   Pneumococcal Polysaccharide-23 07/10/2013, 07/12/2020   Tdap 05/20/2016   Zoster, Live 07/06/2014    TDAP status: Up to date  Flu Vaccine status: Up to date  Pneumococcal vaccine status: Up to date  Covid-19 vaccine status: Completed vaccines  Qualifies for Shingles Vaccine? Yes   Zostavax completed Yes   Shingrix Completed?: No.    Education has been provided regarding the importance of this vaccine. Patient has been advised to call insurance company to determine out of pocket expense if they have not yet received this vaccine. Advised may also receive vaccine at local pharmacy or Health Dept. Verbalized acceptance and understanding.  Screening Tests Health Maintenance  Topic Date Due   Zoster Vaccines- Shingrix (1 of 2) [birth year]06/1965   COVID-19 Vaccine (4 - 2023-24  season) 07/24/2022   Medicare Annual Wellness (AWV)  04/04/2023   INFLUENZA VACCINE  06/24/2023   MAMMOGRAM  12/03/2023   DTaP/Tdap/Td (2 - Td or Tdap) 05/20/2026   Colonoscopy  12/10/2026   Pneumonia Vaccine 34+ Years old  Completed   DEXA SCAN  Completed   Hepatitis C Screening  Completed   HPV VACCINES  Aged Out    Health Maintenance  Health Maintenance Due  Topic Date Due   Zoster Vaccines- Shingrix (1 of 2) 06[birth year]06/1965   COVID-19 Vaccine (4 - 2023-24 season) 07/24/2022   Medicare Annual Wellness (AWV)  04/04/2023   INFLUENZA VACCINE  06/24/2023    Colorectal cancer screening: Type of screening: Colonoscopy. Completed 1/23. Repeat every 3 years  Mammogram status: Completed 1/24. Repeat every year  Bone Density status: Completed 3/18.  Results reflect: Bone density results: NORMAL. Repeat every 2 years. Wants to discuss with PCP  Lung Cancer Screening: (Low Dose CT Chest recommended if Age 46-80 years, 20 pack-year currently smoking OR have quit w/in 15years.) does not qualify.   Additional Screening:  Hepatitis C Screening: does qualify; Completed 6/17  Vision Screening: Recommended annual ophthalmology exams for early detection of glaucoma and other disorders of the eye. Is the patient up to date with their annual eye exam?  Yes  Who is the provider or what is the name of the office in which the patient attends annual eye exams? [facility] If pt is not established with a provider, would they like to be referred to a provider to establish care? No .   Dental Screening: Recommended annual dental exams for proper oral hygiene    Community Resource Referral / Chronic Care Management: CRR required this visit?  No   CCM required this visit?  No     Plan:     I have personally reviewed and noted the following in the patient's chart:   Medical and social history Use of alcohol, tobacco or illicit drugs  Current medications and supplements including opioid  prescriptions. Patient is not currently taking opioid prescriptions. Functional ability and status Nutritional status Physical activity Advanced directives List of other physicians Hospitalizations, surgeries, and ER visits in previous 12 months Vitals Screenings to include cognitive, depression, and falls Referrals and appointments  In addition, I have reviewed and discussed with patient certain preventive protocols, quality metrics, and best practice recommendations. A written personalized care plan for preventive services as well as general preventive health recommendations were provided to patient.     Sydell Axon, LPN   11/28/1094   After Visit Summary: (MyChart) Due to this being a telephonic visit, the after visit summary with patients personalized plan was offered to patient via MyChart   Nurse Notes: None   I have reviewed the above information and agree with above.   Duncan Dull, MD

## 2023-07-01 ENCOUNTER — Other Ambulatory Visit: Payer: Self-pay | Admitting: Internal Medicine

## 2023-07-01 DIAGNOSIS — E890 Postprocedural hypothyroidism: Secondary | ICD-10-CM

## 2023-07-01 MED ORDER — LEVOTHYROXINE SODIUM 125 MCG PO TABS
125.0000 ug | ORAL_TABLET | Freq: Every day | ORAL | 1 refills | Status: DC
Start: 2023-07-01 — End: 2023-09-29

## 2023-07-01 NOTE — Assessment & Plan Note (Signed)
Thyroid underactive on 112 mcg dose.  Increased to 125  mcg; repeat TSH around 9/22  Lab Results  Component Value Date   TSH 8.75 (H) 06/29/2023

## 2023-07-08 ENCOUNTER — Encounter (INDEPENDENT_AMBULATORY_CARE_PROVIDER_SITE_OTHER): Payer: Self-pay

## 2023-07-27 ENCOUNTER — Ambulatory Visit (INDEPENDENT_AMBULATORY_CARE_PROVIDER_SITE_OTHER): Payer: BC Managed Care – PPO

## 2023-07-27 DIAGNOSIS — E538 Deficiency of other specified B group vitamins: Secondary | ICD-10-CM

## 2023-07-27 MED ORDER — CYANOCOBALAMIN 1000 MCG/ML IJ SOLN
1000.0000 ug | Freq: Once | INTRAMUSCULAR | Status: AC
Start: 2023-07-27 — End: 2023-07-27
  Administered 2023-07-27: 1000 ug via INTRAMUSCULAR

## 2023-07-27 NOTE — Progress Notes (Signed)
Patient presented for B 12 injection to right deltoid, patient voiced no concerns nor showed any signs of distress during injection. 

## 2023-07-29 ENCOUNTER — Ambulatory Visit: Payer: BC Managed Care – PPO | Admitting: Nurse Practitioner

## 2023-08-27 ENCOUNTER — Ambulatory Visit (INDEPENDENT_AMBULATORY_CARE_PROVIDER_SITE_OTHER): Payer: BC Managed Care – PPO | Admitting: *Deleted

## 2023-08-27 DIAGNOSIS — Z23 Encounter for immunization: Secondary | ICD-10-CM

## 2023-08-27 DIAGNOSIS — E538 Deficiency of other specified B group vitamins: Secondary | ICD-10-CM

## 2023-08-27 MED ORDER — CYANOCOBALAMIN 1000 MCG/ML IJ SOLN
1000.0000 ug | Freq: Once | INTRAMUSCULAR | Status: AC
Start: 2023-08-27 — End: 2023-08-27
  Administered 2023-08-27: 1000 ug via INTRAMUSCULAR

## 2023-08-27 NOTE — Progress Notes (Signed)
Pt received B12 injection in left deltoid muscle. Pt tolerated it well with no complaints or concerns.   Pt received high dose flu vaccine in right deltoid & tolerate it well with no complaints.

## 2023-09-13 ENCOUNTER — Other Ambulatory Visit: Payer: Self-pay | Admitting: Internal Medicine

## 2023-09-13 DIAGNOSIS — E890 Postprocedural hypothyroidism: Secondary | ICD-10-CM

## 2023-09-14 ENCOUNTER — Other Ambulatory Visit: Payer: Self-pay

## 2023-09-14 DIAGNOSIS — I10 Essential (primary) hypertension: Secondary | ICD-10-CM

## 2023-09-14 MED ORDER — AMLODIPINE BESYLATE 5 MG PO TABS: ORAL_TABLET | ORAL | 1 refills | Status: DC

## 2023-09-29 ENCOUNTER — Ambulatory Visit (INDEPENDENT_AMBULATORY_CARE_PROVIDER_SITE_OTHER): Payer: BC Managed Care – PPO | Admitting: Internal Medicine

## 2023-09-29 ENCOUNTER — Encounter: Payer: Self-pay | Admitting: Internal Medicine

## 2023-09-29 VITALS — BP 128/76 | HR 59 | Ht 66.0 in | Wt 228.6 lb

## 2023-09-29 DIAGNOSIS — E66811 Obesity, class 1: Secondary | ICD-10-CM | POA: Diagnosis not present

## 2023-09-29 DIAGNOSIS — K529 Noninfective gastroenteritis and colitis, unspecified: Secondary | ICD-10-CM

## 2023-09-29 DIAGNOSIS — E538 Deficiency of other specified B group vitamins: Secondary | ICD-10-CM

## 2023-09-29 DIAGNOSIS — E2839 Other primary ovarian failure: Secondary | ICD-10-CM | POA: Diagnosis not present

## 2023-09-29 DIAGNOSIS — E890 Postprocedural hypothyroidism: Secondary | ICD-10-CM | POA: Diagnosis not present

## 2023-09-29 DIAGNOSIS — Z0000 Encounter for general adult medical examination without abnormal findings: Secondary | ICD-10-CM

## 2023-09-29 DIAGNOSIS — K633 Ulcer of intestine: Secondary | ICD-10-CM | POA: Diagnosis not present

## 2023-09-29 DIAGNOSIS — I10 Essential (primary) hypertension: Secondary | ICD-10-CM

## 2023-09-29 LAB — TSH: TSH: 2.9 u[IU]/mL (ref 0.35–5.50)

## 2023-09-29 MED ORDER — LEVOTHYROXINE SODIUM 125 MCG PO TABS: 125.0000 ug | ORAL_TABLET | Freq: Every day | ORAL | 1 refills | Status: DC

## 2023-09-29 MED ORDER — CYANOCOBALAMIN 1000 MCG/ML IJ SOLN
1000.0000 ug | Freq: Once | INTRAMUSCULAR | Status: AC
  Administered 2023-09-29: 1000 ug via INTRAMUSCULAR

## 2023-09-29 MED ORDER — OMEPRAZOLE 20 MG PO CPDR: DELAYED_RELEASE_CAPSULE | ORAL | 2 refills | Status: DC

## 2023-09-29 NOTE — Assessment & Plan Note (Addendum)
Patient is taking her medications as prescribed and notes no adverse effects.  Home BP readings have been done about once per week and are  generally < 130/80  Her BP this mornign at home was 126/78.   She is avoiding added salt in her diet a  encouraged to start walking 3 times per week for exercise  .

## 2023-09-29 NOTE — Assessment & Plan Note (Signed)
Thyroid function is WNL on current dose.  No current changes needed.  

## 2023-09-29 NOTE — Addendum Note (Signed)
Addended by: Sherlene Shams on: 09/29/2023 09:19 PM   Modules accepted: Orders

## 2023-09-29 NOTE — Patient Instructions (Addendum)
  Your  DEXA scan  and your mammogram have been ordered.  You are encouraged (required) to call to schedule your own  appointment at [facility]  , and their phone number is [phone]    Supplement diet with Premier Protein or Fair life bc you are not getting enough protein currently   I want you to focus on losing weight this year. You can do it GRADUALLY,  by substituting one of the meals below every day for either lunch or dinner.    Healthy Choice "low carb power bowl"  entrees and  "Steamer" entrees are are great low carb entrees that microwave in 5 minutes  Walk for 30 minutes daily     ' Meloxican is AN NSAID and can cause gastritis.   You should not use it, advil or aleve on a regularly basis,  and you should take omeprazole daily  because you had an ulcer in your small intestinte.

## 2023-09-29 NOTE — Addendum Note (Signed)
Addended by: Sandy Salaam on: 09/29/2023 09:26 AM   Modules accepted: Orders

## 2023-09-29 NOTE — Assessment & Plan Note (Signed)
Checking IF ab today.  Continue monthly injections.  Not vegetarian,  history  of terminal ileum ulcer

## 2023-09-29 NOTE — Assessment & Plan Note (Signed)
4 lb seight loss is unintentional due to use of soft diet due to mouth pain following multiple upper teeth extraction

## 2023-09-29 NOTE — Assessment & Plan Note (Addendum)
Found in the terminal ileum during 2023 colonoscopy .  Biopsy taken, no malignant cells. CMG/HSV histologies negative.   Repeat colonoscopy in 5 yrs due to present of tubular adenomas.

## 2023-09-29 NOTE — Assessment & Plan Note (Addendum)
Etiology unclear despite EGD/colonoscopy, which were done Jan 2023.  Multiple biopsies taken; no evidence of celiac  disease microscopic colitis or Crohn's.  Pancreatic elastase was ordered Sept 2023 and never done (patient failed to submit a fecal sample) .

## 2023-09-29 NOTE — Progress Notes (Signed)
Patient ID: Julie Perez, female    DOB: 27-Apr-1946  Age: 77 y.o. MRN: [id number]  The patient is here for annual preventive  examination and management of other chronic and acute problems.   The risk factors are reflected in the social history.  The roster of all physicians providing medical care to patient - is listed in the Snapshot section of the chart.  Activities of daily living:  The patient is 100% independent in all ADLs: dressing, toileting, feeding as well as independent mobility  Home safety : The patient has smoke detectors in the home. They wear seatbelts.  There are no firearms at home. There is no violence in the home.   There is no risks for hepatitis, STDs or HIV. There is no   history of blood transfusion. They have no travel history to infectious disease endemic areas of the world.  The patient has seen their dentist in the last six month. They have seen their eye doctor in the last year. They admit to slight hearing difficulty with regard to whispered voices and some television programs.  They have deferred audiologic testing in the last year.  They do not  have excessive sun exposure. Discussed the need for sun protection: hats, long sleeves and use of sunscreen if there is significant sun exposure.   Diet: the importance of a healthy diet is discussed. She does not have a healthy diet and is obese.   The benefits of regular aerobic exercise were discussed. She walks 3 times per week ,  20 minutes.   Depression screen: there are no signs or vegative symptoms of depression- irritability, change in appetite, anhedonia, sadness/tearfullness.  Cognitive assessment: the patient manages all their financial and personal affairs and is actively engaged. They could relate day,date,year and events; recalled 2/3 objects at 3 minutes; performed clock-face test normally.  The following portions of the patient's history were reviewed and updated as appropriate: allergies, current  medications, past family history, past medical history,  past surgical history, past social history  and problem list. stil Visual acuity was not assessed per patient preference since she has regular follow up with her ophthalmologist. Hearing and body mass index were assessed and reviewed.   During the course of the visit the patient was educated and counseled about appropriate screening and preventive services including : fall prevention , diabetes screening, nutrition counseling, colorectal cancer screening, and recommended immunizations.    CC:   1) mouth pain : had upper teeth removed 3 weeks ago.  Still adjusting ,  has checkup next week.  Not eating solid  food yet due to pain .  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   She has a past surgical history that includes Abdominal hysterectomy (1979); Vaginal hysterectomy (1979); Shoulder arthroscopy with rotator cuff repair (Left, 09/14/2019); Colonoscopy with propofol (N/A, 12/10/2021); and Esophagogastroduodenoscopy (N/A, 12/10/2021).   Her family history includes Alzheimer's disease in her mother; Aneurysm in her sister; Arthritis in her mother; Breast cancer (age of onset: 82) in her sister; Cancer (age of onset: 26) in her father; Cancer (age of onset: 56) in her sister; Diabetes in her brother and sister; Heart disease in her sister; Hypertension in her mother; Lung cancer in her nephew.She reports that she has  never smoked. She has never used smokeless tobacco. She reports current alcohol use of about 1.0 standard drink of alcohol per week. She reports that she does not use drugs.  Outpatient  Medications Prior to Visit  Medication Sig Dispense Refill   amLODipine (NORVASC) 5 MG tablet TAKE 1 TABLET(5 MG) BY MOUTH AT BEDTIME 90 tablet 1   atorvastatin (LIPITOR) 40 MG tablet TAKE 1 TABLET(40 MG) BY MOUTH AT BEDTIME 90 tablet 3   citalopram (CELEXA) 20 MG tablet Take 1 tablet (20 mg total) by mouth daily. 90 tablet 1   clobetasol cream (TEMOVATE) 0.05 % Apply 1 Application topically 2 (two) times daily. To irritated skin 30 g 0   hyoscyamine (ANASPAZ) 0.125 MG TBDP disintergrating tablet Place 1 tablet (0.125 mg total) under the tongue every 6 (six) hours as needed. 30 tablet 0   levothyroxine (SYNTHROID) 125 MCG tablet Take 1 tablet (125 mcg total) by mouth daily before breakfast. 90 tablet 1   meloxicam (MOBIC) 15 MG tablet TAKE 1 TABLET(15 MG) BY MOUTH DAILY 30 tablet 0   solifenacin (VESICARE) 5 MG tablet TAKE 1 TABLET(5 MG) BY MOUTH DAILY 90 tablet 2   omeprazole (PRILOSEC) 20 MG capsule TAKE 1 CAPSULE(20 MG) BY MOUTH DAILY 90 capsule 2   No facility-administered medications prior to visit.    Review of Systems  Patient denies headache, fevers, malaise, unintentional weight loss, skin rash, eye pain, sinus congestion and sinus pain, sore throat, dysphagia,  hemoptysis , cough, dyspnea, wheezing, chest pain, palpitations, orthopnea, edema, abdominal pain, nausea, melena, diarrhea, constipation, flank pain, dysuria, hematuria, urinary  Frequency, nocturia, numbness, tingling, seizures,  Focal weakness, Loss of consciousness,  Tremor, insomnia, depression, anxiety, and suicidal ideation.     Objective:  BP 128/76   Pulse (!) 59   Ht 5\' 6"  (1.676 m)   Wt 228 lb 9.6 oz (103.7 kg)   SpO2 96%   BMI 36.90 kg/m   Physical Exam    Assessment & Plan:  Estrogen deficiency -     DG Bone Density; Future  Hypothyroidism, postradioiodine therapy -     TSH  Aphthous ulcer of ileum Assessment & Plan: Found in the terminal ileum during 2023 colonoscopy .  Biopsy taken, no  malignant cells. CMG/HSV histologies negative.   Repeat colonoscopy in 5 yrs due to present of tubular adenomas.    B12 deficiency Assessment & Plan: Checking IF ab today.  Continue monthly injections.  Not vegetarian,  history  of terminal ileum ulcer    Chronic diarrhea Assessment & Plan: Etiology unclear despite EGD/colonoscopy, which were done Jan 2023.  Multiple biopsies taken; no evidence of celiac  disease microscopic colitis or Crohn's.  Pancreatic elastase was ordered Sept 2023 and never done (patient failed to submit a fecal sample) .    Essential hypertension Assessment & Plan: Patient is taking her medications as prescribed and notes no adverse effects.  Home BP readings have been done about once per week and are  generally < 130/80  Her BP this mornign at home was 126/78.   She is avoiding added salt in her diet a  encouraged to start walking 3 times per week for exercise  .    Obesity (BMI 30.0-34.9) Assessment & Plan: 4 lb seight loss is unintentional due to use of soft diet due to mouth pain following multiple upper teeth extraction    Encounter for preventive health examination Assessment & Plan: age appropriate education and counseling updated, referrals for preventative services  and immunizations addressed, dietary and smoking counseling addressed, most recent labs reviewed.  I have personally reviewed and have noted:   1) the patient's medical and social history 2) The pt's use of alcohol, tobacco, and illicit drugs 3) The patient's current medications and supplements 4) Functional ability including ADL's, fall risk, home safety risk, hearing and visual impairment 5) Diet and physical activities 6) Evidence for depression or mood disorder 7) The patient's height, weight, and BMI have been recorded in the chart  I have made referrals, and provided counseling and education based on review of the above    Other orders -     Omeprazole; TAKE 1 CAPSULE(20 MG) BY  MOUTH DAILY  Dispense: 90 capsule; Refill: 2     Follow-up: No follow-ups on file.   Sherlene Shams, MD

## 2023-09-29 NOTE — Assessment & Plan Note (Signed)

## 2023-10-04 LAB — INTRINSIC FACTOR ANTIBODIES: Intrinsic Factor: POSITIVE — AB

## 2023-10-22 ENCOUNTER — Other Ambulatory Visit: Payer: Self-pay | Admitting: Internal Medicine

## 2023-10-22 DIAGNOSIS — E785 Hyperlipidemia, unspecified: Secondary | ICD-10-CM

## 2023-10-25 ENCOUNTER — Telehealth: Payer: Self-pay

## 2023-10-25 DIAGNOSIS — E785 Hyperlipidemia, unspecified: Secondary | ICD-10-CM

## 2023-10-25 MED ORDER — CITALOPRAM HYDROBROMIDE 20 MG PO TABS: 20.0000 mg | ORAL_TABLET | Freq: Every day | ORAL | 0 refills | Status: DC

## 2023-10-25 MED ORDER — ATORVASTATIN CALCIUM 40 MG PO TABS: ORAL_TABLET | ORAL | 0 refills | Status: DC

## 2023-10-25 NOTE — Telephone Encounter (Signed)
Medications have been sent to pharmacy requested and pt is aware.

## 2023-10-25 NOTE — Telephone Encounter (Signed)
Patient states she is in [place] and forgot to bring her medication.  Patient states she needs her atorvastatin (LIPITOR) 40 MG tablet and citalopram (CELEXA) 20 MG tablet.  Prescription Request  10/25/2023  LOV: Visit date not found  What is the name of the medication or equipment? citalopram (CELEXA) 20 MG tablet and atorvastatin (LIPITOR) 40 MG tablet  Have you contacted your pharmacy to request a refill? No   Which pharmacy would you like this sent to?   [id number] [place], [place] - [address] AT [facility] OF [facility] & [address] [place] [address] Phone: [phone] Fax: [phone]    Patient notified that their request is being sent to the clinical staff for review and that they should receive a response within 2 business days.   Please advise at Mobile [phone] (mobile)  Patient states she called our office over the weekend and the nurse was able to give her three pills, but they are gone now.  Patient states she would like for [place] to please call her when the prescription has been sent to the pharmacy in [place].

## 2023-10-29 ENCOUNTER — Ambulatory Visit: Payer: BC Managed Care – PPO

## 2023-11-01 ENCOUNTER — Ambulatory Visit: Payer: BC Managed Care – PPO

## 2023-11-02 ENCOUNTER — Ambulatory Visit (INDEPENDENT_AMBULATORY_CARE_PROVIDER_SITE_OTHER): Payer: BC Managed Care – PPO

## 2023-11-02 DIAGNOSIS — E538 Deficiency of other specified B group vitamins: Secondary | ICD-10-CM

## 2023-11-02 MED ORDER — CYANOCOBALAMIN 1000 MCG/ML IJ SOLN
1000.0000 ug | Freq: Once | INTRAMUSCULAR | Status: AC
  Administered 2023-11-02: 1000 ug via INTRAMUSCULAR

## 2023-11-02 NOTE — Progress Notes (Signed)
Patient presented for B 12 injection to right deltoid, patient voiced no concerns nor showed any signs of distress during injection. 

## 2023-12-03 ENCOUNTER — Ambulatory Visit: Payer: BC Managed Care – PPO

## 2023-12-06 ENCOUNTER — Ambulatory Visit (INDEPENDENT_AMBULATORY_CARE_PROVIDER_SITE_OTHER): Payer: BC Managed Care – PPO

## 2023-12-06 DIAGNOSIS — E538 Deficiency of other specified B group vitamins: Secondary | ICD-10-CM | POA: Diagnosis not present

## 2023-12-06 MED ORDER — CYANOCOBALAMIN 1000 MCG/ML IJ SOLN
1000.0000 ug | Freq: Once | INTRAMUSCULAR | Status: AC
  Administered 2023-12-06: 1000 ug via INTRAMUSCULAR

## 2023-12-06 NOTE — Progress Notes (Signed)
 Pt presented for their vitamin B12 injection. Pt was identified through two identifiers. Pt tolerated shot well in their left deltoid.

## 2023-12-10 ENCOUNTER — Other Ambulatory Visit: Payer: Self-pay | Admitting: Internal Medicine

## 2023-12-10 DIAGNOSIS — Z1231 Encounter for screening mammogram for malignant neoplasm of breast: Secondary | ICD-10-CM

## 2023-12-21 ENCOUNTER — Ambulatory Visit
Admission: RE | Admit: 2023-12-21 | Discharge: 2023-12-21 | Disposition: A | Discharge status: Home / Self Care | Payer: BC Managed Care – PPO | Source: Ambulatory Visit | Attending: Internal Medicine | Admitting: Internal Medicine

## 2023-12-21 ENCOUNTER — Ambulatory Visit: Payer: BC Managed Care – PPO

## 2023-12-21 ENCOUNTER — Other Ambulatory Visit: Payer: BC Managed Care – PPO

## 2023-12-21 DIAGNOSIS — Z1231 Encounter for screening mammogram for malignant neoplasm of breast: Secondary | ICD-10-CM

## 2023-12-21 DIAGNOSIS — E2839 Other primary ovarian failure: Secondary | ICD-10-CM | POA: Diagnosis present

## 2023-12-23 ENCOUNTER — Encounter: Payer: Self-pay | Admitting: Internal Medicine

## 2024-01-03 ENCOUNTER — Ambulatory Visit: Payer: BC Managed Care – PPO

## 2024-01-03 DIAGNOSIS — E538 Deficiency of other specified B group vitamins: Secondary | ICD-10-CM

## 2024-01-03 MED ORDER — CYANOCOBALAMIN 1000 MCG/ML IJ SOLN
1000.0000 ug | Freq: Once | INTRAMUSCULAR | Status: AC
  Administered 2024-01-03: 1000 ug via INTRAMUSCULAR

## 2024-01-03 NOTE — Progress Notes (Signed)
After obtaining consent, and per orders of Dr. Clent Ridges, injection of B12 given IM right deltoid by Valentino Nose. Patient tolerated injection well.

## 2024-01-18 ENCOUNTER — Other Ambulatory Visit: Payer: Self-pay | Admitting: Internal Medicine

## 2024-01-18 DIAGNOSIS — E785 Hyperlipidemia, unspecified: Secondary | ICD-10-CM

## 2024-01-18 MED ORDER — ATORVASTATIN CALCIUM 40 MG PO TABS: ORAL_TABLET | ORAL | 4 refills | Status: DC

## 2024-01-18 NOTE — Telephone Encounter (Signed)
 Copied from CRM [id number]. Topic: Clinical - Medication Refill >> Jan 18, 2024  3:08 PM Irine Seal wrote: Most Recent Primary Care Visit:  Provider: Valentino Nose  Department: [facility]  Visit Type: CLINICAL SUPPORT  Date: 01/03/2024  Medication: atorvastatin (LIPITOR) 40 MG tablet    Has the patient contacted their pharmacy? No (Agent: If no, request that the patient contact the pharmacy for the refill. If patient does not wish to contact the pharmacy document the reason why and proceed with request.) (Agent: If yes, when and what did the pharmacy advise?)  Is this the correct pharmacy for this prescription? Yes If no, delete pharmacy and type the correct one.  This is the patient's preferred pharmacy:  [id number] - [place], [place] - [address] AT [facility] OF [address] & [address] [place] [address] [place] [address] Phone: [phone] Fax: [phone]   Has the prescription been filled recently? No  Is the patient out of the medication? No  Has the patient been seen for an appointment in the last year OR does the patient have an upcoming appointment? Yes  Can we respond through MyChart? Yes  Agent: Please be advised that Rx refills may take up to 3 business days. We ask that you follow-up with your pharmacy.

## 2024-01-18 NOTE — Telephone Encounter (Signed)
 Last Fill: 10/25/23  Last OV: 09/29/23 Next OV: 07/03/24 AWV  Routing to provider for review/authorization.

## 2024-01-31 ENCOUNTER — Ambulatory Visit (INDEPENDENT_AMBULATORY_CARE_PROVIDER_SITE_OTHER): Payer: BC Managed Care – PPO

## 2024-01-31 DIAGNOSIS — E538 Deficiency of other specified B group vitamins: Secondary | ICD-10-CM

## 2024-01-31 MED ORDER — CYANOCOBALAMIN 1000 MCG/ML IJ SOLN
1000.0000 ug | Freq: Once | INTRAMUSCULAR | Status: AC
  Administered 2024-01-31: 1000 ug via INTRAMUSCULAR

## 2024-01-31 NOTE — Progress Notes (Signed)
 After obtaining consent, and per orders of Dr. Darrick Huntsman, injection of B-12 given IM in R Deltoid by Malen Gauze, CMA. Patient tolerated injection well at today's visit.

## 2024-02-22 ENCOUNTER — Other Ambulatory Visit: Payer: Self-pay | Admitting: Internal Medicine

## 2024-02-24 MED ORDER — HYOSCYAMINE SULFATE 0.125 MG PO TBDP: 0.1250 mg | ORAL_TABLET | Freq: Four times a day (QID) | ORAL | 2 refills | Status: AC | PRN

## 2024-02-28 ENCOUNTER — Ambulatory Visit

## 2024-02-29 ENCOUNTER — Ambulatory Visit (INDEPENDENT_AMBULATORY_CARE_PROVIDER_SITE_OTHER)

## 2024-02-29 DIAGNOSIS — E538 Deficiency of other specified B group vitamins: Secondary | ICD-10-CM

## 2024-02-29 MED ORDER — CYANOCOBALAMIN 1000 MCG/ML IJ SOLN
1000.0000 ug | Freq: Once | INTRAMUSCULAR | Status: AC
  Administered 2024-02-29: 1000 ug via INTRAMUSCULAR

## 2024-02-29 NOTE — Progress Notes (Signed)
 Patient presented for B 12 injection to left deltoid, patient voiced no concerns nor showed any signs of distress during injection.

## 2024-03-30 ENCOUNTER — Ambulatory Visit

## 2024-03-31 ENCOUNTER — Ambulatory Visit

## 2024-04-04 ENCOUNTER — Ambulatory Visit

## 2024-04-04 DIAGNOSIS — E538 Deficiency of other specified B group vitamins: Secondary | ICD-10-CM | POA: Diagnosis not present

## 2024-04-04 MED ORDER — CYANOCOBALAMIN 1000 MCG/ML IJ SOLN
1000.0000 ug | Freq: Once | INTRAMUSCULAR | Status: AC
  Administered 2024-04-04: 1000 ug via INTRAMUSCULAR

## 2024-04-04 NOTE — Progress Notes (Signed)
 Patient presented for B 12 injection to left deltoid, patient voiced no concerns nor showed any signs of distress during injection.

## 2024-04-20 ENCOUNTER — Other Ambulatory Visit: Payer: Self-pay | Admitting: Internal Medicine

## 2024-04-20 DIAGNOSIS — I10 Essential (primary) hypertension: Secondary | ICD-10-CM

## 2024-05-05 ENCOUNTER — Ambulatory Visit (INDEPENDENT_AMBULATORY_CARE_PROVIDER_SITE_OTHER)

## 2024-05-05 DIAGNOSIS — E538 Deficiency of other specified B group vitamins: Secondary | ICD-10-CM

## 2024-05-05 MED ORDER — CYANOCOBALAMIN 1000 MCG/ML IJ SOLN
1000.0000 ug | Freq: Once | INTRAMUSCULAR | Status: AC
  Administered 2024-05-05: 1000 ug via INTRAMUSCULAR

## 2024-05-05 NOTE — Progress Notes (Signed)
 After obtaining consent, and per orders of Dr.Tullo, injection of B-12 given IM in R Deltoid by Virgina Grills, CMA. Patient tolerated injection well.

## 2024-06-02 ENCOUNTER — Ambulatory Visit

## 2024-06-02 DIAGNOSIS — E538 Deficiency of other specified B group vitamins: Secondary | ICD-10-CM

## 2024-06-02 MED ORDER — CYANOCOBALAMIN 1000 MCG/ML IJ SOLN
1000.0000 ug | Freq: Once | INTRAMUSCULAR | Status: AC
  Administered 2024-06-02: 1000 ug via INTRAMUSCULAR

## 2024-06-02 NOTE — Progress Notes (Signed)
Pt received B12 injection in Left deltoid. Pt tolerated it well with no complaints or concerns.

## 2024-06-14 ENCOUNTER — Other Ambulatory Visit: Payer: Self-pay | Admitting: Internal Medicine

## 2024-06-30 ENCOUNTER — Other Ambulatory Visit: Payer: Self-pay | Admitting: Internal Medicine

## 2024-06-30 DIAGNOSIS — E890 Postprocedural hypothyroidism: Secondary | ICD-10-CM

## 2024-07-03 ENCOUNTER — Ambulatory Visit

## 2024-07-03 ENCOUNTER — Ambulatory Visit (INDEPENDENT_AMBULATORY_CARE_PROVIDER_SITE_OTHER)

## 2024-07-03 DIAGNOSIS — E538 Deficiency of other specified B group vitamins: Secondary | ICD-10-CM | POA: Diagnosis not present

## 2024-07-03 MED ORDER — CYANOCOBALAMIN 1000 MCG/ML IJ SOLN
1000.0000 ug | Freq: Once | INTRAMUSCULAR | Status: AC
  Administered 2024-07-03 (×2): 1000 ug via INTRAMUSCULAR

## 2024-07-03 NOTE — Telephone Encounter (Signed)
 Medication was discontinued on 03/29/2023. Pt is currently taking a different dose. Please refuse request.

## 2024-07-03 NOTE — Progress Notes (Signed)
 Pt presented for their vitamin B12 injection. Pt was identified through two identifiers. Pt tolerated shot well in their right deltoid.

## 2024-07-04 ENCOUNTER — Telehealth: Payer: Self-pay

## 2024-07-04 ENCOUNTER — Ambulatory Visit

## 2024-07-04 VITALS — Ht 66.0 in | Wt 214.0 lb

## 2024-07-04 DIAGNOSIS — Z79899 Other long term (current) drug therapy: Secondary | ICD-10-CM

## 2024-07-04 DIAGNOSIS — Z0000 Encounter for general adult medical examination without abnormal findings: Secondary | ICD-10-CM

## 2024-07-04 DIAGNOSIS — R7301 Impaired fasting glucose: Secondary | ICD-10-CM

## 2024-07-04 DIAGNOSIS — E890 Postprocedural hypothyroidism: Secondary | ICD-10-CM

## 2024-07-04 DIAGNOSIS — E785 Hyperlipidemia, unspecified: Secondary | ICD-10-CM

## 2024-07-04 DIAGNOSIS — I10 Essential (primary) hypertension: Secondary | ICD-10-CM

## 2024-07-04 MED ORDER — LEVOTHYROXINE SODIUM 125 MCG PO TABS: 125.0000 ug | ORAL_TABLET | Freq: Every day | ORAL | 0 refills | Status: DC

## 2024-07-04 NOTE — Patient Instructions (Signed)
 Julie Perez , Thank you for taking time out of your busy schedule to complete your Annual Wellness Visit with me. I enjoyed our conversation and look forward to speaking with you again next year. I, as well as your care team,  appreciate your ongoing commitment to your health goals. Please review the following plan we discussed and let me know if I can assist you in the future. Your Game plan/ To Do List    Referrals: If you haven't heard from the office you've been referred to, please reach out to them at the phone provided.  Remember to get your annual flu vaccine  Follow up Visits: We will see or speak with you next year for your Next Medicare AWV with our clinical staff 07/09/25 @ 1:40 Have you seen your provider in the last 6 months (3 months if uncontrolled diabetes)? No Remember your upcoming appointment with your PCP  Clinician Recommendations:  Aim for 30 minutes of exercise or brisk walking, 6-8 glasses of water, and 5 servings of fruits and vegetables each day.       This is a list of the screenings recommended for you:  Health Maintenance  Topic Date Due   COVID-19 Vaccine (5 - 2024-25 season) 07/25/2023   Flu Shot  06/23/2024   Mammogram  12/20/2024   DTaP/Tdap/Td vaccine (2 - Td or Tdap) 05/20/2026   Colon Cancer Screening  12/10/2026   Pneumococcal Vaccine for age over 29  Completed   DEXA scan (bone density measurement)  Completed   Hepatitis C Screening  Completed   Zoster (Shingles) Vaccine  Completed   Hepatitis B Vaccine  Aged Out   HPV Vaccine  Aged Out   Meningitis B Vaccine  Aged Out    Advanced directives: (Copy Requested) Please bring a copy of your health care power of attorney and living will to the office to be added to your chart at your convenience. You can mail to [facility] [address] [place], [place] [address] or email to [email or web address] Advance Care Planning is important because it:  [x]  Makes sure you receive the  medical care that is consistent with your values, goals, and preferences  [x]  It provides guidance to your family and loved ones and reduces their decisional burden about whether or not they are making the right decisions based on your wishes.

## 2024-07-04 NOTE — Telephone Encounter (Signed)
 Called pt and scheduled her for a 6 month follow and labs prior to her appt. Lab orders have been placed. Medication has been refilled.

## 2024-07-04 NOTE — Progress Notes (Signed)
 Subjective:   Julie Perez is a 78 y.o. who presents for a Medicare Wellness preventive visit.  As a reminder, Annual Wellness Visits don't include a physical exam, and some assessments may be limited, especially if this visit is performed virtually. We may recommend an in-person follow-up visit with your provider if needed.  Visit Complete: Virtual I connected with  Julie Perez on 07/04/24 by a audio enabled telemedicine application and verified that I am speaking with the correct person using two identifiers.  Patient Location: Home  Provider Location: Home Office  I discussed the limitations of evaluation and management by telemedicine. The patient expressed understanding and agreed to proceed.  Vital Signs: Because this visit was a virtual/telehealth visit, some criteria may be missing or patient reported. Any vitals not documented were not able to be obtained and vitals that have been documented are patient reported.  VideoDeclined- This patient declined Librarian, academic. Therefore the visit was completed with audio only.  Persons Participating in Visit: Patient.  AWV Questionnaire: No: Patient Medicare AWV questionnaire was not completed prior to this visit.  Cardiac Risk Factors include: advanced age (>16men, >62 women);dyslipidemia;hypertension;obesity (BMI >30kg/m2)     Objective:    Today's Vitals   07/04/24 1258  Weight: 214 lb (97.1 kg)  Height: 5' 6 (1.676 m)   Body mass index is 34.54 kg/m.     07/04/2024    1:11 PM 06/30/2023    1:46 PM 07/12/2022    1:27 PM 04/03/2022    9:14 AM 12/10/2021    7:31 AM 04/16/2020   11:14 AM 09/14/2019   11:12 AM  Advanced Directives  Does Patient Have a Medical Advance Directive? Yes Yes No Yes Yes Yes Yes  Type of Estate agent of [place];Living will Healthcare Power of [place];Living will  Healthcare Power of [place];Living will Healthcare Power of [employer] Power of [employer] Power of [place];Living will  Does patient want to make changes to medical advance directive?    No - Patient declined   No - Patient declined  Copy of Healthcare Power of Attorney in Chart? No - copy requested No - copy requested  No - copy requested  Yes - validated most recent copy scanned in chart (See row information) No - copy requested    Current Medications (verified) Outpatient Encounter Medications as of 07/04/2024  Medication Sig   amLODipine  (NORVASC ) 5 MG tablet TAKE 1 TABLET(5 MG) BY MOUTH AT BEDTIME   atorvastatin  (LIPITOR) 40 MG tablet TAKE 1 TABLET(40 MG) BY MOUTH AT BEDTIME   citalopram  (CELEXA ) 20 MG tablet TAKE 1 TABLET(20 MG) BY MOUTH DAILY   clobetasol  cream (TEMOVATE ) 0.05 % Apply 1 Application topically 2 (two) times daily. To irritated skin (Patient taking differently: Apply 1 Application topically 2 (two) times daily as needed. To irritated skin)   hyoscyamine  (ANASPAZ ) 0.125 MG TBDP disintergrating tablet Place 1 tablet (0.125 mg total) under the tongue every 6 (six) hours as needed. For esophageal spasm   levothyroxine  (SYNTHROID ) 125 MCG tablet Take 1 tablet (125 mcg total) by mouth daily before breakfast.   meloxicam  (MOBIC ) 15 MG tablet TAKE 1 TABLET(15 MG) BY MOUTH DAILY   solifenacin  (VESICARE ) 5 MG tablet TAKE 1 TABLET(5 MG) BY MOUTH DAILY   omeprazole  (PRILOSEC) 20 MG capsule TAKE 1 CAPSULE(20 MG) BY MOUTH DAILY (Patient not taking: Reported on 07/04/2024)   No facility-administered encounter medications on file as of 07/04/2024.    Allergies (verified)  Patient has no known allergies.   History: Past Medical History:  Diagnosis Date   Anxiety    Arthritis    lower back   Frequent headaches    stress  related   GERD (gastroesophageal reflux disease)    Grade I diastolic dysfunction 05/17/2018   Echo June 2019   High cholesterol    Hyperlipidemia    Hypertension    Hyperthyroidism    Migraines    Mild  concentric left ventricular hypertrophy 05/17/2018   Echo June 2019   S/P hysterectomy 09/12/2014   Due to fibroids,    Thyroid  disease    Past Surgical History:  Procedure Laterality Date   ABDOMINAL HYSTERECTOMY  1979   COLONOSCOPY WITH PROPOFOL  N/A 12/10/2021   Procedure: COLONOSCOPY WITH PROPOFOL ;  Surgeon: Unk Corinn Skiff, MD;  Location: [facility] ENDOSCOPY;  Service: Gastroenterology;  Laterality: N/A;   ESOPHAGOGASTRODUODENOSCOPY N/A 12/10/2021   Procedure: ESOPHAGOGASTRODUODENOSCOPY (EGD);  Surgeon: Unk Corinn Skiff, MD;  Location: [facility]COPY;  Service: Gastroenterology;  Laterality: N/A;   SHOULDER ARTHROSCOPY WITH ROTATOR CUFF REPAIR Left 09/14/2019   Procedure: SHOULDER ARTHROSCOPY WITH ROTATOR CUFF REPAIR;  Surgeon: Leora Lynwood SAUNDERS, MD;  Location[facility];  Service: Orthopedics;  Laterality: Left;   VAGINAL HYSTERECTOMY  1979   Family History  Problem Relation Age of Onset   Hypertension Mother    Alzheimer's disease Mother    Arthritis Mother    Cancer Father 53       prostate cancer   Cancer Sister 24       breast cancer   Breast cancer Sister 37   Aneurysm Sister    Diabetes Brother        type 2   Heart disease Sister        cabg x 4, tobacco abuse   Diabetes Sister        type 2   Lung cancer Nephew    Social History   Socioeconomic History   Marital status: Married    Spouse name: Lorence   Number of children: 2   Years of education: Not on file   Highest education level: 12th grade  Occupational History   Occupation: Retired  Tobacco Use   Smoking status: Never   Smokeless tobacco: Never   Tobacco comments:    smoking cessation materials not required  Vaping Use   Vaping status: Never Used  Substance and Sexual Activity   Alcohol use: Yes    Alcohol/week: 1.0 standard drink of alcohol    Types: 1 Glasses of wine per week    Comment: rare   Drug use: No   Sexual activity: Not Currently  Other Topics Concern   Not on file   Social History Narrative   Pt lives alone. Husband lives out of state.   Social Drivers of Corporate investment banker Strain: Low Risk  (07/04/2024)   Overall Financial Resource Strain (CARDIA)    Difficulty of Paying Living Expenses: Not hard at all  Food Insecurity: No Food Insecurity (07/04/2024)   Hunger Vital Sign    Worried About Running Out of Food in the Last Year: Never true    Ran Out of Food in the Last Year: Never true  Transportation Needs: No Transportation Needs (07/04/2024)   PRAPARE - Administrator, Civil Service (Medical): No    Lack of Transportation (Non-Medical): No  Physical Activity: Inactive (07/04/2024)   Exercise Vital Sign    Days of Exercise per Week:  0 days    Minutes of Exercise per Session: 0 min  Stress: No Stress Concern Present (07/04/2024)   [employer] of Occupational Health - Occupational Stress Questionnaire    Feeling of Stress: Not at all  Social Connections: Socially Isolated (07/04/2024)   Social Connection and Isolation Panel    Frequency of Communication with Friends and Family: More than three times a week    Frequency of Social Gatherings with Friends and Family: More than three times a week    Attends Religious Services: Never    Database administrator or Organizations: No    Attends Engineer, structural: Never    Marital Status: Separated    Tobacco Counseling Counseling given: Not Answered Tobacco comments: smoking cessation materials not required    Clinical Intake:  Pre-visit preparation completed: Yes  Pain : No/denies pain     BMI - recorded: 34.54 Nutritional Status: BMI > 30  Obese Nutritional Risks: None Diabetes: No  Lab Results  Component Value Date   HGBA1C 5.9 06/29/2023   HGBA1C 5.4 09/01/2019   HGBA1C 5.5 01/04/2017     How often do you need to have someone help you when you read instructions, pamphlets, or other written materials from your doctor or pharmacy?: 1 -  Never  Interpreter Needed?: No  Information entered by :: R. Ferol Laiche LPN   Activities of Daily Living     07/04/2024   12:59 PM  In your present state of health, do you have any difficulty performing the following activities:  Hearing? 0  Vision? 0  Difficulty concentrating or making decisions? 1  Walking or climbing stairs? 0  Dressing or bathing? 0  Doing errands, shopping? 0  Preparing Food and eating ? N  Using the Toilet? N  In the past six months, have you accidently leaked urine? Y  Do you have problems with loss of bowel control? N  Managing your Medications? N  Managing your Finances? N  Housekeeping or managing your Housekeeping? N    Patient Care Team: Marylynn Verneita CROME, MD as PCP - General (Internal Medicine) Verlinda Boas, PA-C as Consulting Physician (Orthopedic Surgery)  I have updated your Care Teams any recent Medical Services you may have received from other providers in the past year.     Assessment:   This is a routine wellness examination for [facility].  Hearing/Vision screen Hearing Screening - Comments:: No issues Vision Screening - Comments:: readers   Goals Addressed             This Visit's Progress    Patient Stated       Wants to lose some weight       Depression Screen     07/04/2024    1:05 PM 09/29/2023    8:41 AM 06/30/2023    1:40 PM 06/16/2023    4:25 PM 03/29/2023    1:02 PM 12/22/2022    3:14 PM 04/03/2022    9:12 AM  PHQ 2/9 Scores  PHQ - 2 Score 0 0 0 0 0 0 0  PHQ- 9 Score 3  9        Fall Risk     07/04/2024    1:02 PM 09/29/2023    8:41 AM 06/30/2023    1:37 PM 06/30/2023   10:53 AM 06/16/2023    4:25 PM  Fall Risk   Falls in the past year? 0 0 0 0 0  Number falls in past yr: 0 0 0 0  0  Injury with Fall? 0 0 0 0 0  Risk for fall due to : No Fall Risks No Fall Risks No Fall Risks  No Fall Risks  Follow up Falls evaluation completed;Falls prevention discussed Falls evaluation completed Falls prevention discussed;Falls  evaluation completed  Falls evaluation completed    MEDICARE RISK AT HOME:  Medicare Risk at Home Any stairs in or around the home?: Yes If so, are there any without handrails?: No Home free of loose throw rugs in walkways, pet beds, electrical cords, etc?: Yes Adequate lighting in your home to reduce risk of falls?: Yes Life alert?: No Use of a cane, walker or w/c?: No Grab bars in the bathroom?: Yes Shower chair or bench in shower?: No Elevated toilet seat or a handicapped toilet?: No  TIMED UP AND GO:  Was the test performed?  No  Cognitive Function: 6CIT completed    05/20/2017    2:43 PM  MMSE - Mini Mental State Exam  Orientation to time 5   Orientation to Place 5   Registration 3   Attention/ Calculation 5   Recall 3   Language- name 2 objects 2   Language- repeat 1  Language- follow 3 step command 3   Language- read & follow direction 1   Write a sentence 1   Copy design 1   Total score 30      Data saved with a previous flowsheet row definition        07/04/2024    1:11 PM 06/30/2023    1:46 PM 04/16/2020   11:18 AM 08/23/2018    2:08 PM  6CIT Screen  What Year? 0 points 0 points 0 points 0 points  What month? 0 points 0 points 0 points 0 points  What time? 0 points 0 points 0 points 0 points  Count back from 20 0 points 0 points 0 points 0 points  Months in reverse 0 points 0 points 0 points 0 points  Repeat phrase 0 points 0 points 0 points 0 points  Total Score 0 points 0 points 0 points 0 points    Immunizations Immunization History  Administered Date(s) Administered   Fluad Quad(high Dose 65+) 09/18/2021, 08/19/2022   Fluad Trivalent(High Dose 65+) 08/27/2023   Influenza, High Dose Seasonal PF 09/08/2017, 08/19/2018, 07/27/2019   Influenza,inj,Quad PF,6+ Mos 09/12/2014, 07/17/2015   Influenza-Unspecified 08/23/2016, 08/23/2017, 08/23/2020   PFIZER(Purple Top)SARS-COV-2 Vaccination 01/11/2020, 01/31/2020, 08/23/2020   Pneumococcal Conjugate-13  05/20/2017   Pneumococcal Polysaccharide-23 07/10/2013, 07/12/2020   Tdap 05/20/2016   Zoster, Live 07/06/2014    Screening Tests Health Maintenance  Topic Date Due   COVID-19 Vaccine (5 - 2024-25 season) 07/25/2023   INFLUENZA VACCINE  06/23/2024   MAMMOGRAM  12/20/2024   DTaP/Tdap/Td (2 - Td or Tdap) 05/20/2026   Colonoscopy  12/10/2026   Pneumococcal Vaccine: 50+ Years  Completed   DEXA SCAN  Completed   Hepatitis C Screening  Completed   Zoster Vaccines- Shingrix  Completed   Hepatitis B Vaccines  Aged Out   HPV VACCINES  Aged Out   Meningococcal B Vaccine  Aged Out    Health Maintenance  Health Maintenance Due  Topic Date Due   COVID-19 Vaccine (5 - 2024-25 season) 07/25/2023   INFLUENZA VACCINE  06/23/2024   Health Maintenance Items Addressed: Discussed the need to get a flu vaccine annually. Patient declines covid vaccines  Additional Screening:  Vision Screening: Recommended annual ophthalmology exams for early detection of glaucoma and other disorders  of the eye.Up to date [facility] Would you like a referral to an eye doctor? No    Dental Screening: Recommended annual dental exams for proper oral hygiene  Community Resource Referral / Chronic Care Management: CRR required this visit?  No   CCM required this visit?  No   Plan:    I have personally reviewed and noted the following in the patient's chart:   Medical and social history Use of alcohol, tobacco or illicit drugs  Current medications and supplements including opioid prescriptions. Patient is not currently taking opioid prescriptions. Functional ability and status Nutritional status Physical activity Advanced directives List of other physicians Hospitalizations, surgeries, and ER visits in previous 12 months Vitals Screenings to include cognitive, depression, and falls Referrals and appointments  In addition, I have reviewed and discussed with patient certain preventive protocols,  quality metrics, and best practice recommendations. A written personalized care plan for preventive services as well as general preventive health recommendations were provided to patient.   Angeline Fredericks, LPN   [id number]   After Visit Summary: (MyChart) Due to this being a telephonic visit, the after visit summary with patients personalized plan was offered to patient via MyChart   Notes: Nothing significant to report at this time.

## 2024-07-04 NOTE — Addendum Note (Signed)
 Addended by: HARRIETTE RAISIN on: 07/04/2024 10:11 AM   Modules accepted: Orders

## 2024-07-10 ENCOUNTER — Other Ambulatory Visit: Payer: Self-pay

## 2024-07-10 DIAGNOSIS — E785 Hyperlipidemia, unspecified: Secondary | ICD-10-CM

## 2024-07-10 MED ORDER — ATORVASTATIN CALCIUM 40 MG PO TABS: ORAL_TABLET | ORAL | 1 refills | Status: DC

## 2024-07-17 ENCOUNTER — Other Ambulatory Visit: Payer: Self-pay | Admitting: Internal Medicine

## 2024-07-17 DIAGNOSIS — E785 Hyperlipidemia, unspecified: Secondary | ICD-10-CM

## 2024-07-17 MED ORDER — ATORVASTATIN CALCIUM 40 MG PO TABS: ORAL_TABLET | ORAL | 0 refills | Status: DC

## 2024-07-17 NOTE — Telephone Encounter (Signed)
 Copied from CRM [id number]. Topic: Clinical - Medication Refill >> Jul 17, 2024 10:44 AM Thersia C wrote: Medication: atorvastatin  (LIPITOR) 40 MG tablet - stated she needs 7 pills until her appointment 09/02  Has the patient contacted their pharmacy? Yes (Agent: If no, request that the patient contact the pharmacy for the refill. If patient does not wish to contact the pharmacy document the reason why and proceed with request.) (Agent: If yes, when and what did the pharmacy advise?)  This is the patient's preferred pharmacy:  [id number] [place], [place] - [address] AT [facility] OF [address] [place] [address] Phone: [phone] Fax: [phone]  Is this the correct pharmacy for this prescription? Yes If no, delete pharmacy and type the correct one.   Has the prescription been filled recently? No  Is the patient out of the medication? Yes  Has the patient been seen for an appointment in the last year OR does the patient have an upcoming appointment? Yes  Can we respond through MyChart? Yes  Agent: Please be advised that Rx refills may take up to 3 business days. We ask that you follow-up with your pharmacy.

## 2024-07-17 NOTE — Telephone Encounter (Signed)
 Called Pt to get an update on her Lipitor she states she only has 3 left.

## 2024-07-21 ENCOUNTER — Other Ambulatory Visit (INDEPENDENT_AMBULATORY_CARE_PROVIDER_SITE_OTHER)

## 2024-07-21 DIAGNOSIS — Z79899 Other long term (current) drug therapy: Secondary | ICD-10-CM

## 2024-07-21 DIAGNOSIS — E890 Postprocedural hypothyroidism: Secondary | ICD-10-CM | POA: Diagnosis not present

## 2024-07-21 DIAGNOSIS — E785 Hyperlipidemia, unspecified: Secondary | ICD-10-CM | POA: Diagnosis not present

## 2024-07-21 DIAGNOSIS — R7301 Impaired fasting glucose: Secondary | ICD-10-CM

## 2024-07-21 DIAGNOSIS — I10 Essential (primary) hypertension: Secondary | ICD-10-CM | POA: Diagnosis not present

## 2024-07-21 LAB — COMPREHENSIVE METABOLIC PANEL WITH GFR
ALT: 17 U/L (ref 0–35)
AST: 16 U/L (ref 0–37)
Albumin: 4.2 g/dL (ref 3.5–5.2)
Alkaline Phosphatase: 69 U/L (ref 39–117)
BUN: 15 mg/dL (ref 6–23)
CO2: 29 meq/L (ref 19–32)
Calcium: 8.8 mg/dL (ref 8.4–10.5)
Chloride: 103 meq/L (ref 96–112)
Creatinine, Ser: 0.62 mg/dL (ref 0.40–1.20)
GFR: 86.08 mL/min (ref >60.00)
Glucose, Bld: 103 mg/dL — ABNORMAL HIGH (ref 70–99)
Potassium: 4 meq/L (ref 3.5–5.1)
Sodium: 142 meq/L (ref 135–145)
Total Bilirubin: 1 mg/dL (ref 0.2–1.2)
Total Protein: 6.7 g/dL (ref 6.0–8.3)

## 2024-07-21 LAB — CBC WITH DIFFERENTIAL/PLATELET
Basophils Absolute: 0 K/uL (ref 0.0–0.1)
Basophils Relative: 0.7 % (ref 0.0–3.0)
Eosinophils Absolute: 0.1 K/uL (ref 0.0–0.7)
Eosinophils Relative: 2.6 % (ref 0.0–5.0)
HCT: 39.5 % (ref 36.0–46.0)
Hemoglobin: 13.3 g/dL (ref 12.0–15.0)
Lymphocytes Relative: 33 % (ref 12.0–46.0)
Lymphs Abs: 1.7 K/uL (ref 0.7–4.0)
MCHC: 33.7 g/dL (ref 30.0–36.0)
MCV: 87.4 fl (ref 78.0–100.0)
Monocytes Absolute: 0.5 K/uL (ref 0.1–1.0)
Monocytes Relative: 9.3 % (ref 3.0–12.0)
Neutro Abs: 2.8 K/uL (ref 1.4–7.7)
Neutrophils Relative %: 54.4 % (ref 43.0–77.0)
Platelets: 222 K/uL (ref 150.0–400.0)
RBC: 4.52 Mil/uL (ref 3.87–5.11)
RDW: 14.2 % (ref 11.5–15.5)
WBC: 5.2 K/uL (ref 4.0–10.5)

## 2024-07-21 LAB — LDL CHOLESTEROL, DIRECT: Direct LDL: 105 mg/dL

## 2024-07-21 LAB — LIPID PANEL
Cholesterol: 173 mg/dL (ref 0–200)
HDL: 43 mg/dL (ref >39.00)
LDL Cholesterol: 101 mg/dL — ABNORMAL HIGH (ref 0–99)
NonHDL: 130.43
Total CHOL/HDL Ratio: 4
Triglycerides: 146 mg/dL (ref 0.0–149.0)
VLDL: 29.2 mg/dL (ref 0.0–40.0)

## 2024-07-21 LAB — HEMOGLOBIN A1C: Hgb A1c MFr Bld: 5.8 % (ref 4.6–6.5)

## 2024-07-21 LAB — TSH: TSH: 0.18 u[IU]/mL — ABNORMAL LOW (ref 0.35–5.50)

## 2024-07-25 ENCOUNTER — Other Ambulatory Visit: Payer: Self-pay | Admitting: Internal Medicine

## 2024-07-25 ENCOUNTER — Ambulatory Visit: Admitting: Internal Medicine

## 2024-07-25 ENCOUNTER — Encounter: Payer: Self-pay | Admitting: Internal Medicine

## 2024-07-25 VITALS — BP 124/68 | HR 69 | Ht 66.0 in | Wt 218.0 lb

## 2024-07-25 DIAGNOSIS — E785 Hyperlipidemia, unspecified: Secondary | ICD-10-CM

## 2024-07-25 DIAGNOSIS — E890 Postprocedural hypothyroidism: Secondary | ICD-10-CM

## 2024-07-25 DIAGNOSIS — E538 Deficiency of other specified B group vitamins: Secondary | ICD-10-CM

## 2024-07-25 DIAGNOSIS — R300 Dysuria: Secondary | ICD-10-CM

## 2024-07-25 DIAGNOSIS — I10 Essential (primary) hypertension: Secondary | ICD-10-CM

## 2024-07-25 DIAGNOSIS — R419 Unspecified symptoms and signs involving cognitive functions and awareness: Secondary | ICD-10-CM

## 2024-07-25 MED ORDER — LEVOTHYROXINE SODIUM 112 MCG PO TABS: 112.0000 ug | ORAL_TABLET | Freq: Every day | ORAL | 0 refills | Status: DC

## 2024-07-25 MED ORDER — AMLODIPINE BESYLATE 5 MG PO TABS: ORAL_TABLET | ORAL | 1 refills | Status: DC

## 2024-07-25 MED ORDER — CITALOPRAM HYDROBROMIDE 20 MG PO TABS: ORAL_TABLET | ORAL | 1 refills | Status: AC

## 2024-07-25 MED ORDER — ATORVASTATIN CALCIUM 40 MG PO TABS: ORAL_TABLET | ORAL | 1 refills | Status: AC

## 2024-07-25 NOTE — Progress Notes (Unsigned)
 Subjective:  Patient ID: Julie Perez, female    DOB: 1946-11-11  Age: 78 y.o. MRN: [id number]  CC: The primary encounter diagnosis was B12 deficiency. Diagnoses of Dysuria, Essential hypertension, Hyperlipidemia LDL goal <130, Hypothyroidism, postradioiodine therapy, Obesity, morbid (HCC), and Cognitive complaints with normal neuropsychological exam were also pertinent to this visit.   HPI Julie Perez presents for  Chief Complaint  Patient presents with   Medical Management of Chronic Issues    6 month follow up     Hypertension: patient checks blood pressure twice weekly at home.  Readings have been for the most part <130/80 at rest . Patient is following a reduced salt diet most days and is taking medications as prescribed   Hypothyroidism:   her last TSH  was low.  She has an intentional weight loss of 18 lbs since last summer but has regained 4.   We discussed the risks of overactive thyroid  and she has agreed to reduce her dose to 112  mcg daily   Memory concerns:  she has become concerned about her inability to remember names of people and words at times.  Her mother had AD  Outpatient Medications Prior to Visit  Medication Sig Dispense Refill   clobetasol  cream (TEMOVATE ) 0.05 % Apply 1 Application topically 2 (two) times daily. To irritated skin (Patient taking differently: Apply 1 Application topically 2 (two) times daily as needed. To irritated skin) 30 g 0   hyoscyamine  (ANASPAZ ) 0.125 MG TBDP disintergrating tablet Place 1 tablet (0.125 mg total) under the tongue every 6 (six) hours as needed. For esophageal spasm 30 tablet 2   meloxicam  (MOBIC ) 15 MG tablet TAKE 1 TABLET(15 MG) BY MOUTH DAILY 30 tablet 0   solifenacin  (VESICARE ) 5 MG tablet TAKE 1 TABLET(5 MG) BY MOUTH DAILY 90 tablet 1   amLODipine  (NORVASC ) 5 MG tablet TAKE 1 TABLET(5 MG) BY MOUTH AT BEDTIME 90 tablet 1   atorvastatin  (LIPITOR) 40 MG tablet TAKE 1 TABLET(40 MG) BY MOUTH AT BEDTIME 30 tablet 0    citalopram  (CELEXA ) 20 MG tablet TAKE 1 TABLET(20 MG) BY MOUTH DAILY 90 tablet 1   levothyroxine  (SYNTHROID ) 125 MCG tablet Take 1 tablet (125 mcg total) by mouth daily before breakfast. 90 tablet 0   omeprazole  (PRILOSEC) 20 MG capsule TAKE 1 CAPSULE(20 MG) BY MOUTH DAILY (Patient not taking: Reported on 07/04/2024) 90 capsule 2   No facility-administered medications prior to visit.    Review of Systems;  Patient denies headache, fevers, malaise, unintentional weight loss, skin rash, eye pain, sinus congestion and sinus pain, sore throat, dysphagia,  hemoptysis , cough, dyspnea, wheezing, chest pain, palpitations, orthopnea, edema, abdominal pain, nausea, melena, diarrhea, constipation, flank pain, dysuria, hematuria, urinary  Frequency, nocturia, numbness, tingling, seizures,  Focal weakness, Loss of consciousness,  Tremor, insomnia, depression, anxiety, and suicidal ideation.      Objective:  BP 124/68   Pulse 69   Ht 5' 6 (1.676 m)   Wt 218 lb (98.9 kg)   SpO2 96%   BMI 35.19 kg/m   BP Readings from Last 3 Encounters:  07/25/24 124/68  09/29/23 128/76  03/29/23 124/78    Wt Readings from Last 3 Encounters:  07/25/24 218 lb (98.9 kg)  07/04/24 214 lb (97.1 kg)  09/29/23 228 lb 9.6 oz (103.7 kg)    Physical Exam Vitals reviewed.  Constitutional:      General: She is not in acute distress.    Appearance: Normal appearance. She is  normal weight. She is not ill-appearing, toxic-appearing or diaphoretic.  HENT:     Head: Normocephalic.  Eyes:     General: No scleral icterus.       Right eye: No discharge.        Left eye: No discharge.     Conjunctiva/sclera: Conjunctivae normal.  Cardiovascular:     Rate and Rhythm: Normal rate and regular rhythm.     Heart sounds: Normal heart sounds.  Pulmonary:     Effort: Pulmonary effort is normal. No respiratory distress.     Breath sounds: Normal breath sounds.  Musculoskeletal:        General: Normal range of motion.   Skin:    General: Skin is warm and dry.  Neurological:     General: No focal deficit present.     Mental Status: She is alert and oriented to person, place, and time. Mental status is at baseline.  Psychiatric:        Mood and Affect: Mood normal.        Behavior: Behavior normal.        Thought Content: Thought content normal.        Judgment: Judgment normal.    MMSE:  29/30     Lab Results  Component Value Date   HGBA1C 5.8 07/21/2024   HGBA1C 5.9 06/29/2023   HGBA1C 5.4 09/01/2019    Lab Results  Component Value Date   CREATININE 0.62 07/21/2024   CREATININE 0.79 06/29/2023   CREATININE 0.77 01/15/2023    Lab Results  Component Value Date   WBC 5.2 07/21/2024   HGB 13.3 07/21/2024   HCT 39.5 07/21/2024   PLT 222.0 07/21/2024   GLUCOSE 103 (H) 07/21/2024   CHOL 173 07/21/2024   TRIG 146.0 07/21/2024   HDL 43.00 07/21/2024   LDLDIRECT 105.0 07/21/2024   LDLCALC 101 (H) 07/21/2024   ALT 17 07/21/2024   AST 16 07/21/2024   NA 142 07/21/2024   K 4.0 07/21/2024   CL 103 07/21/2024   CREATININE 0.62 07/21/2024   BUN 15 07/21/2024   CO2 29 07/21/2024   TSH 0.18 (L) 07/21/2024   HGBA1C 5.8 07/21/2024   MICROALBUR 1.6 07/25/2024    MM 3D SCREENING MAMMOGRAM BILATERAL BREAST Result Date: 12/23/2023 CLINICAL DATA:  Screening. EXAM: DIGITAL SCREENING BILATERAL MAMMOGRAM WITH TOMOSYNTHESIS AND CAD TECHNIQUE: Bilateral screening digital craniocaudal and mediolateral oblique mammograms were obtained. Bilateral screening digital breast tomosynthesis was performed. The images were evaluated with computer-aided detection. COMPARISON:  Previous exam(s). ACR Breast Density Category b: There are scattered areas of fibroglandular density. FINDINGS: There are no findings suspicious for malignancy. IMPRESSION: No mammographic evidence of malignancy. A result letter of this screening mammogram will be mailed directly to the patient. RECOMMENDATION: Screening mammogram in one year.  (Code:SM-B-01Y) BI-RADS CATEGORY  1: Negative. Electronically Signed   By: Delon Music M.D.   On: 12/23/2023 08:33   DG Bone Density Result Date: 12/21/2023 EXAM: DUAL X-RAY ABSORPTIOMETRY (DXA) FOR BONE MINERAL DENSITY IMPRESSION: Your patient Julie Perez completed a BMD test on 12/21/2023 using the [facility] Prodigy Advance DXA System (analysis version: 14.10) manufactured by [employer]. The following summarizes the results of our evaluation. Technologist: LCE PATIENT BIOGRAPHICAL: Name: Julie Perez, Julie Perez Patient ID: [id number] Birth Date: [birth year] Height: 66.5 in. Gender: Female Exam Date: 12/21/2023 Weight: 222.8 lbs. Indications: Advanced Age, Caucasian, Hysterectomy, Oophorectomy Bilateral, POSTmenopausal Fractures: Treatments: Vitamin D  ASSESSMENT: The BMD measured at Femur Neck Left is 0.916 g/cm2 with a T-score of -  0.9. This patient is considered normal according to World Health Organization [facility]) criteria. L-3 and L-4 were excluded due to degenerative changes/compression fracture, etc. The scan quality is good. Site Region Measured Measured WHO Young Adult BMD Date       Age      Classification T-score AP Spine L1-L2 13/21[age] 76.5 Normal 0.9 1.271 g/cm2 DualFemur Neck Left 12/21/2123 76.5 Normal -0.9 0.916 g/cm2 World Health Organization [facility]) criteria for post-menopausal, Caucasian Women: Normal:       T-score at or above -1 SD Osteopenia:   T-score between -1 and -2.5 SD Osteoporosis: T-score at or below -2.5 SD RECOMMENDATIONS: 1. All patients should optimize calcium  and vitamin D  intake. 2. Consider FDA-approved medical therapies in postmenopausal women and men aged 74 years and older, based on the following: a. A hip or vertebral (clinical or morphometric) fracture b. T-score < -2.5 at the femoral neck or spine after appropriate evaluation to exclude secondary causes c. Low bone mass (T-score between -1.0 and -2.5 at the femoral neck or spine) and a 10-year probability of a hip fracture > 3%  or a 10-year probability of a major osteoporosis-related fracture > 20% based on the [place] -adapted WHO algorithm d. Clinician judgment and/or patient preferences may indicate treatment for people with 10-year fracture probabilities above or below these levels FOLLOW-UP: People with diagnosed cases of osteoporosis or at high risk for fracture should have regular bone mineral density tests. For patients eligible for Medicare, routine testing is allowed once every 2 years. The testing frequency can be increased to one year for patients who have rapidly progressing disease, those who are receiving or discontinuing medical therapy to restore bone mass, or have additional risk factors. I have reviewed this report, and agree with the above findings. [facility] Radiology Electronically Signed   By: Rosaline Collet M.D.   On: 12/21/2023 14:35    Assessment & Plan:  .B12 deficiency Assessment & Plan: She has been supplementing B12 since her level was low in 2023. Repeat level needed    Lab Results  Component Value Date   VITAMINB12 179 (L) 08/19/2022     Orders: -     Vitamin B12; Future  Dysuria  Essential hypertension Assessment & Plan: Patient is taking her medications as prescribed and notes no adverse effects.  Home BP readings have been done about once per week and are  generally < 130/80  Her BP this mornign at home was 126/78.   She is avoiding added salt in her diet a  encouraged to start walking 3 times per week for exercise  .   Orders: -     amLODIPine  Besylate; TAKE 1 TABLET(5 MG) BY MOUTH AT BEDTIME  Dispense: 90 tablet; Refill: 1 -     Microalbumin / creatinine urine ratio; Future  Hyperlipidemia LDL goal <130 -     Atorvastatin  Calcium ; TAKE 1 TABLET(40 MG) BY MOUTH AT BEDTIME  Dispense: 90 tablet; Refill: 1  Hypothyroidism, postradioiodine therapy Assessment & Plan: TSH is suppressed.  Reducing  dose to 112 mcg . Rechecking in 6 weeks   Lab Results  Component Value Date    TSH 0.18 (L) 07/21/2024     Orders: -     Levothyroxine  Sodium; Take 1 tablet (112 mcg total) by mouth daily before breakfast.  Dispense: 90 tablet; Refill: 0 -     TSH; Future  Obesity, morbid (HCC) Assessment & Plan: Complicated by hypertension and hyperlipidemia . I have addressed  BMI and recommended wt  loss of 10% of body weigh over the next 6 months using a low glycemic index diet and regular exercise a minimum of 5 days per week.     Cognitive complaints with normal neuropsychological exam Assessment & Plan: MMSE was normal.  Addressed her concerns. Recommend Dr [facility] clinic and  I highly recommend reading the End of Alzheimer's: The First Program to Prevent and Reverse Cognitive Decline  by Cheryl Cary, MD     Other orders -     Citalopram  Hydrobromide; TAKE 1 TABLET(20 MG) BY MOUTH DAILY  Dispense: 90 tablet; Refill: 1    Follow-up: Return in about 6 months (around 01/22/2025).   Verneita LITTIE Kettering, MD

## 2024-07-25 NOTE — Patient Instructions (Signed)
 You might want to try using Relaxium for insomnia  (as seen on TV commercials) . It is available through [employer] and contains all natural supplements:  Melatonin 5 mg  Chamomile 25 mg Passionflower extract 75 mg GABA 100 mg Ashwaganda extract 125 mg Magnesium citrate, glycinate, oxide (100 mg)  L tryptophan 500 mg Valerest (proprietary  ingredient ; probably valeria root extract)      I highly recommend reading the End of Alzheimer's: The First Program to Prevent and Reverse Cognitive Decline  by Cheryl Cary, MD     [email or web address]   is Dr Jamal website     We will recheck your b12 when you come for your next B12 shot BEFORE YOU GET JABBED

## 2024-07-26 LAB — MICROALBUMIN / CREATININE URINE RATIO
Creatinine,U: 208.7 mg/dL
Microalb Creat Ratio: 7.6 mg/g (ref 0.0–30.0)
Microalb, Ur: 1.6 mg/dL (ref 0.0–1.9)

## 2024-07-27 ENCOUNTER — Encounter: Payer: Self-pay | Admitting: Internal Medicine

## 2024-07-27 DIAGNOSIS — R419 Unspecified symptoms and signs involving cognitive functions and awareness: Secondary | ICD-10-CM | POA: Insufficient documentation

## 2024-07-27 NOTE — Assessment & Plan Note (Addendum)
 TSH is suppressed.  Reducing  dose to 100 mcg . Rechecking in 6 weeks   Lab Results  Component Value Date   TSH 0.18 (L) 07/21/2024

## 2024-07-27 NOTE — Assessment & Plan Note (Signed)
 MMSE was normal.  Addressed her concerns. Recommend Dr Jamal clinic and  I highly recommend reading the End of Alzheimer's: The First Program to Prevent and Reverse Cognitive Decline  by Cheryl Cary, MD

## 2024-07-27 NOTE — Assessment & Plan Note (Signed)
 Patient is taking her medications as prescribed and notes no adverse effects.  Home BP readings have been done about once per week and are  generally < 130/80  Her BP this mornign at home was 126/78.   She is avoiding added salt in her diet a  encouraged to start walking 3 times per week for exercise  .

## 2024-07-27 NOTE — Assessment & Plan Note (Addendum)
 Complicated by hypertension and hyperlipidemia . I have addressed  BMI and recommended wt loss of 10% of body weigh over the next 6 months using a low glycemic index diet and regular exercise a minimum of 5 days per week.

## 2024-07-27 NOTE — Assessment & Plan Note (Signed)
 She has been supplementing B12 since her level was low in 2023. Repeat level needed    Lab Results  Component Value Date   VITAMINB12 179 (L) 08/19/2022

## 2024-07-29 ENCOUNTER — Ambulatory Visit: Payer: Self-pay | Admitting: Internal Medicine

## 2024-07-29 DIAGNOSIS — E538 Deficiency of other specified B group vitamins: Secondary | ICD-10-CM

## 2024-07-31 ENCOUNTER — Ambulatory Visit (INDEPENDENT_AMBULATORY_CARE_PROVIDER_SITE_OTHER)

## 2024-07-31 DIAGNOSIS — E538 Deficiency of other specified B group vitamins: Secondary | ICD-10-CM | POA: Diagnosis not present

## 2024-07-31 DIAGNOSIS — E890 Postprocedural hypothyroidism: Secondary | ICD-10-CM

## 2024-07-31 LAB — VITAMIN B12: Vitamin B-12: 279 pg/mL (ref 211–911)

## 2024-07-31 LAB — TSH: TSH: 0.29 u[IU]/mL — ABNORMAL LOW (ref 0.35–5.50)

## 2024-07-31 MED ORDER — CYANOCOBALAMIN 1000 MCG/ML IJ SOLN
1000.0000 ug | Freq: Once | INTRAMUSCULAR | Status: AC
  Administered 2024-07-31: 1000 ug via INTRAMUSCULAR

## 2024-07-31 NOTE — Progress Notes (Signed)
Pt received B12 injection in right deltoid muscle. Pt tolerated it well with no complaints or concerns.  

## 2024-08-02 MED ORDER — LEVOTHYROXINE SODIUM 100 MCG PO TABS: 100.0000 ug | ORAL_TABLET | Freq: Every day | ORAL | 3 refills | Status: DC

## 2024-08-02 NOTE — Assessment & Plan Note (Signed)
 She has been supplementing B12 since her level was low in 2023. Repeat level  still < 300.  Will rec parenteral injections x 4 (weekly)   Lab Results  Component Value Date   VITAMINB12 279 07/31/2024

## 2024-08-02 NOTE — Addendum Note (Signed)
 Addended by: MARYLYNN VERNEITA CROME on: 08/02/2024 01:41 PM   Modules accepted: Orders

## 2024-08-07 ENCOUNTER — Ambulatory Visit

## 2024-08-07 DIAGNOSIS — Z23 Encounter for immunization: Secondary | ICD-10-CM

## 2024-08-07 DIAGNOSIS — E538 Deficiency of other specified B group vitamins: Secondary | ICD-10-CM

## 2024-08-07 MED ORDER — CYANOCOBALAMIN 1000 MCG/ML IJ SOLN
1000.0000 ug | Freq: Once | INTRAMUSCULAR | Status: AC
  Administered 2024-08-07: 1000 ug via INTRAMUSCULAR

## 2024-08-07 NOTE — Progress Notes (Signed)
 Pt presented for their vitamin B12 injection. Pt was identified through two identifiers. Pt tolerated shot well in their right deltoid.  Patient is in office today for a nurse visit for High Dose Flu Immunization. Patient Injection was given in the  Left deltoid. Patient tolerated injection well.

## 2024-08-14 ENCOUNTER — Ambulatory Visit (INDEPENDENT_AMBULATORY_CARE_PROVIDER_SITE_OTHER)

## 2024-08-14 DIAGNOSIS — E538 Deficiency of other specified B group vitamins: Secondary | ICD-10-CM

## 2024-08-14 MED ORDER — CYANOCOBALAMIN 1000 MCG/ML IJ SOLN
1000.0000 ug | Freq: Once | INTRAMUSCULAR | Status: AC
  Administered 2024-08-14: 1000 ug via INTRAMUSCULAR

## 2024-08-14 NOTE — Progress Notes (Signed)
 Pt received B12 injection in Left  deltoid muscle. Pt tolerated it well with no complaints or concerns.

## 2024-08-21 ENCOUNTER — Ambulatory Visit (INDEPENDENT_AMBULATORY_CARE_PROVIDER_SITE_OTHER)

## 2024-08-21 DIAGNOSIS — E538 Deficiency of other specified B group vitamins: Secondary | ICD-10-CM

## 2024-08-21 MED ORDER — CYANOCOBALAMIN 1000 MCG/ML IJ SOLN
1000.0000 ug | Freq: Once | INTRAMUSCULAR | Status: AC
  Administered 2024-08-21: 1000 ug via INTRAMUSCULAR

## 2024-08-21 NOTE — Progress Notes (Addendum)
 Pt presented for their vitamin B12 injection. Pt was identified through two identifiers. Pt tolerated shot well in their right deltoid.

## 2024-08-22 ENCOUNTER — Ambulatory Visit

## 2024-08-30 ENCOUNTER — Other Ambulatory Visit

## 2024-09-06 ENCOUNTER — Other Ambulatory Visit: Payer: Self-pay | Admitting: Internal Medicine

## 2024-09-06 DIAGNOSIS — E890 Postprocedural hypothyroidism: Secondary | ICD-10-CM

## 2024-09-07 NOTE — Telephone Encounter (Signed)
 Medication was discontinued on 07/25/2024. Is it okay to refuse request?

## 2024-09-21 ENCOUNTER — Ambulatory Visit

## 2024-09-21 ENCOUNTER — Other Ambulatory Visit

## 2024-09-21 DIAGNOSIS — E538 Deficiency of other specified B group vitamins: Secondary | ICD-10-CM

## 2024-09-21 DIAGNOSIS — E890 Postprocedural hypothyroidism: Secondary | ICD-10-CM

## 2024-09-21 MED ORDER — CYANOCOBALAMIN 1000 MCG/ML IJ SOLN
1000.0000 ug | Freq: Once | INTRAMUSCULAR | Status: AC
  Administered 2024-09-21: 1000 ug via INTRAMUSCULAR

## 2024-09-21 NOTE — Progress Notes (Signed)
Pt presented today for b12 injection. Left deltoid, IM. Pt voiced no concerns nor showed any signs of distress during injection. 

## 2024-09-22 LAB — TSH: TSH: 1.5 u[IU]/mL (ref 0.35–5.50)

## 2024-09-29 ENCOUNTER — Other Ambulatory Visit: Payer: Self-pay

## 2024-09-29 DIAGNOSIS — I10 Essential (primary) hypertension: Secondary | ICD-10-CM

## 2024-09-29 MED ORDER — AMLODIPINE BESYLATE 5 MG PO TABS: ORAL_TABLET | ORAL | 1 refills | Status: AC

## 2024-10-28 ENCOUNTER — Other Ambulatory Visit: Payer: Self-pay | Admitting: Internal Medicine

## 2024-10-28 DIAGNOSIS — E890 Postprocedural hypothyroidism: Secondary | ICD-10-CM

## 2024-10-31 ENCOUNTER — Other Ambulatory Visit: Payer: Self-pay | Admitting: Internal Medicine

## 2024-10-31 MED ORDER — LEVOTHYROXINE SODIUM 100 MCG PO TABS: 100.0000 ug | ORAL_TABLET | Freq: Every day | ORAL | 3 refills | Status: AC

## 2024-11-01 ENCOUNTER — Ambulatory Visit (INDEPENDENT_AMBULATORY_CARE_PROVIDER_SITE_OTHER)

## 2024-11-01 DIAGNOSIS — E538 Deficiency of other specified B group vitamins: Secondary | ICD-10-CM

## 2024-11-01 MED ORDER — CYANOCOBALAMIN 1000 MCG/ML IJ SOLN
1000.0000 ug | Freq: Once | INTRAMUSCULAR | Status: AC
  Administered 2024-11-01: 1000 ug via INTRAMUSCULAR

## 2024-11-01 NOTE — Progress Notes (Signed)
 Patient was administered a B12 injection into her right deltoid. Patient tolerated the B12 injection well.

## 2024-11-27 ENCOUNTER — Encounter

## 2024-12-04 ENCOUNTER — Ambulatory Visit

## 2024-12-04 DIAGNOSIS — E538 Deficiency of other specified B group vitamins: Secondary | ICD-10-CM

## 2024-12-04 MED ORDER — CYANOCOBALAMIN 1000 MCG/ML IJ SOLN
1000.0000 ug | Freq: Once | INTRAMUSCULAR | Status: AC
  Administered 2024-12-04: 1000 ug via INTRAMUSCULAR

## 2024-12-04 NOTE — Progress Notes (Signed)
 Pt presented for their vitamin B12 injection. Pt was identified through two identifiers. Pt tolerated shot well in their left deltoid.

## 2025-01-08 ENCOUNTER — Ambulatory Visit

## 2025-01-15 ENCOUNTER — Ambulatory Visit

## 2025-07-09 ENCOUNTER — Ambulatory Visit
